# Patient Record
Sex: Female | Born: 1950 | Race: Black or African American | Hispanic: No | Smoking: Never smoker
Health system: Southern US, Community
[De-identification: ages and names within clinical notes are randomized; demographics above are authoritative.]

## PROBLEM LIST (undated history)

## (undated) DIAGNOSIS — Z9289 Personal history of other medical treatment: Secondary | ICD-10-CM

## (undated) DIAGNOSIS — Z973 Presence of spectacles and contact lenses: Secondary | ICD-10-CM

## (undated) DIAGNOSIS — I10 Essential (primary) hypertension: Secondary | ICD-10-CM

## (undated) DIAGNOSIS — Z972 Presence of dental prosthetic device (complete) (partial): Secondary | ICD-10-CM

## (undated) DIAGNOSIS — I447 Left bundle-branch block, unspecified: Secondary | ICD-10-CM

## (undated) DIAGNOSIS — D649 Anemia, unspecified: Secondary | ICD-10-CM

## (undated) DIAGNOSIS — E669 Obesity, unspecified: Secondary | ICD-10-CM

## (undated) DIAGNOSIS — R600 Localized edema: Secondary | ICD-10-CM

## (undated) DIAGNOSIS — I350 Nonrheumatic aortic (valve) stenosis: Secondary | ICD-10-CM

## (undated) DIAGNOSIS — E785 Hyperlipidemia, unspecified: Secondary | ICD-10-CM

## (undated) DIAGNOSIS — E039 Hypothyroidism, unspecified: Secondary | ICD-10-CM

## (undated) DIAGNOSIS — E010 Iodine-deficiency related diffuse (endemic) goiter: Secondary | ICD-10-CM

## (undated) HISTORY — PX: LASIK: SHX215

## (undated) HISTORY — DX: Presence of dental prosthetic device (complete) (partial): Z97.2

## (undated) HISTORY — PX: CARPAL TUNNEL RELEASE: SHX101

## (undated) HISTORY — DX: Personal history of other medical treatment: Z92.89

## (undated) HISTORY — DX: Hyperlipidemia, unspecified: E78.5

## (undated) HISTORY — DX: Localized edema: R60.0

## (undated) HISTORY — DX: Left bundle-branch block, unspecified: I44.7

## (undated) HISTORY — DX: Hypothyroidism, unspecified: E03.9

## (undated) HISTORY — DX: Nonrheumatic aortic (valve) stenosis: I35.0

## (undated) HISTORY — DX: Essential (primary) hypertension: I10

## (undated) HISTORY — DX: Obesity, unspecified: E66.9

## (undated) HISTORY — DX: Anemia, unspecified: D64.9

## (undated) HISTORY — DX: Presence of spectacles and contact lenses: Z97.3

## (undated) HISTORY — DX: Iodine-deficiency related diffuse (endemic) goiter: E01.0

## (undated) HISTORY — PX: TRANSTHORACIC ECHOCARDIOGRAM: SHX275

---

## 1999-11-12 ENCOUNTER — Ambulatory Visit (HOSPITAL_BASED_OUTPATIENT_CLINIC_OR_DEPARTMENT_OTHER): Admission: RE | Admit: 1999-11-12 | Discharge: 1999-11-12 | Payer: Self-pay | Admitting: Orthopedic Surgery

## 1999-11-26 ENCOUNTER — Encounter: Admission: RE | Admit: 1999-11-26 | Discharge: 2000-02-24 | Payer: Self-pay | Admitting: Orthopedic Surgery

## 2000-09-23 ENCOUNTER — Other Ambulatory Visit: Admission: RE | Admit: 2000-09-23 | Discharge: 2000-09-23 | Payer: Self-pay | Admitting: Family Medicine

## 2001-03-10 ENCOUNTER — Emergency Department (HOSPITAL_COMMUNITY): Admission: EM | Admit: 2001-03-10 | Discharge: 2001-03-10 | Payer: Self-pay | Admitting: Emergency Medicine

## 2002-03-23 ENCOUNTER — Emergency Department (HOSPITAL_COMMUNITY): Admission: EM | Admit: 2002-03-23 | Discharge: 2002-03-23 | Payer: Self-pay | Admitting: Emergency Medicine

## 2002-09-26 ENCOUNTER — Ambulatory Visit (HOSPITAL_COMMUNITY): Admission: RE | Admit: 2002-09-26 | Discharge: 2002-09-26 | Payer: Self-pay | Admitting: Obstetrics

## 2002-09-26 ENCOUNTER — Encounter: Payer: Self-pay | Admitting: Obstetrics

## 2003-11-07 ENCOUNTER — Ambulatory Visit (HOSPITAL_COMMUNITY): Admission: RE | Admit: 2003-11-07 | Discharge: 2003-11-07 | Payer: Self-pay | Admitting: *Deleted

## 2005-01-19 ENCOUNTER — Ambulatory Visit (HOSPITAL_COMMUNITY): Admission: RE | Admit: 2005-01-19 | Discharge: 2005-01-19 | Payer: Self-pay | Admitting: Family Medicine

## 2007-06-24 ENCOUNTER — Emergency Department (HOSPITAL_COMMUNITY): Admission: EM | Admit: 2007-06-24 | Discharge: 2007-06-24 | Payer: Self-pay | Admitting: Emergency Medicine

## 2007-06-25 ENCOUNTER — Ambulatory Visit (HOSPITAL_COMMUNITY): Admission: RE | Admit: 2007-06-25 | Discharge: 2007-06-25 | Payer: Self-pay | Admitting: Emergency Medicine

## 2007-06-25 ENCOUNTER — Ambulatory Visit: Payer: Self-pay | Admitting: Surgery

## 2007-06-25 ENCOUNTER — Encounter (INDEPENDENT_AMBULATORY_CARE_PROVIDER_SITE_OTHER): Payer: Self-pay | Admitting: Emergency Medicine

## 2007-07-13 ENCOUNTER — Encounter (INDEPENDENT_AMBULATORY_CARE_PROVIDER_SITE_OTHER): Payer: Self-pay | Admitting: Emergency Medicine

## 2007-07-13 ENCOUNTER — Ambulatory Visit: Payer: Self-pay | Admitting: Vascular Surgery

## 2007-07-13 ENCOUNTER — Emergency Department (HOSPITAL_COMMUNITY): Admission: EM | Admit: 2007-07-13 | Discharge: 2007-07-13 | Payer: Self-pay | Admitting: Emergency Medicine

## 2007-07-15 ENCOUNTER — Encounter (HOSPITAL_BASED_OUTPATIENT_CLINIC_OR_DEPARTMENT_OTHER): Admission: RE | Admit: 2007-07-15 | Discharge: 2007-09-08 | Payer: Self-pay | Admitting: Surgery

## 2007-08-09 ENCOUNTER — Ambulatory Visit: Payer: Self-pay | Admitting: Vascular Surgery

## 2007-09-26 ENCOUNTER — Ambulatory Visit: Payer: Self-pay | Admitting: Vascular Surgery

## 2007-10-04 ENCOUNTER — Ambulatory Visit: Payer: Self-pay | Admitting: Vascular Surgery

## 2008-02-24 DIAGNOSIS — I350 Nonrheumatic aortic (valve) stenosis: Secondary | ICD-10-CM

## 2008-02-24 HISTORY — PX: VEIN SURGERY: SHX48

## 2008-02-24 HISTORY — DX: Nonrheumatic aortic (valve) stenosis: I35.0

## 2008-04-20 ENCOUNTER — Ambulatory Visit: Payer: Self-pay | Admitting: Family Medicine

## 2008-04-23 ENCOUNTER — Ambulatory Visit (HOSPITAL_COMMUNITY): Admission: RE | Admit: 2008-04-23 | Discharge: 2008-04-23 | Payer: Self-pay | Admitting: Family Medicine

## 2008-04-23 DIAGNOSIS — E010 Iodine-deficiency related diffuse (endemic) goiter: Secondary | ICD-10-CM

## 2008-04-23 HISTORY — DX: Iodine-deficiency related diffuse (endemic) goiter: E01.0

## 2008-04-26 ENCOUNTER — Ambulatory Visit: Payer: Self-pay | Admitting: Family Medicine

## 2008-05-01 ENCOUNTER — Ambulatory Visit: Payer: Self-pay | Admitting: Cardiology

## 2008-05-01 ENCOUNTER — Encounter: Payer: Self-pay | Admitting: Cardiology

## 2008-05-01 DIAGNOSIS — R072 Precordial pain: Secondary | ICD-10-CM | POA: Insufficient documentation

## 2008-05-01 DIAGNOSIS — I10 Essential (primary) hypertension: Secondary | ICD-10-CM | POA: Insufficient documentation

## 2008-05-01 DIAGNOSIS — I447 Left bundle-branch block, unspecified: Secondary | ICD-10-CM | POA: Insufficient documentation

## 2008-05-01 DIAGNOSIS — E7800 Pure hypercholesterolemia, unspecified: Secondary | ICD-10-CM | POA: Insufficient documentation

## 2008-05-01 DIAGNOSIS — R011 Cardiac murmur, unspecified: Secondary | ICD-10-CM | POA: Insufficient documentation

## 2008-05-07 ENCOUNTER — Ambulatory Visit: Payer: Self-pay | Admitting: Vascular Surgery

## 2008-05-14 ENCOUNTER — Encounter: Payer: Self-pay | Admitting: Cardiology

## 2008-05-14 ENCOUNTER — Ambulatory Visit: Payer: Self-pay

## 2008-06-19 DIAGNOSIS — I83009 Varicose veins of unspecified lower extremity with ulcer of unspecified site: Secondary | ICD-10-CM | POA: Insufficient documentation

## 2008-06-19 DIAGNOSIS — L97909 Non-pressure chronic ulcer of unspecified part of unspecified lower leg with unspecified severity: Secondary | ICD-10-CM

## 2008-06-19 DIAGNOSIS — E039 Hypothyroidism, unspecified: Secondary | ICD-10-CM | POA: Insufficient documentation

## 2008-07-30 ENCOUNTER — Ambulatory Visit: Payer: Self-pay | Admitting: Vascular Surgery

## 2008-08-06 ENCOUNTER — Ambulatory Visit: Payer: Self-pay | Admitting: Vascular Surgery

## 2009-02-05 ENCOUNTER — Ambulatory Visit: Payer: Self-pay | Admitting: Vascular Surgery

## 2009-04-19 ENCOUNTER — Ambulatory Visit: Payer: Self-pay | Admitting: Family Medicine

## 2009-04-24 ENCOUNTER — Ambulatory Visit: Payer: Self-pay | Admitting: Family Medicine

## 2009-07-15 ENCOUNTER — Encounter: Admission: RE | Admit: 2009-07-15 | Discharge: 2009-07-15 | Payer: Self-pay | Admitting: Gastroenterology

## 2009-07-23 ENCOUNTER — Ambulatory Visit: Payer: Self-pay | Admitting: Family Medicine

## 2009-08-09 ENCOUNTER — Ambulatory Visit: Payer: Self-pay | Admitting: Cardiology

## 2009-08-13 ENCOUNTER — Encounter: Payer: Self-pay | Admitting: Cardiology

## 2009-08-13 ENCOUNTER — Inpatient Hospital Stay (HOSPITAL_COMMUNITY): Admission: RE | Admit: 2009-08-13 | Discharge: 2009-08-18 | Payer: Self-pay | Admitting: General Surgery

## 2009-08-13 ENCOUNTER — Encounter (INDEPENDENT_AMBULATORY_CARE_PROVIDER_SITE_OTHER): Payer: Self-pay | Admitting: General Surgery

## 2009-08-13 HISTORY — PX: COLECTOMY: SHX59

## 2009-11-25 ENCOUNTER — Ambulatory Visit: Payer: Self-pay | Admitting: Family Medicine

## 2009-12-28 IMAGING — US US SOFT TISSUE HEAD/NECK
1 series · 14 of 25 positions shown · non-contrast
Comparison: None.

CLINICAL DATA: Thyromegaly.

THYROID ULTRASOUND
TECHNIQUE: Ultrasound examination of the thyroid gland and
adjacent soft tissues was performed.

[Series 1: us soft tissue head/neck · 0.11mm/px · 14 of 32 slices shown]
[im 1/32]
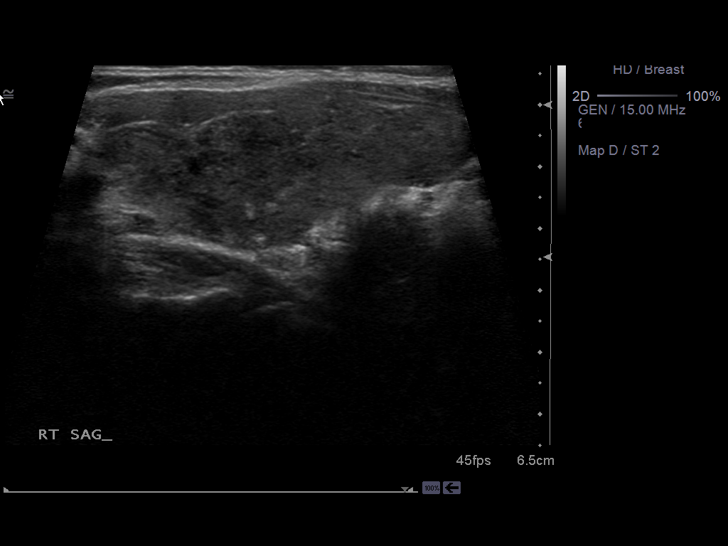
[im 3/32]
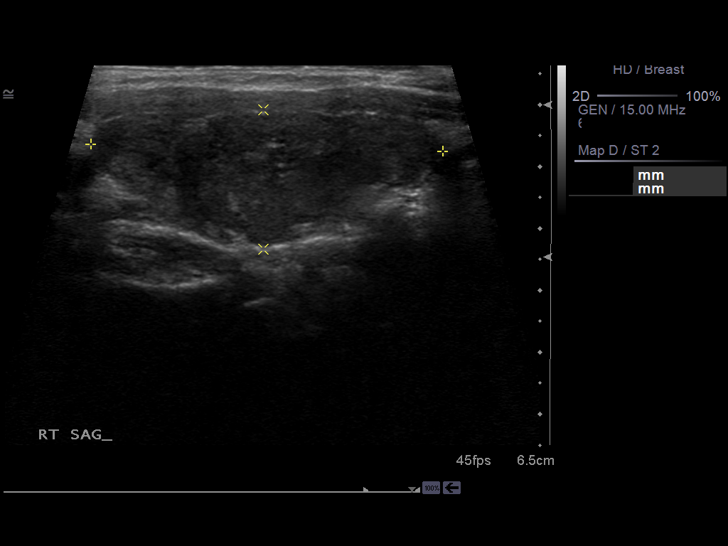
[im 6/32]
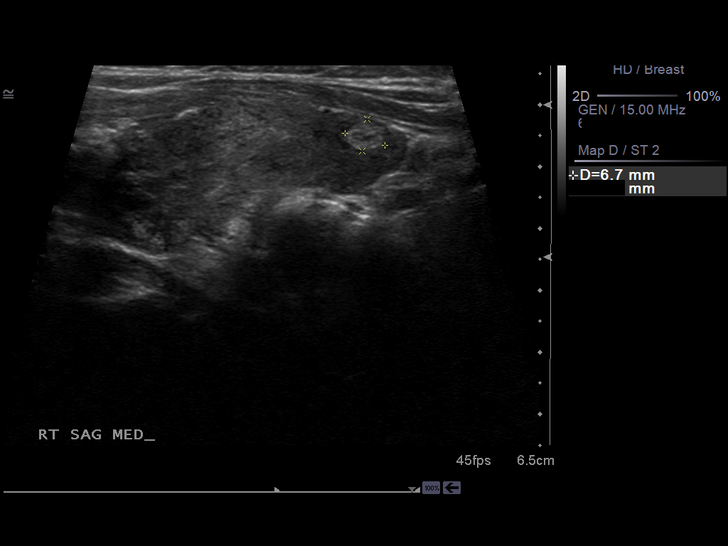
[im 8/32]
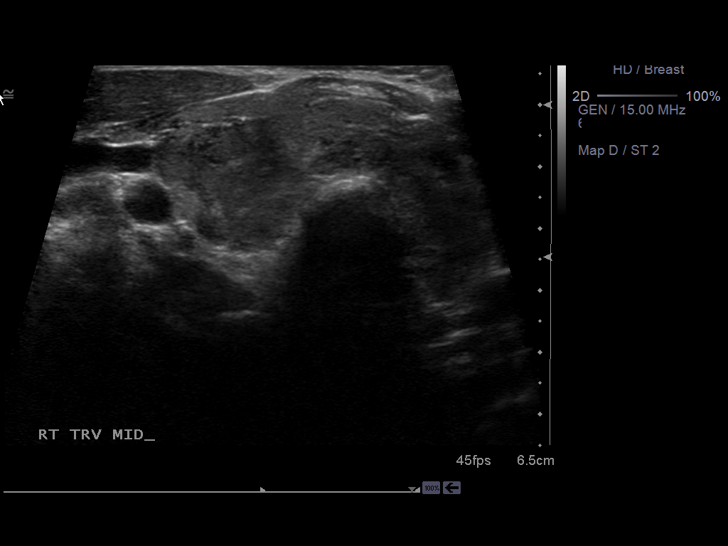
[im 11/32]
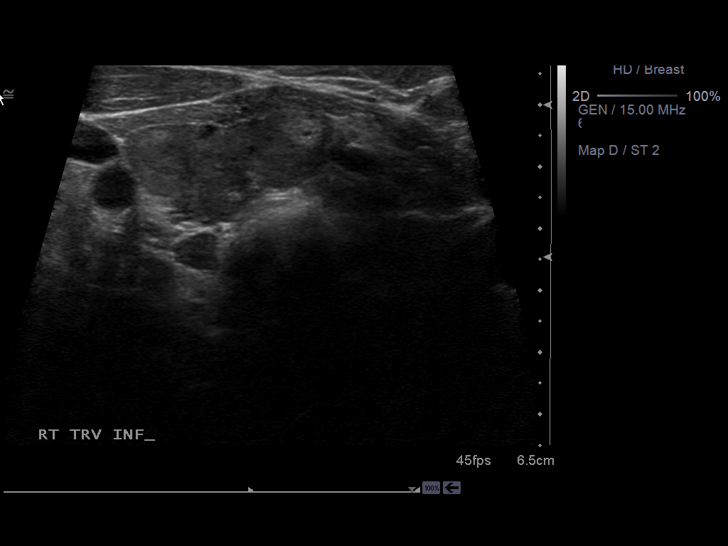
[im 12/32]
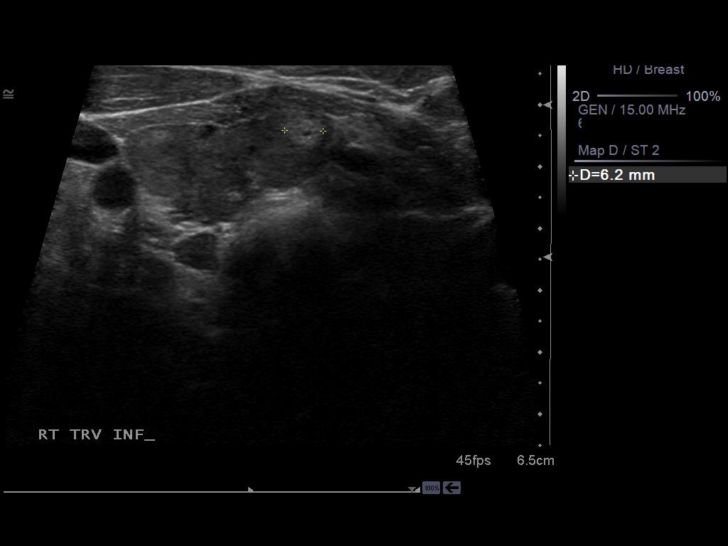
[im 15/32]
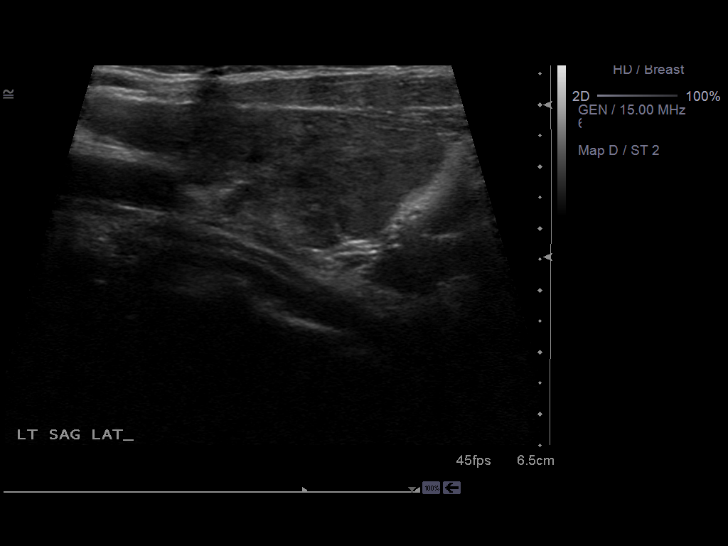
[im 17/32]
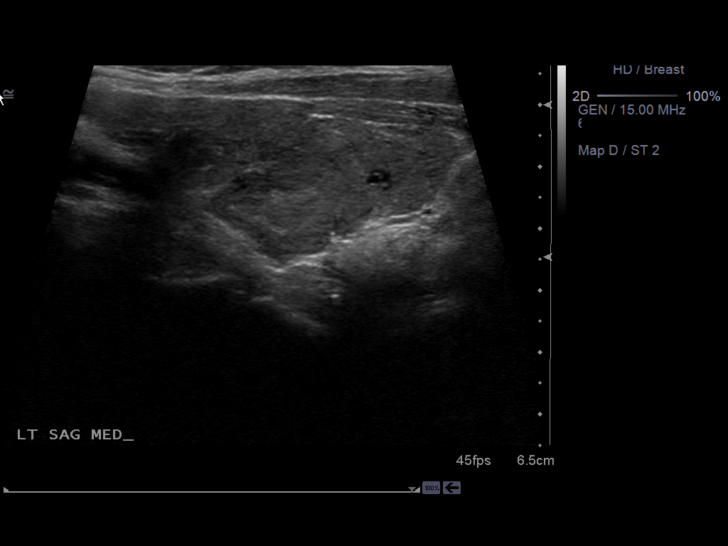
[im 20/32]
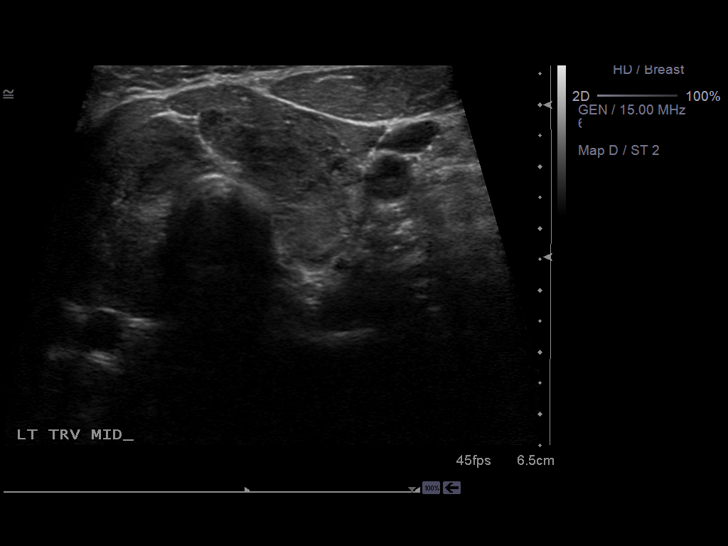
[im 21/32]
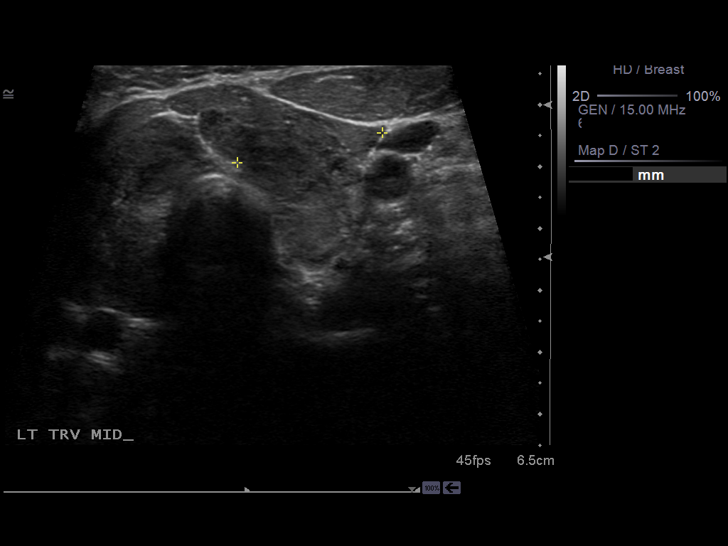
[im 24/32]
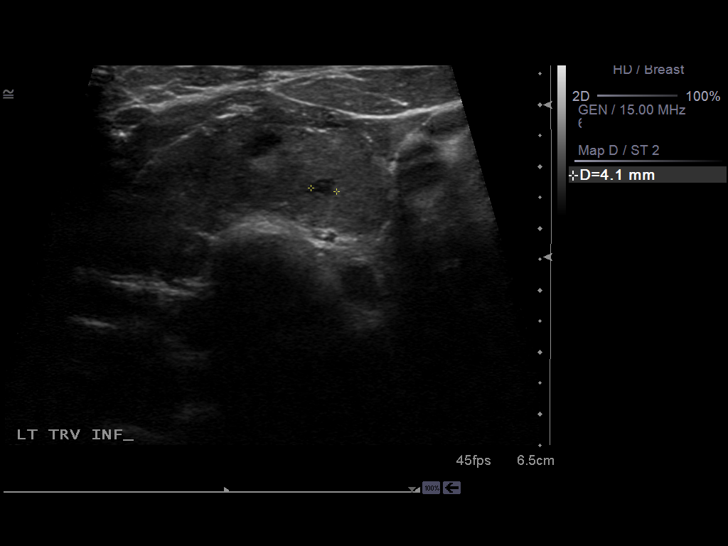
[im 26/32]
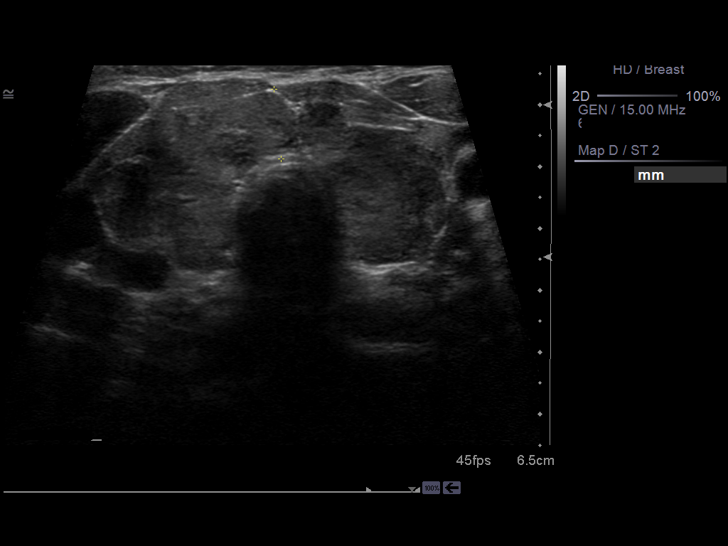
[im 29/32]
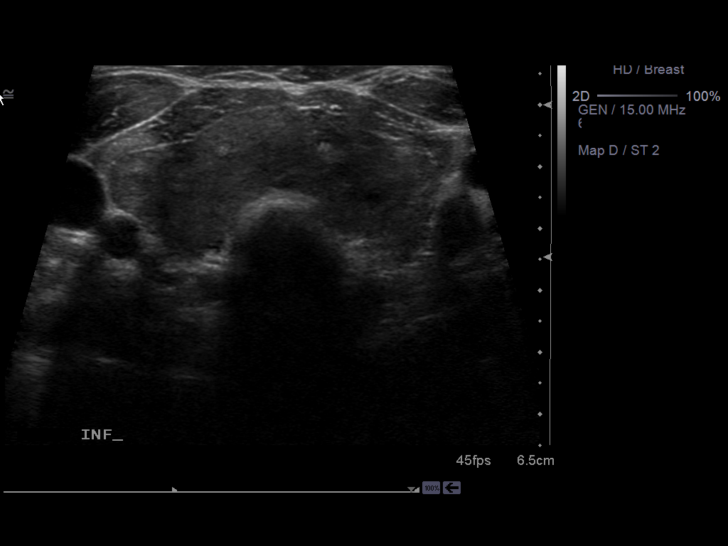
[im 32/32]
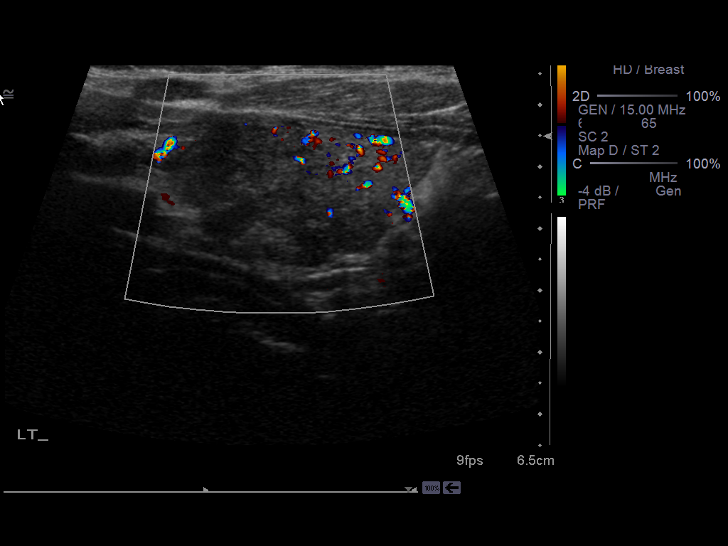

[14 of 25 positions shown; findings below may reference images not displayed]

FINDINGS: Right lobe of the thyroid gland measures 5.6 x 2.3 x 2.7 cm.

Left lobe of the thyroid gland measures 4.6 x 2.5 x 2.4 cm.

Diffusely heterogeneous echotexture of the thyroid gland.

Within the inferior medial aspect of the right lobe of the thyroid
gland is an echogenic 6.7 x 5.2 x 6.2 mm structure.

Within the inferior aspect of the left lobe of the thyroid gland is
a complex 4.6 x 3.4 x 4.1 cystic-appearing structure.

To help confirm stability of these findings, follow-up imaging in 6
months recommended.

Isthmus measures 1.5 cm.
IMPRESSION: Enlarged heterogeneous thyroid gland.  Dominant sub centimeter
lesion within the inferior aspect of each lobe of thyroid gland as
noted above.  Stability can be confirmed on follow-up in 6 months.

## 2010-03-16 ENCOUNTER — Encounter: Payer: Self-pay | Admitting: Family Medicine

## 2010-03-25 NOTE — Assessment & Plan Note (Signed)
History of Present Illness: 60 year old female who I am asked to evaluate for left bundle branch block and murmur. The patient has been told she's had a murmur in the past. She has dyspnea with more extreme activities but not with routine activities. There is no orthopnea or PND. She occasionally has mild edema in the right lower extremity from venous insufficiency but not in her left lower extremity. She also occasionally has a pain in her chest and left breast area. It is described as a "staining sensation". It is not exertional nor is it pleuritic or positional. It is not related to food. There is no associated nausea vomiting, shortness of breath or diaphoresis. It lasts for one minute and resolve spontaneously. She does not have exertional chest pain. She was recently noted to have a murmur on examination and left bundle branch block on her electrocardiogram and we were asked to further evaluate.  Current Medications (verified): 1)  Lisinopril-Hydrochlorothiazide 10-12.5 Mg Tabs (Lisinopril-Hydrochlorothiazide) .Marland Kitchen.. 1 Tab By Mouth Once Daily 2)  Levothyroxine Sodium 100 Mcg Tabs (Levothyroxine Sodium) .Marland Kitchen.. 1 Tab By Mouth Once Daily  Past Medical History:    Borderline DM    Hypertension    Hypothyroid    Murmur  Past Surgical History:    Carpel tunnel surgery bilaterally    Vein surgery RLE    Tonsillectomy  Family History:    Sisters with murmur    Mother died of MI at age 46    Father died of cancer  Social History:    Married     Tobacco Use - No.     Alcohol Use - no    5 children    employed by [employer]    Smoking Status:  never  Review of Systems       Patient denies fevers, chills, productive cough, hemoptysis, dysphasia, odynophagia, melena, hematochezia, dysuria, hematuria. She does have edema in the right lower extremity which is chronic and due to venous insufficiency. The remaining systems are negative.  Vital Signs:  Patient profile:   60 year old female  Weight:      261 pounds Pulse rate:   84 / minute Pulse rhythm:   regular BP sitting:   113 / 77  (left arm)  Vitals Entered By: Kem Parkinson (May 01, 2008 2:39 PM)  Problems:  Medical Problems Added: 1)  Dx of Pure Hypercholesterolemia  (ICD-272.0) 2)  Dx of Essential Hypertension, Benign  (ICD-401.1) 3)  Dx of Chest Pain, Precordial  (ICD-786.51) 4)  Dx of Lbbb  (ICD-426.3) 5)  Dx of Murmur  (ICD-785.2)  Physical Exam  General:  Well developed/obese in no acute distress Skin warm/dry Patient not depressed No peripheral clubbing Back-normal HEENT-normal/normal eyelids Neck supple/normal carotid upstroke bilaterally; no bruits; no JVD; no thyromegaly chest - CTA/ normal expansion CV - RRR/normal S1 and S2; 2/6 systolic murmur radiating to the carotids,no rubs or gallops; no rub; PMI nondisplaced Abdomen -NT/ND, no HSM, no mass, + bowel sounds, no bruit 2+ femoral pulses, no bruits Ext-1+ edema right lower extremity, no chords, 2+ DP Neuro-grossly nonfocal     EKG  Procedure date:  05/01/2008  Findings:      normal sinus rhythm with left bundle branch block.  Impression & Recommendations:  Problem # 1:  MURMUR (ICD-785.2) The patient appears to have an aortic stenosis murmur on examination. However it does sound mild. We will plan to proceed with an echocardiogram to more fully evaluate. Her updated medication  list for this problem includes:    Lisinopril-hydrochlorothiazide 10-12.5 Mg Tabs (Lisinopril-hydrochlorothiazide) .Marland Kitchen... 1 tab by mouth once daily  Problem # 2:  CHEST PAIN, PRECORDIAL (ICD-786.51) The patient's chest pain is atypical. We will schedule her to have a Myoview for risk stratification Her updated medication list for this problem includes:    Lisinopril-hydrochlorothiazide 10-12.5 Mg Tabs (Lisinopril-hydrochlorothiazide) .Marland Kitchen... 1 tab by mouth once daily  Problem # 3:  ESSENTIAL HYPERTENSION, BENIGN (ICD-401.1) The patient's blood pressure  is controlled on her present medications. Her updated medication list for this problem includes:    Lisinopril-hydrochlorothiazide 10-12.5 Mg Tabs (Lisinopril-hydrochlorothiazide) .Marland Kitchen... 1 tab by mouth once daily  Problem # 4:  PURE HYPERCHOLESTEROLEMIA (ICD-272.0) Management per her primary care physician.  Problem # 5:  LBBB (ICD-426.3) We are scheduling a Myoview for risk stratification. Her updated medication list for this problem includes:    Lisinopril-hydrochlorothiazide 10-12.5 Mg Tabs (Lisinopril-hydrochlorothiazide) .Marland Kitchen... 1 tab by mouth once daily  Patient Instructions: 1)  The patient will followup with me in 6 weeks.

## 2010-03-25 NOTE — Letter (Signed)
Summary: [facility] - Surgical Instructions  [facility] - Surgical Instructions   Imported By: Marylou Mccoy 09/03/2009 09:18:56  _____________________________________________________________________  External Attachment:    Type:   Image     Comment:   External Document

## 2010-03-25 NOTE — Assessment & Plan Note (Signed)
Summary: surgical clearance pt of Kathryn [name]   Visit Type:  Pre-op Evaluation Primary Provider:  dr Susann Givens   History of Present Illness: The patient is 60 years old and comes in for preoperative evaluation prior to colon surgery next week. She apparently has a polyp that is to be to be removed endoscopically. She is scheduled for laparoscopic surgery by Dr. Andrey Campanile next week.  She has been seen by Dr. Jens Som in the past. She was evaluated for a heart murmur and had what was thought to be atypical chest pain. She had an echocardiogram which showed an ejection fraction of 60% and a mean aortic valve gradient of 7 mm of mercury.  Her other medical problems include hypertension and hyperlipidemia although she's not been on any statin therapy. She has had no recent chest pain. He has had some mild palpitations.  Current Medications (verified): 1)  Lisinopril-Hydrochlorothiazide 10-12.5 Mg Tabs (Lisinopril-Hydrochlorothiazide) .Marland Kitchen.. 1 Tab By Mouth Once Daily 2)  Levothyroxine Sodium 100 Mcg Tabs (Levothyroxine Sodium) .Marland Kitchen.. 1 Tab By Mouth Once Daily 3)  Mupirocin 2 % Oint (Mupirocin) .... Both Nostrils Twice A Day  Allergies (verified): 1)  ! Codeine  Past History:  Past Medical History: Reviewed history from 06/19/2008 and no changes required. Current Problems:  MURMUR (ICD-785.2) CHEST PAIN, PRECORDIAL (ICD-786.51) ESSENTIAL HYPERTENSION, BENIGN (ICD-401.1) STASIS ULCER (ICD-454.0) HYPOTHYROIDISM (ICD-244.9) PURE HYPERCHOLESTEROLEMIA (ICD-272.0) LBBB (ICD-426.3)  Borderline DM hyperpigmentation ulceration and pain in the leg prior to her  procedure.  Cellulitis of  swelling   right lower leg   Review of Systems       ROS is negative except as outlined in HPI.   Vital Signs:  Patient profile:   60 year old female Height:      67 inches Weight:      232 pounds BMI:     36.47 Pulse rate:   71 / minute BP sitting:   122 / 76  (left arm) Cuff size:   large  Vitals Entered  By: Burnett Kanaris, CNA (August 09, 2009 4:49 PM)  Physical Exam  Additional Exam:  Gen. Well-nourished, in no distress Head: Normocephalic    Eyes: PERRLA/EOM intact; conjunctiva and lids normal Neck: No JVD, thyroid not enlarged, no carotid bruits Lungs: No tachypnea, clear w/o rales, rhonchi or wheezes CV: Rhythm regular, PMI not displaced,  sounds  nl, no murmurs or gallops, 1+ bilateral lower extremity edema with some stasis changes, pulses nl UE and LE Abd: BS nl, abd soft and non-tender w/o masses or hepatosplenomegaly MS: No deformities Neuro: no focal sns Skin: no lesions Psych: nl affect    Impression & Recommendations:  Problem # 1:  PREOPERATIVE EXAMINATION (ICD-V72.84) She is scheduled for laparoscopic partial colectomy next week. She has very mild aortic stenosis. She also has hypertension. There is no evidence of ischemic heart disease and she's not having any chest pain. I think her cardiac risk for surgery should be low and I think it is reasonable to proceed.  Her ECG shows left bundle branch block.  Problem # 2:  MURMUR (ICD-785.2) She has very mild aortic stenosis. I don't think she needs any treatment or special precautions regarding this. Her updated medication list for this problem includes:    Lisinopril-hydrochlorothiazide 10-12.5 Mg Tabs (Lisinopril-hydrochlorothiazide) .Marland Kitchen... 1 tab by mouth once daily  Her updated medication list for this problem includes:    Lisinopril-hydrochlorothiazide 10-12.5 Mg Tabs (Lisinopril-hydrochlorothiazide) .Marland Kitchen... 1 tab by mouth once daily  Problem # 3:  ESSENTIAL HYPERTENSION, BENIGN (  ICD-401.1) Her blood pressure is well controlled on current medications. Her updated medication list for this problem includes:    Lisinopril-hydrochlorothiazide 10-12.5 Mg Tabs (Lisinopril-hydrochlorothiazide) .Marland Kitchen... 1 tab by mouth once daily  Her updated medication list for this problem includes:    Lisinopril-hydrochlorothiazide 10-12.5 Mg  Tabs (Lisinopril-hydrochlorothiazide) .Marland Kitchen... 1 tab by mouth once daily  Other Orders: EKG w/ Interpretation (93000)  Patient Instructions: 1)  Your physician recommends that you schedule a follow-up appointment in: 12 months with Dr. Jens Som. The office will mail you a reminder letter 2 months prior appointment ndate.

## 2010-05-11 LAB — TYPE AND SCREEN
ABO/RH(D): B POS
Antibody Screen: NEGATIVE

## 2010-05-11 LAB — GLUCOSE, CAPILLARY
Glucose-Capillary: 100 mg/dL — ABNORMAL HIGH (ref 70–99)
Glucose-Capillary: 102 mg/dL — ABNORMAL HIGH (ref 70–99)
Glucose-Capillary: 108 mg/dL — ABNORMAL HIGH (ref 70–99)
Glucose-Capillary: 109 mg/dL — ABNORMAL HIGH (ref 70–99)
Glucose-Capillary: 111 mg/dL — ABNORMAL HIGH (ref 70–99)
Glucose-Capillary: 112 mg/dL — ABNORMAL HIGH (ref 70–99)
Glucose-Capillary: 112 mg/dL — ABNORMAL HIGH (ref 70–99)
Glucose-Capillary: 112 mg/dL — ABNORMAL HIGH (ref 70–99)
Glucose-Capillary: 113 mg/dL — ABNORMAL HIGH (ref 70–99)
Glucose-Capillary: 114 mg/dL — ABNORMAL HIGH (ref 70–99)
Glucose-Capillary: 114 mg/dL — ABNORMAL HIGH (ref 70–99)
Glucose-Capillary: 117 mg/dL — ABNORMAL HIGH (ref 70–99)
Glucose-Capillary: 118 mg/dL — ABNORMAL HIGH (ref 70–99)
Glucose-Capillary: 119 mg/dL — ABNORMAL HIGH (ref 70–99)
Glucose-Capillary: 119 mg/dL — ABNORMAL HIGH (ref 70–99)
Glucose-Capillary: 121 mg/dL — ABNORMAL HIGH (ref 70–99)
Glucose-Capillary: 121 mg/dL — ABNORMAL HIGH (ref 70–99)
Glucose-Capillary: 123 mg/dL — ABNORMAL HIGH (ref 70–99)
Glucose-Capillary: 127 mg/dL — ABNORMAL HIGH (ref 70–99)
Glucose-Capillary: 127 mg/dL — ABNORMAL HIGH (ref 70–99)
Glucose-Capillary: 130 mg/dL — ABNORMAL HIGH (ref 70–99)
Glucose-Capillary: 133 mg/dL — ABNORMAL HIGH (ref 70–99)
Glucose-Capillary: 140 mg/dL — ABNORMAL HIGH (ref 70–99)
Glucose-Capillary: 145 mg/dL — ABNORMAL HIGH (ref 70–99)
Glucose-Capillary: 147 mg/dL — ABNORMAL HIGH (ref 70–99)
Glucose-Capillary: 151 mg/dL — ABNORMAL HIGH (ref 70–99)
Glucose-Capillary: 157 mg/dL — ABNORMAL HIGH (ref 70–99)
Glucose-Capillary: 94 mg/dL (ref 70–99)
Glucose-Capillary: 94 mg/dL (ref 70–99)

## 2010-05-11 LAB — ABO/RH: ABO/RH(D): B POS

## 2010-05-11 LAB — BASIC METABOLIC PANEL
BUN: 5 mg/dL — ABNORMAL LOW (ref 6–23)
BUN: 5 mg/dL — ABNORMAL LOW (ref 6–23)
BUN: 6 mg/dL (ref 6–23)
BUN: 7 mg/dL (ref 6–23)
CO2: 25 mEq/L (ref 19–32)
CO2: 27 mEq/L (ref 19–32)
CO2: 27 mEq/L (ref 19–32)
CO2: 28 mEq/L (ref 19–32)
Calcium: 8.6 mg/dL (ref 8.4–10.5)
Calcium: 8.6 mg/dL (ref 8.4–10.5)
Calcium: 8.9 mg/dL (ref 8.4–10.5)
Calcium: 9.2 mg/dL (ref 8.4–10.5)
Chloride: 105 mEq/L (ref 96–112)
Chloride: 108 mEq/L (ref 96–112)
Chloride: 108 mEq/L (ref 96–112)
Chloride: 108 mEq/L (ref 96–112)
Creatinine, Ser: 0.72 mg/dL (ref 0.4–1.2)
Creatinine, Ser: 0.73 mg/dL (ref 0.4–1.2)
Creatinine, Ser: 0.76 mg/dL (ref 0.4–1.2)
Creatinine, Ser: 0.83 mg/dL (ref 0.4–1.2)
GFR calc Af Amer: >60 mL/min (ref >60)
GFR calc Af Amer: >60 mL/min (ref >60)
GFR calc Af Amer: >60 mL/min (ref >60)
GFR calc Af Amer: >60 mL/min (ref >60)
GFR calc non Af Amer: >60 mL/min (ref >60)
GFR calc non Af Amer: >60 mL/min (ref >60)
GFR calc non Af Amer: >60 mL/min (ref >60)
GFR calc non Af Amer: >60 mL/min (ref >60)
Glucose, Bld: 112 mg/dL — ABNORMAL HIGH (ref 70–99)
Glucose, Bld: 113 mg/dL — ABNORMAL HIGH (ref 70–99)
Glucose, Bld: 129 mg/dL — ABNORMAL HIGH (ref 70–99)
Glucose, Bld: 138 mg/dL — ABNORMAL HIGH (ref 70–99)
Potassium: 3.5 mEq/L (ref 3.5–5.1)
Potassium: 3.5 mEq/L (ref 3.5–5.1)
Potassium: 3.7 mEq/L (ref 3.5–5.1)
Potassium: 3.9 mEq/L (ref 3.5–5.1)
Sodium: 138 mEq/L (ref 135–145)
Sodium: 139 mEq/L (ref 135–145)
Sodium: 141 mEq/L (ref 135–145)
Sodium: 141 mEq/L (ref 135–145)

## 2010-05-11 LAB — CBC
HCT: 28.4 % — ABNORMAL LOW (ref 36.0–46.0)
HCT: 28.6 % — ABNORMAL LOW (ref 36.0–46.0)
HCT: 35.7 % — ABNORMAL LOW (ref 36.0–46.0)
Hemoglobin: 11.2 g/dL — ABNORMAL LOW (ref 12.0–15.0)
Hemoglobin: 9 g/dL — ABNORMAL LOW (ref 12.0–15.0)
Hemoglobin: 9.1 g/dL — ABNORMAL LOW (ref 12.0–15.0)
MCH: 26.2 pg (ref 26.0–34.0)
MCHC: 31.4 g/dL (ref 30.0–36.0)
MCHC: 31.6 g/dL (ref 30.0–36.0)
MCHC: 31.7 g/dL (ref 30.0–36.0)
MCV: 82.4 fL (ref 78.0–100.0)
MCV: 83.1 fL (ref 78.0–100.0)
MCV: 83.3 fL (ref 78.0–100.0)
Platelets: 173 10*3/uL (ref 150–400)
Platelets: 193 10*3/uL (ref 150–400)
Platelets: 196 10*3/uL (ref 150–400)
RBC: 3.41 MIL/uL — ABNORMAL LOW (ref 3.87–5.11)
RBC: 3.47 MIL/uL — ABNORMAL LOW (ref 3.87–5.11)
RBC: 4.29 MIL/uL (ref 3.87–5.11)
RDW: 14.3 % (ref 11.5–15.5)
RDW: 14.3 % (ref 11.5–15.5)
RDW: 14.6 % (ref 11.5–15.5)
WBC: 6.6 10*3/uL (ref 4.0–10.5)
WBC: 6.6 10*3/uL (ref 4.0–10.5)
WBC: 9.2 10*3/uL (ref 4.0–10.5)

## 2010-05-11 LAB — MAGNESIUM
Magnesium: 1.9 mg/dL (ref 1.5–2.5)
Magnesium: 2 mg/dL (ref 1.5–2.5)

## 2010-05-11 LAB — HEMOGLOBIN A1C
Hgb A1c MFr Bld: 6.3 % — ABNORMAL HIGH (ref <5.7)
Mean Plasma Glucose: 134 mg/dL — ABNORMAL HIGH (ref <117)

## 2010-05-12 LAB — COMPREHENSIVE METABOLIC PANEL
ALT: 25 U/L (ref 0–35)
AST: 24 U/L (ref 0–37)
Albumin: 4.2 g/dL (ref 3.5–5.2)
Alkaline Phosphatase: 79 U/L (ref 39–117)
BUN: 12 mg/dL (ref 6–23)
CO2: 30 mEq/L (ref 19–32)
Calcium: 9.9 mg/dL (ref 8.4–10.5)
Chloride: 105 mEq/L (ref 96–112)
Creatinine, Ser: 0.78 mg/dL (ref 0.4–1.2)
GFR calc Af Amer: >60 mL/min (ref >60)
GFR calc non Af Amer: >60 mL/min (ref >60)
Glucose, Bld: 86 mg/dL (ref 70–99)
Potassium: 3.9 mEq/L (ref 3.5–5.1)
Sodium: 141 mEq/L (ref 135–145)
Total Bilirubin: 0.6 mg/dL (ref 0.3–1.2)
Total Protein: 7.8 g/dL (ref 6.0–8.3)

## 2010-05-12 LAB — DIFFERENTIAL
Basophils Absolute: 0 10*3/uL (ref 0.0–0.1)
Basophils Relative: 1 % (ref 0–1)
Eosinophils Absolute: 0.1 10*3/uL (ref 0.0–0.7)
Eosinophils Relative: 2 % (ref 0–5)
Lymphocytes Relative: 47 % — ABNORMAL HIGH (ref 12–46)
Lymphs Abs: 2.3 10*3/uL (ref 0.7–4.0)
Monocytes Absolute: 0.4 10*3/uL (ref 0.1–1.0)
Monocytes Relative: 8 % (ref 3–12)
Neutro Abs: 2.1 10*3/uL (ref 1.7–7.7)
Neutrophils Relative %: 43 % (ref 43–77)

## 2010-05-12 LAB — CBC
HCT: 37.8 % (ref 36.0–46.0)
Hemoglobin: 12.2 g/dL (ref 12.0–15.0)
MCHC: 32.3 g/dL (ref 30.0–36.0)
MCV: 82.3 fL (ref 78.0–100.0)
Platelets: 232 10*3/uL (ref 150–400)
RBC: 4.59 MIL/uL (ref 3.87–5.11)
RDW: 14.2 % (ref 11.5–15.5)
WBC: 4.9 10*3/uL (ref 4.0–10.5)

## 2010-05-12 LAB — SURGICAL PCR SCREEN
MRSA, PCR: POSITIVE — AB
Staphylococcus aureus: POSITIVE — AB

## 2010-07-08 NOTE — Procedures (Signed)
LOWER EXTREMITY VENOUS REFLUX EXAM   INDICATION:  Followup right lower extremity greater saphenous vein  ablation x2.   EXAM:  Using color-flow imaging and pulse Doppler spectral analysis, the  right common femoral, superficial femoral, popliteal, posterior tibial,  greater and lesser saphenous veins were evaluated.  There is no evidence  suggesting deep venous insufficiency in the right lower extremity.   The right saphenofemoral junction is not competent with Reflux of >500  milliseconds. The right GSV is not competent with Reflux of >500  milliseconds in a short segment proximally as well as in calf.  Most of  the thigh area GSV is not visualized status post ablation.   The right proximal short saphenous vein demonstrates competency.   GSV Diameter (used if found to be incompetent only)                                            Right    Left  Proximal Greater Saphenous Vein           0.56 cm  cm  Proximal-to-mid-thigh                     cm       cm  Mid thigh                                 cm       cm  Mid-distal thigh                          cm       cm  Distal thigh                              cm       cm  Knee                                      cm       cm   IMPRESSION:  1. Right greater saphenous vein Reflux with >500 milliseconds is      identified in the visualized segments with the majority of the      thigh segment not visualized due to ablation.  2. The deep venous system is competent.  3. The right lesser saphenous vein is competent.   ___________________________________________  [name]. Hart Rochester, M.D.   AS/MEDQ  D:  02/05/2009  T:  02/06/2009  Job:  [id number]

## 2010-07-08 NOTE — Assessment & Plan Note (Signed)
OFFICE VISIT   Kathryn [name], Kathryn [name]  DOB:  13-Jun-1950                                       10/04/2007  [id number]#:[id number]   The patient had laser ablation of her right great saphenous vein 1 week  ago for severe venous insufficiency of the right leg with history of  stasis ulcers.  She had chronic edema in the right leg and had gross  reflux throughout the right great saphenous system.   She has done well since her procedure.  She had some moderate discomfort  for 24 hours at the ablation site of the thigh in the right leg but  after that the discomfort abated fairly rapidly.  She has had no change  in her distal edema but the aching and throbbing discomfort in the calf  and thigh seems to be improving already.   On examination today there is no edema except for 1+ distally.  She  continues to have the hyperpigmentation in the lower third of the leg  with a heel ulcer.  She has palpable dorsalis pedis pulse 2-3+, no  tenderness over the great saphenous vein.  Venous duplex exam today  reveals total occlusion of the right great saphenous vein from the  saphenofemoral junction to the distal thigh.  I reassured her regarding  these findings and we will see her back on a p.r.n. basis.   [name] [place], M.D.  Electronically Signed   JDL/MEDQ  D:  10/04/2007  T:  10/05/2007  Job:  [id number]

## 2010-07-08 NOTE — Assessment & Plan Note (Signed)
[facility]   NAME:  Kathryn, [name]            ACCOUNT NO.:  [id number]   MEDICAL RECORD NO.:  [id number]      DATE OF BIRTH:  [birth year]   PHYSICIAN:  Jake Shark A. Tanda Rockers, M.D. VISIT DATE:  07/26/2007                                   OFFICE VISIT   SUBJECTIVE:  Kathryn [name] is a 60 year old female who we are treating  for stasis ulcer involving her right medial lower extremity.  In the  interim, she has continued to work.  She is exercising with periodic  elevations of the leg and continues to wear the compression wrap.  She  is complaining of pain in the posterior popliteal area.   OBJECTIVE:  Blood pressure is 140/87, respirations 18, pulse rate 70,  temperature is 98.5.  Inspection of the right lower extremity shows that  there is a healthy eschar covering the entire wound.  There is no  evidence of drainage or malodor.  The chronic changes of stasis are  persistent.  The dorsalis pedis pulses are readily palpable and judged  at 3+.  There is no evidence of ascending infection.   ASSESSMENT:  Near resolved stasis ulcer.   PLAN:  We are referring the patient for vascular consultation for  consideration of vein ablation or stripping of symptomatic varicose  veins.  In addition, we will return her to a Profore wrap with  triamcinolone ointment.  We will reevaluate her in 1 week.  We have  given her a prescription for bilateral 30-40 mm below-the-knee external  compression hose.  We have given the patient opportunity to ask  questions.  She seems to understand these instructions and indicates  that she will be compliant.      [name], M.D.  Electronically Signed     HAN/MEDQ  D:  07/26/2007  T:  07/26/2007  Job:  [id number]

## 2010-07-08 NOTE — Procedures (Signed)
DUPLEX DEEP VENOUS EXAM - LOWER EXTREMITY   INDICATION:  Postop lower extremity venous ablation.   HISTORY:  Edema:  Diabetes:  No.  Trauma/Surgery:  Right greater saphenous vein ablation on 07/30/2008 by  Dr. Hart Rochester.  Pain:  No.  PE:  No.  Previous DVT:  No.  Anticoagulants:  No.  Other:   DUPLEX EXAM:                CFV   SFV   PopV  PTV    GSV                R  L  R  L  R  L  R   L  R  L  Thrombosis    o  o  o     o     o      +  Spontaneous   +  +  +     +     +      0  Phasic        +  +  +     +     +      0  Augmentation  +  +  +     +     +      0  Compressible  +  +  +     +     +      0  Competent     +  +  +     +     +   Legend:  + - yes  o - no  p - partial  D - decreased   IMPRESSION:  1. No evidence of deep vein thrombosis in right lower extremity.  2. Right greater saphenous vein appears ablated from groin to knee      level.    _____________________________  [name]. Hart Rochester, M.D.   AC/MEDQ  D:  08/06/2008  T:  08/06/2008  Job:  [id number]

## 2010-07-08 NOTE — Assessment & Plan Note (Signed)
OFFICE VISIT   Kathryn, MONTE [name]  DOB:  20-Jun-1950                                       02/05/2009  [id number]#:[id number]   [name] is a 60 year old female seen in followup for severe venous  insufficiency of the right leg.  She had initially a laser ablation of  the right great saphenous vein August 2009.  She returned with more  symptoms in June 2010 and had a repeat laser ablation because she has  recanalized her right great saphenous vein and also had stab  phlebectomies performed.  She states that she has been having some  increasing edema in the right ankle and calf as well as darkening of the  skin and was re-evaluated today.  She is wearing elastic compression  stockings to some degree, but not always putting them on early in the  morning apparently.  Left leg has no severe symptoms.   On physical exam, blood pressure 160/80, heart rate 72, respirations 20.  Her lower extremity exam reveals right leg to have very thick  hyperpigmented skin in the lower third of the right leg with no  ulcerations or prominent varicosities.  The right leg has 1+ edema  compared to the left.  Venous duplex exam reveals the deep system to be  normal with no evidence of valvular incompetence or deep venous  obstruction.  She does have total occlusion of the right great saphenous  vein just distal to the junction down to the knee level with some  patency of the great saphenous vein from the knee to the ankle which has  reflux at that point.   I explained to her that closing off the distal great saphenous vein from  the ankle to the knee would not affect her edema and that the  hyperpigmentation is something that has developed over many years and is  not reversible.  I have strongly encouraged her to elevate her legs at  night and wear short leg elastic compression stockings on a daily basis  to hopefully improve her symptoms.  She will return to see [place] on a  p.r.n.  basis.   [name] Rochester, M.D.  Electronically Signed   JDL/MEDQ  D:  02/05/2009  [name]:  02/06/2009  Job:  [id number]

## 2010-07-08 NOTE — Assessment & Plan Note (Signed)
OFFICE VISIT   Kathryn [name], Kathryn [name]  DOB:  Jul 28, 1950                                       05/07/2008  [id number]#:[id number]   [name] underwent laser ablation of the right great saphenous vein  September 26, 1998.  She has a history of stasis ulcers with severe  hyperpigmentation, ulceration and pain in the leg prior to her  procedure.  She has no stab phlebectomies performed.  She returned the  following week and a duplex scan at that time revealed total closure of  her right great saphenous vein to the distal thigh, except for some  trickle flow in one area.  She had improvement in her symptoms for a few  months and then began having increasing discomfort in the right thigh  and calf with increasing prominent varicosities in the distal thigh  posteriorly and the proximal calf posteriorly.  She also developed more  swelling in the right ankle and foot, but no stasis ulcer recurred.  She  had no improvement with her  graduated compression stockings which she  had been wearing before and she continued to try this as a relief of her  symptoms as well as ibuprofen and elevation of the legs.  She returns  today with increasing discomfort in the legs despite these conservative  measures.   On exam she does have some significant hyperpigmentation and thickening  of the skin in the mid to lower calf where the stasis ulcers been  present previously on the right side.  She has 1+ edema distally.  She  has bulging varicosities in the distal thigh and proximal calf  posteriorly.  Venous duplex exam today reveals total recanalization of  her right great saphenous system from the knee to the saphenofemoral  junction with gross reflux throughout feeding these bulging  varicosities.  Deep system is normal.   Her right great saphenous system has recanalized since her procedure was  performed in August 9.  She has severe symptomatology and increasing  bulging varicosities  which are painful and I feel we should proceed with  laser ablation once again to the right great saphenous vein with  slightly higher energy level on this procedure.  Will also perform stab  phlebectomies for the painful varicosities.  Will proceed with pre-  certification for this and perform in the near future.   [name] Rochester, M.D.  Electronically Signed   JDL/MEDQ  D:  05/07/2008  [name]:  05/08/2008  Job:  [id number]

## 2010-07-08 NOTE — Procedures (Signed)
DUPLEX DEEP VENOUS EXAM - LOWER EXTREMITY   INDICATION:  Follow-up evaluation, status post EVLT.   HISTORY:  Edema:  Right leg edema, improved since ablation.  Trauma/Surgery:  Patient had laser ablation of the right greater  saphenous vein on 09/26/07.  Pain:  Mild tingling in the right leg.  PE:  No.  Previous DVT:  No.  Anticoagulants:  No.  Other:  Patient had a right ankle venous ulcer that healed prior to  ablation.   DUPLEX EXAM:                CFV   SFV   PopV   PTV   GSV                R  L  R  L  R  L   R  L  R  L  Thrombosis    O  o  o     o      o     +  Spontaneous   +  +  +     +      +     P  +  Phasic        +  +  +     +      +     O  +  Augmentation  +  +  +     +      +     O  +  Compressible  +  +  +     +      +     O  +  Competent     +  O  +     +      +     O  O   Legend:  + - yes  o - no  p - partial  D - decreased   IMPRESSION:  1. Right greater saphenous vein is thrombosed from the saphenofemoral      junction to the calf.  There is a short, patent, incompetent flow      channel in the proximal thigh portion of the right greater      saphenous vein.  The right circumflex is patent.  2. No evidence of right leg deep venous thrombosis.  3. Left common femoral vein and saphenofemoral junction are      incompetent.    _____________________________  [name]. Hart Rochester, M.D.   MC/MEDQ  D:  10/04/2007  T:  10/04/2007  Job:  [id number]

## 2010-07-08 NOTE — Procedures (Signed)
LOWER EXTREMITY VENOUS REFLUX EXAM   INDICATION:  Right lower extremity pain and swelling and history of  right greater saphenous vein ablation.   EXAM:  Using color-flow imaging and pulse Doppler spectral analysis, the  right common femoral, superficial femoral, popliteal, posterior tibial,  greater and lesser saphenous veins are evaluated.  There is no evidence  suggesting deep venous insufficiency in the right lower extremity.   The right saphenofemoral junction is not competent.  The right GSV is  not competent with the caliber as described below.   The right proximal short saphenous vein demonstrates competency with the  saphenous vein diameter.   GSV Diameter (used if found to be incompetent only)                                            Right    Left  Proximal Greater Saphenous Vein           0.83 cm  cm  Proximal-to-mid-thigh                     0.83 cm  cm  Mid thigh                                 0.66 cm  cm  Mid-distal thigh                          0.66 cm  cm  Distal thigh                              0.68 cm  cm  Knee                                      0.56 cm  cm   IMPRESSION:  1. Right greater saphenous vein reflux is identified with the caliber      ranging from 0.56 cm to 1.26 cm knee to groin.  2. The right greater saphenous vein is not aneurysmal.  3. The right greater saphenous vein is not tortuous.  4. The deep venous system is competent.  5. The right lesser saphenous vein is competent.  6. No evidence of deep venous thrombosis noted in the right leg.   ___________________________________________  [name]. Hart Rochester, M.D.   MG/MEDQ  D:  05/07/2008  T:  05/07/2008  Job:  [id number]

## 2010-07-08 NOTE — Assessment & Plan Note (Signed)
[facility]   NAME:  Kathryn [name], Kathryn [name]            ACCOUNT NO.:  [id number]   MEDICAL RECORD NO.:  [id number]      DATE OF BIRTH:  1950/08/03   PHYSICIAN:  Jake Shark A. Tanda Rockers, M.D. VISIT DATE:  08/16/2007                                   OFFICE VISIT   SUBJECTIVE:  Kathryn [name] is a 60 year old lady who we follow for stasis  ulcer involving her right lower extremity.  We have treated her with an  Unna boot in the past.  She returns for followup.  She continues to be  ambulatory.  Her pain is markedly decreased.  There has been no fever.  There has been no shortness of breath or palpitations.   OBJECTIVE:  Blood pressure is 165/81, respirations 18, pulse rate 70,  temperature is 98.  Inspection of the lower extremity shows that the  chronic changes of stasis are persistent along with the ropy  varicosities in the medial aspect of the right lower extremity.  There  are no open ulcers.  The pedal pulses are readily palpable.  There is no  evidence of acute infection.   ASSESSMENT:  Resolved stasis.   PLAN:  We are discharging the patient from the active followup in the  wound center.  We have given her a prescription to procure and wear a 30-  40 mm compression hose.  In addition, she has been seen by the vein and  vascular surgeons of [facility] and she is being considered for vein  ablation.  She will keep those appointments with vein and vascular  surgeon.  We have encouraged her to keep her routine appointment with  her primary care physician.      [name], M.D.  Electronically Signed     HAN/MEDQ  D:  08/16/2007  T:  08/17/2007  Job:  [id number]   cc:   Lorelle Formosa, M.D.  Doug Sou, M.D.  [facility]

## 2010-07-08 NOTE — Consult Note (Signed)
VASCULAR SURGERY CONSULTATION   Kathryn [name], Kathryn [name]  DOB:  [birth year]                                       08/09/2007  NUUVO#:[id number]   The patient is a 60 year old female referred by Dr. Tanda Rockers for surgery  consultation regarding severe venous insufficiency of her right leg.  She has had a 10+ year history of severe symptoms in the right leg  including aching, burning, throbbing and cramping in the thigh and calf  which are particularly aggravated any time she stands.  Her work  requires standing and this is quite difficult for her.  She has not been  wearing elastic compression stockings because recently she developed a  stasis ulcer in her right ankle, which has been treated for the past 6  weeks at the [facility] Center without complete healing.  She has been  wearing Unna boots to try to heal this.  She has no history of  thrombophlebitis or deep venous thrombosis but has had bleeding on one  occasion from her ulceration and the varicosity in this area.  She has  distal edema in the right leg.  She has tried elevating the legs with no  improvement and also has tried pain medication with no improvement.   PAST MEDICAL HISTORY:  Negative for diabetes, hypertension, coronary  artery disease, COPD, stroke.   PREVIOUS SURGERY:  1. Includes laser treatment of her eyes.  2. Right upper extremity surgery for fracture.  3. Surgery for hammertoes right foot.   She does have a history of a heart murmur.   FAMILY HISTORY:  Positive diabetes in her mother.  Coronary artery  disease in a sister.  Negative for stroke.   SOCIAL HISTORY:  She does work, which requires standing.  She does not  use tobacco or alcohol.  She is married.   REVIEW OF SYSTEMS:  Unremarkable.   ALLERGIES:  None known.   PHYSICAL EXAM:  Blood pressure is 175/89, heart rate 62, respirations  14.  General:  She is an obese female in no apparent distress, alert and  oriented x3.  Neck is supple  3+ carotid pulses palpable.  No bruits are  audible.  Neurologic:  Normal.  No palpable adenopathy in the neck.  Chest:  Clear to auscultation.  Cardiovascular:  Regular rhythm with no  murmurs.  Abdomen is soft, nontender with no masses.  3+ femoral,  popliteal, and posterior tibial pulses palpable bilaterally.  Right leg  has severe venous insufficiency with 1 to 2+ distal edema,  hyperpigmentation lower third of the leg with an active ulceration  proximal to the medial malleolus measuring about 1 cm in diameter.  There is no active infection.  She has varicosities of the greater  saphenous system extending back into the distal thigh just proximal to  the popliteal space.  The left leg is free of varicosities or distal  edema.   Venous duplex exam in our office today revealed gross reflux at the  right saphenofemoral junction and throughout the right greater saphenous  vein to the knee level.  Deep venous system is normal with no evidence  of reflux or obstruction.   This patient has gross reflux throughout the right greater saphenous  system, which has resulted in a venous stasis ulcer, hyperpigmentation,  and distal edema, as well as some severe symptoms which affect her  daily  living.  She is a SEAP-6 and would benefit from laser ablation of the  right greater saphenous vein.  We will proceed with precertification.  She is unable to wear elastic compression stockings because of active  treatment with Unna boot for her stasis ulcer.   [name] Rochester, M.D.  Electronically Signed  JDL/MEDQ  D:  08/09/2007  [name]:  08/10/2007  Job:  [id number]

## 2010-07-08 NOTE — Consult Note (Signed)
[name], [name]            ACCOUNT NO.:  [id number]   MEDICAL RECORD NO.:  [id number]          PATIENT TYPE:  REC   LOCATION:  FOOT                         FACILITY:  [facility]   PHYSICIAN:  Theresia Majors. Tanda Rockers, M.D.DATE OF BIRTH:  Feb 13, 1951   DATE OF CONSULTATION:  07/19/2007  DATE OF DISCHARGE:                                 CONSULTATION   SUBJECTIVE:  [name] is a 60 year old female referred by Dr. Doug Sou for evaluation of an ulceration of the right lower extremity.   IMPRESSION:  Stasis ulcer.   RECOMMENDATIONS:  Proceed with Unna boot protocol.   SUBJECTIVE:  [name] is a 60 year old lady who 3 weeks ago had an  episode of swelling and pain in her right lower extremity.  She  subsequently was told that she did not have deep venous thrombosis and  was discharged and instructed to keep her leg elevated and return to her  primary care physician.  In the interim, she developed a blister and an  ulceration.  She was subsequently seen by Dr. Ethelda Chick in the  emergency room and referred to the Wound Center.  There has been no  interim fever.  Her leg has swollen on several occasions it has been  associated with the itching and bruising, but there is no remembers of  direct trauma.  There has been no shortness of breath.  There has been  no palpitation or syncope.   PAST MEDICAL HISTORY:  Remarkable for allergies to CODEINE which causes  nausea.   CURRENT MEDICATIONS:  1. Cipro 500 mg p.o. b.i.d. a 7-day course for suspected infection in      the right lower extremity.  2. She is also on hydrocodone APC 5/500.   PREVIOUS SURGERIES:  Laser surgery to the eye and carpal tunnel on the  right wrist and a hammertoe correction of the right foot.  She has also  had vein injections of the left lower extremity.   FAMILY HISTORY:  Positive for hypertension, coronary disease, stroke,  diabetes, and cancer.   SOCIAL HISTORY:  She is married.  She has several  children who live in  the local area.  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  She spends most of  her time standing.   REVIEW OF SYSTEMS:  She has a normal exercise tolerance, being able to  walk 4 blocks without difficulty prior to this current episode, and  easily able to negotiate a flight of stairs.  She has never smoked.  She  denies hemoptysis.  She denies previous syncope, concussion, seizure  activity, or transient paralysis.  She specifically denies hemoptysis or  chest pain.  Her bowel and bladder function are described as normal.  Her weight has been stable.  There are no polyarthralgias or myalgias.  There is no polydipsia, polyuria, or polyphagia.  The remainder of the  review of systems is negative.   PHYSICAL EXAM:  GENERAL:  She is an alert, oriented female in no acute  distress.  She is accompanied by her husband.  VITAL SIGNS:  The blood pressure is 160/100, respirations 16, pulse  rate  62, and temperature is 98.5.  She is moderately anxious but well  oriented to time, place, and person and appears to be in good contact  with reality.  HEENT:  Clear.  NECK:  Supple.  TRACHEA:  Midline.  THYROID:  Nonpalpable.  LUNGS:  Clear.  HEART:  Herat sounds were normal.  ABDOMEN:  Soft.  EXTREMITY:  Abnormal with advanced changes of stasis in the right lower  extremity including an ulceration on the medial aspect of the right  lower extremity with a soft eschar,  this wound is moderately tender.  The dorsalis pedis pulses are +3 bilaterally.  There is no evidence of  ascending infection or abscess.  NEUROLOGICAL:  The patient remains protective sensation is judged by the  Semmes-Weinstein monofilaments.  There is 2+ edema on the right  associated with these advanced changes of stasis.  There is trace edema  on the left.   The patient was examined in the standing position and there are  prominent varicosities in the lesser saphenous system extending into the  greater  saphenous system on the right with obvious incompetence noted in  the veins.  One particular venule is dilated centimeter and half and  transverse as the posterior medial popliteal area.   DISCUSSION:  [name] is a 60 year old lady with venous hypertension  and secondary stasis ulcers.  There is no evidence of active infection  or arterial insufficiency.  We will proceed with an Unna boot protocol,  and we anticipate this patient will need referral to the vein and  vascular specialist for consideration of ablation therapy for the  culprit veins.  We have explained this approach to the patient in terms  that she seems to understand.  We have given both she and her husband an  opportunity to ask questions,  they seem to understand.  We have  specifically gone over the use of compression and the management of  venous hypertension with stasis ulcerations.  We have emphasized that  none of our treatment should be painful.  We anticipate that she will  have swelling during prolonged excursions in standing when this occurs  she is to elevate her legs for 15 minutes and if she does not get  relief, she is to completely remove the wrap.  She is not modify the  wrap in any way and once she removes she is not put it on and rather  call the clinic and be seen priority for reapplication.  We have also  given her a statement written on a prescription pad indicating that she  should elevate her legs 15 minutes out of every 2 hours.  She will  present that to her employer.  We will reevaluate her in 1 week to  assess her response to therapy.      [name], M.D.  Electronically Signed     HAN/MEDQ  D:  07/19/2007  T:  07/20/2007  Job:  [id number]   cc:   Doug Sou, M.D.  Lorelle Formosa, M.D.

## 2010-07-08 NOTE — Assessment & Plan Note (Signed)
[facility]   NAME:  Kathryn [name], Kathryn [name]            ACCOUNT NO.:  [id number]   MEDICAL RECORD NO.:  [id number]      DATE OF BIRTH:  1950-10-06   PHYSICIAN:  Jake Shark A. Tanda Rockers, M.D. VISIT DATE:  08/02/2007                                   OFFICE VISIT   SUBJECTIVE:  Kathryn [name] is a 60 year old who we have followed for  stasis ulcer.  She has an outstanding consultation with vein and  vascular surgeons [facility] for consideration of ablated therapy.  In  the interim, we treated her with a Profore compression wrap.  She is  complaining of pain in the area of the wrap.  There has been no fever  and there has been no excessive drainage.   OBJECTIVE:  Blood pressure is 137/64, respirations 16, pulse rate 80,  and temperature 98.3.  Inspection of the right lower extremity shows  that the chronic changes of stasis are persistent including 2+ edema.  The ulcer itself is completely resolved.  There is marked tenderness  over area of the previous ulcer.  There is no local warmth or drainage  however.  The pedal pulse remains readily palpable.  There is no  evidence of ascending infection or ischemia.The prominent  veins/varicosities on the medial aspect of the right leg are not  thrombosed, but are prominent.   IMPRESSION:  Residual edema, inadequate edema control.   PLAN:  We are placing the patient in an Radio broadcast assistant.  We have instructed  her on the use of the compression wrap as well as the necessity for  elevating the leg.  We provided her hydrocodone 5/500, a total of 20  tablets to take 1-2 every 6-8 hours p.r.n. for pain.  We will reevaluate  her in 1 week.  In the interim, she will continue to pursue the  consultation with the vein and vascular surgeons.      [name], M.D.  Electronically Signed     HAN/MEDQ  D:  08/02/2007  T:  08/03/2007  Job:  [id number]

## 2010-07-08 NOTE — Procedures (Signed)
LOWER EXTREMITY VENOUS REFLUX EXAM   INDICATION:  Right leg ulcer with varicose vein, pain and swelling.   EXAM:  Using color-flow imaging and pulse Doppler spectral analysis, the  right common femoral, superficial femoral, popliteal, posterior tibial,  greater and lesser saphenous veins are evaluated.  There is no evidence  suggesting deep venous insufficiency in the right lower extremity.   The right saphenofemoral junction is not competent.  The right GSV is  not competent with the caliber as described below.   The right proximal short saphenous vein demonstrates competency.   GSV Diameter (used if found to be incompetent only)                                            Right    Left  Proximal Greater Saphenous Vein           0.78 cm  cm  Proximal-to-mid-thigh                     0.78 cm  cm  Mid thigh                                 0.84 cm  cm  Mid-distal thigh                          0.84 cm  cm  Distal thigh                              0.73 cm  cm  Knee                                      0.56 cm  cm    IMPRESSION:  1. Right greater saphenous vein reflux is identified with the caliber      ranging from 0.56 cm to 1.18 cm knee to groin.  2. The right greater saphenous vein is not aneurysmal.  3. The right greater saphenous vein is not tortuous.  4. The deep venous system is competent.  5. The right lesser saphenous vein is competent.  6. No evidence of deep venous thrombosis noted in the right leg.       ___________________________________________  [name]. Hart [place], M.D.   MG/MEDQ  D:  08/09/2007  T:  08/09/2007  Job:  [id number]

## 2010-07-08 NOTE — Assessment & Plan Note (Signed)
OFFICE VISIT   Kathryn, [name]  DOB:  1950-08-28                                       08/06/2008  UXLKG#:[id number]   The patient is 1 week status post repeat laser ablation of the right  great saphenous vein with multiple stab phlebectomies for painful  varicosities.  She has a remote history of a stasis ulcer in the right  ankle and her greater saphenous vein had recanalized following  successful closure in August 2009.  She has not developed a recurrent  stasis ulcer.  Today, she returns with complete closure of the great  saphenous vein on venous duplex exam, from the saphenofemoral junction  to the knee, with no evidence of DVT.  She has very minimal tenderness  along the course of the great saphenous vein in the right thigh and her  stab phlebectomy wounds are healing appropriately.  There is no change  in her distal edema which is 1+ to 2+ on a chronic basis with thickening  and hyperpigmentation of the skin.  We will fit her for some short-leg  elastic compression stockings to wear on a chronic basis and she will  return in 6 months for venous duplex reflux study of the right leg since  she did experience recanalization on this occasion.   [name] [place], M.D.  Electronically Signed   JDL/MEDQ  D:  08/06/2008  T:  08/07/2008  Job:  [id number]

## 2010-09-01 HISTORY — PX: COLONOSCOPY: SHX174

## 2010-09-01 LAB — HM COLONOSCOPY

## 2010-09-04 LAB — HM COLONOSCOPY

## 2010-11-19 LAB — D-DIMER, QUANTITATIVE

## 2010-11-19 LAB — CBC
HCT: 36.3
Hemoglobin: 11.8 — ABNORMAL LOW
MCHC: 32.7
MCV: 79.2
Platelets: 261
RBC: 4.58
RDW: 14.9
WBC: 8

## 2010-11-19 LAB — DIFFERENTIAL
Basophils Absolute: 0
Basophils Relative: 0
Eosinophils Absolute: 0
Eosinophils Relative: 1
Lymphocytes Relative: 35
Lymphs Abs: 2.8
Monocytes Absolute: 0.7
Monocytes Relative: 9
Neutro Abs: 4.4
Neutrophils Relative %: 55

## 2010-11-27 ENCOUNTER — Encounter: Payer: Self-pay | Admitting: Medical

## 2010-11-27 ENCOUNTER — Ambulatory Visit (INDEPENDENT_AMBULATORY_CARE_PROVIDER_SITE_OTHER): Payer: Managed Care, Other (non HMO) | Admitting: Medical

## 2010-11-27 DIAGNOSIS — Z2911 Encounter for prophylactic immunotherapy for respiratory syncytial virus (RSV): Secondary | ICD-10-CM

## 2010-11-27 DIAGNOSIS — D649 Anemia, unspecified: Secondary | ICD-10-CM

## 2010-11-27 DIAGNOSIS — R300 Dysuria: Secondary | ICD-10-CM

## 2010-11-27 DIAGNOSIS — E119 Type 2 diabetes mellitus without complications: Secondary | ICD-10-CM

## 2010-11-27 DIAGNOSIS — I10 Essential (primary) hypertension: Secondary | ICD-10-CM

## 2010-11-27 DIAGNOSIS — E039 Hypothyroidism, unspecified: Secondary | ICD-10-CM

## 2010-11-27 DIAGNOSIS — Z0000 Encounter for general adult medical examination without abnormal findings: Secondary | ICD-10-CM

## 2010-11-27 DIAGNOSIS — E669 Obesity, unspecified: Secondary | ICD-10-CM

## 2010-11-27 DIAGNOSIS — Z23 Encounter for immunization: Secondary | ICD-10-CM

## 2010-11-27 DIAGNOSIS — E785 Hyperlipidemia, unspecified: Secondary | ICD-10-CM

## 2010-11-27 DIAGNOSIS — R011 Cardiac murmur, unspecified: Secondary | ICD-10-CM

## 2010-11-27 LAB — POCT URINALYSIS DIPSTICK
Bilirubin, UA: NEGATIVE
Blood, UA: NEGATIVE
Glucose, UA: NEGATIVE
Ketones, UA: NEGATIVE
Leukocytes, UA: NEGATIVE
Nitrite, UA: NEGATIVE
Protein, UA: NEGATIVE
Spec Grav, UA: 1.025
Urobilinogen, UA: NEGATIVE
pH, UA: 5

## 2010-11-27 LAB — COMPREHENSIVE METABOLIC PANEL
ALT: 32 U/L (ref 0–35)
AST: 30 U/L (ref 0–37)
Albumin: 4.3 g/dL (ref 3.5–5.2)
Alkaline Phosphatase: 82 U/L (ref 39–117)
BUN: 16 mg/dL (ref 6–23)
CO2: 25 mEq/L (ref 19–32)
Calcium: 9.6 mg/dL (ref 8.4–10.5)
Chloride: 109 mEq/L (ref 96–112)
Creat: 0.81 mg/dL (ref 0.50–1.10)
Glucose, Bld: 94 mg/dL (ref 70–99)
Potassium: 4 mEq/L (ref 3.5–5.3)
Sodium: 142 mEq/L (ref 135–145)
Total Bilirubin: 0.5 mg/dL (ref 0.3–1.2)
Total Protein: 7.3 g/dL (ref 6.0–8.3)

## 2010-11-27 LAB — HEMOGLOBIN A1C
Hgb A1c MFr Bld: 6.3 % — ABNORMAL HIGH (ref <5.7)
Mean Plasma Glucose: 134 mg/dL — ABNORMAL HIGH (ref <117)

## 2010-11-27 LAB — LIPID PANEL
Cholesterol: 153 mg/dL (ref 0–200)
HDL: 40 mg/dL (ref >39)
LDL Cholesterol: 86 mg/dL (ref 0–99)
Total CHOL/HDL Ratio: 3.8 Ratio
Triglycerides: 133 mg/dL (ref <150)
VLDL: 27 mg/dL (ref 0–40)

## 2010-11-27 LAB — T4, FREE: Free T4: 0.93 ng/dL (ref 0.80–1.80)

## 2010-11-27 LAB — TSH: TSH: 1.775 u[IU]/mL (ref 0.350–4.500)

## 2010-11-27 MED ORDER — LEVOTHYROXINE SODIUM 100 MCG PO TABS: 100.0000 ug | ORAL_TABLET | Freq: Every day | ORAL | Status: DC

## 2010-11-27 MED ORDER — LISINOPRIL-HYDROCHLOROTHIAZIDE 10-12.5 MG PO TABS: 1.0000 | ORAL_TABLET | Freq: Every day | ORAL | Status: DC

## 2010-11-27 MED ORDER — ATORVASTATIN CALCIUM 10 MG PO TABS: 10.0000 mg | ORAL_TABLET | Freq: Every day | ORAL | Status: DC

## 2010-11-27 NOTE — Progress Notes (Signed)
Subjective:   HPI  Kathryn [name] is a 60 y.o. female who presents for a complete physical.  She is here alone today.   She provides the history, but seems to have difficulty with medical terms, what diagnosis she really has.  Her last visit here was 11/2009.  This is my first visit with Kathryn [name].  She notes that she has been doing well without c/o.  She is married, has 5 children, and works at a [employer].  She notes last mammogram 2011, last pap smear unknown.   She requests shingles and pneumococcal vaccines today.  She can't recall her last tetanus booster.  She is here for f/u on chronic issues and needs medication refills.  She is not aware of her diagnosis of diabetes, but she is on medication for cholesterol and blood pressure.  She does not have her mediations with her.  We called her pharmacy to verify medications and doses.  She has not been taking her cholesterol medication.    She has a hx/o hypothyroidism, here labs and med refill today.  She has a heart murmur, saw cardiology last year, but has not been back for f/u.  Reviewed their medical, surgical, family, social, medication, and allergy history and updated chart as appropriate.  Past Medical History  Diagnosis Date  . Hyperlipidemia   . Hypothyroidism   . Diabetes mellitus   . Obesity   . Tubulovillous adenoma   . Wears glasses   . Hypertension   . Anemia   . Diverticula of colon     per colonoscopy6/20/11  . 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 Aortic stenosis     heart murmur; Dr. Charlies Constable, [facility]    Past Surgical History  Procedure Date  . Colectomy 08/13/09    laparoscopic converted to open transverse colectomy w/ side to side anastomosis  . Colonoscopy 09/01/10    Dr. Charna Elizabeth; polyp, tubular adenoma; repeat 3 years  . Vein surgery 2010    right leg  . Lasik   . Carpal tunnel release     bilat    Family History  Problem Relation  Age of Onset  . Cancer Father     stomach cancer  . Diabetes Sister     History   Social History  . Marital Status: Divorced    Spouse Name: N/A    Number of Children: N/A  . Years of Education: N/A   Occupational History  . Not on file.   Social History Main Topics  . Smoking status: Never Smoker   . Smokeless tobacco: Never Used  . Alcohol Use: No  . Drug Use: No  . Sexually Active: Not on file     married, 5 children, works at [employer], exrecise with walking   Other Topics Concern  . Not on file   Social History Narrative  . No narrative on file    No current outpatient prescriptions on file prior to visit.    Allergies  Allergen Reactions  . Codeine      Review of Systems Constitutional: denies fever, chills, sweats, unexpected weight change, anorexia, fatigue Allergy: negative; denies recent sneezing, itching, congestion Dermatology: denies changing moles, rash, lumps, new worrisome lesions ENT: no runny nose, ear pain, sore throat, hoarseness, sinus pain, teeth pain, tinnitus, hearing loss, epistaxis Cardiology: denies chest pain, palpitations, edema, orthopnea, paroxysmal nocturnal dyspnea Respiratory: denies cough, shortness of breath, dyspnea on exertion, wheezing, hemoptysis  Gastroenterology: denies abdominal pain, nausea, vomiting, diarrhea, constipation, blood in stool, changes in bowel movement, dysphagia Hematology: denies bleeding or bruising problems Musculoskeletal: denies arthralgias, myalgias, joint swelling, back pain, neck pain, cramping, gait changes Ophthalmology: denies vision changes, eye redness, itching, discharge Urology: denies dysuria, difficulty urinating, hematuria, urinary frequency, urgency, incontinence Neurology: no headache, weakness, tingling, numbness, speech abnormality, memory loss, falls, dizziness Psychology: denies depressed mood, agitation, sleep problems     Objective:   Physical Exam  Filed Vitals:    11/27/10 1518  BP: 130/80  Pulse: 60  Temp: 98.2 F (36.8 C)  Resp: 16    General appearance: alert, no distress, WD/WN, overweight black female Skin: right face with grouping of 5 raised flat topped papules, all brown and uniform, unchanged since childhood per pt, otherwise skin unremarkable, no worrisome lesions HEENT: normocephalic, conjunctiva/corneas normal, sclerae anicteric, PERRLA, EOMi, nares patent, no discharge or erythema, pharynx normal Oral cavity: MMM, tongue normal, upper dentures, lower teeth in good repair Neck: supple, no lymphadenopathy, no thyromegaly, no masses, normal ROM, no bruit Chest: non tender, normal shape and expansion Heart: RRR, III/VI holosystolic murmur heard upper left and right sternal border the best, otherwise normal S2 Lungs: CTA bilaterally, no wheezes, rhonchi, or rales Abdomen: +bs, soft, vertical surgical scar, non tender, non distended, no masses, no hepatomegaly, no splenomegaly, no bruits Back: non tender, normal ROM, no scoliosis Musculoskeletal: upper extremities non tender, no obvious deformity, normal ROM throughout, lower extremities non tender, no obvious deformity, normal ROM throughout Extremities: mild 1+ nonpitting LE edema, no cyanosis, no clubbing Pulses: 2+ symmetric, upper and lower extremities, normal cap refill Neurological: alert, oriented x 3, CN2-12 intact, strength normal upper extremities and lower extremities, sensation normal throughout, DTRs 2+ throughout, no cerebellar signs, gait normal Psychiatric: normal affect, behavior normal, pleasant  Breast/pelvic/rectal deferred to gynecology   Assessment :    Encounter Diagnoses  Name Primary?  . General medical examination Yes  . Type II or unspecified type diabetes mellitus without mention of complication, not stated as uncontrolled   . Hyperlipidemia   . Anemia   . Obesity   . Hypothyroidism   . Essential hypertension, benign   . Dysuria   . Murmur   . Need for  tetanus booster   . Need for shingles vaccine       Plan:    Physical exam -  She seems to have a basic understanding of her health problems, but her comprehension and cognition of topics seems somewhat limited.   We discussed healthy lifestyle, diet, exercise, preventative care, vaccinations, and addressed her concerns.    Diabetes - she is under the impression that she does not have diabetes.  Prior HgbA1C was over 6.5%.  She is not on any diabetic medication.  We will check labs today.  Hyperlipidemia - she has not been taking her statin.  She ran out for unknown reason.  Labs today, and will send refill to the pharmacy.  I reiterated that this is to help lower her cholesterol and lower risk of heart disease.  Anemia - recheck labs today.  She had been taking iron at some point.   Obesity - advised the need to lose weight.   Hypothyroidism - compliant.  Labs today. Refills today.  HTN - compliant with medication.  refills today.   Dysuria - will send urine for culture.  Murmur - she as due back for cardiology recheck 7/12.  We called and set her up yearly f/u appointment  on aortic stenosis.    Tdap and shingles vaccines given today along with VIS.  She apparently had pneumococcal vaccine here in 2011 although there is limited documentation.  She reports having her flu shot last week.  We will refer to gynecology for routine gynecological exam and screening.  We will refer to ophthalmology once she calls [place] back with schedule availability.

## 2010-11-28 LAB — CBC WITH DIFFERENTIAL/PLATELET
Basophils Absolute: 0 10*3/uL (ref 0.0–0.1)
Basophils Relative: 0 % (ref 0–1)
Eosinophils Absolute: 0.1 10*3/uL (ref 0.0–0.7)
Eosinophils Relative: 2 % (ref 0–5)
HCT: 37.6 % (ref 36.0–46.0)
Hemoglobin: 11.9 g/dL — ABNORMAL LOW (ref 12.0–15.0)
Lymphocytes Relative: 46 % (ref 12–46)
Lymphs Abs: 2.1 10*3/uL (ref 0.7–4.0)
MCH: 26.1 pg (ref 26.0–34.0)
MCHC: 31.6 g/dL (ref 30.0–36.0)
MCV: 82.5 fL (ref 78.0–100.0)
Monocytes Absolute: 0.3 10*3/uL (ref 0.1–1.0)
Monocytes Relative: 7 % (ref 3–12)
Neutro Abs: 2 10*3/uL (ref 1.7–7.7)
Neutrophils Relative %: 45 % (ref 43–77)
Platelets: 208 10*3/uL (ref 150–400)
RBC: 4.56 MIL/uL (ref 3.87–5.11)
RDW: 15.8 % — ABNORMAL HIGH (ref 11.5–15.5)
WBC: 4.5 10*3/uL (ref 4.0–10.5)

## 2010-12-24 ENCOUNTER — Encounter: Payer: Self-pay | Admitting: *Deleted

## 2010-12-25 ENCOUNTER — Ambulatory Visit: Payer: Managed Care, Other (non HMO) | Admitting: Physician Assistant

## 2012-02-24 DIAGNOSIS — Z9289 Personal history of other medical treatment: Secondary | ICD-10-CM

## 2012-02-24 HISTORY — DX: Personal history of other medical treatment: Z92.89

## 2012-10-17 ENCOUNTER — Ambulatory Visit (INDEPENDENT_AMBULATORY_CARE_PROVIDER_SITE_OTHER): Payer: Managed Care, Other (non HMO) | Admitting: Medical

## 2012-10-17 ENCOUNTER — Encounter: Payer: Self-pay | Admitting: Medical

## 2012-10-17 VITALS — BP 138/80 | HR 68 | Temp 97.9°F | Resp 16 | Wt 251.0 lb

## 2012-10-17 DIAGNOSIS — R011 Cardiac murmur, unspecified: Secondary | ICD-10-CM

## 2012-10-17 DIAGNOSIS — E039 Hypothyroidism, unspecified: Secondary | ICD-10-CM

## 2012-10-17 DIAGNOSIS — I10 Essential (primary) hypertension: Secondary | ICD-10-CM

## 2012-10-17 DIAGNOSIS — E785 Hyperlipidemia, unspecified: Secondary | ICD-10-CM

## 2012-10-17 DIAGNOSIS — E669 Obesity, unspecified: Secondary | ICD-10-CM

## 2012-10-17 DIAGNOSIS — L219 Seborrheic dermatitis, unspecified: Secondary | ICD-10-CM

## 2012-10-17 DIAGNOSIS — D649 Anemia, unspecified: Secondary | ICD-10-CM

## 2012-10-17 DIAGNOSIS — R7301 Impaired fasting glucose: Secondary | ICD-10-CM

## 2012-10-17 LAB — CBC WITH DIFFERENTIAL/PLATELET
Basophils Absolute: 0 10*3/uL (ref 0.0–0.1)
Basophils Relative: 0 % (ref 0–1)
Eosinophils Absolute: 0.1 10*3/uL (ref 0.0–0.7)
Eosinophils Relative: 1 % (ref 0–5)
HCT: 36 % (ref 36.0–46.0)
Hemoglobin: 11.6 g/dL — ABNORMAL LOW (ref 12.0–15.0)
Lymphocytes Relative: 45 % (ref 12–46)
Lymphs Abs: 2.3 10*3/uL (ref 0.7–4.0)
MCH: 24.9 pg — ABNORMAL LOW (ref 26.0–34.0)
MCHC: 32.2 g/dL (ref 30.0–36.0)
MCV: 77.4 fL — ABNORMAL LOW (ref 78.0–100.0)
Monocytes Absolute: 0.4 10*3/uL (ref 0.1–1.0)
Monocytes Relative: 8 % (ref 3–12)
Neutro Abs: 2.3 10*3/uL (ref 1.7–7.7)
Neutrophils Relative %: 46 % (ref 43–77)
Platelets: 255 10*3/uL (ref 150–400)
RBC: 4.65 MIL/uL (ref 3.87–5.11)
RDW: 16.5 % — ABNORMAL HIGH (ref 11.5–15.5)
WBC: 5 10*3/uL (ref 4.0–10.5)

## 2012-10-17 LAB — COMPREHENSIVE METABOLIC PANEL
ALT: 24 U/L (ref 0–35)
AST: 22 U/L (ref 0–37)
Albumin: 4.3 g/dL (ref 3.5–5.2)
Alkaline Phosphatase: 99 U/L (ref 39–117)
BUN: 10 mg/dL (ref 6–23)
CO2: 27 mEq/L (ref 19–32)
Calcium: 9.9 mg/dL (ref 8.4–10.5)
Chloride: 108 mEq/L (ref 96–112)
Creat: 0.84 mg/dL (ref 0.50–1.10)
Glucose, Bld: 88 mg/dL (ref 70–99)
Potassium: 4.6 mEq/L (ref 3.5–5.3)
Sodium: 142 mEq/L (ref 135–145)
Total Bilirubin: 0.5 mg/dL (ref 0.3–1.2)
Total Protein: 7.4 g/dL (ref 6.0–8.3)

## 2012-10-17 LAB — HEMOGLOBIN A1C
Hgb A1c MFr Bld: 6.7 % — ABNORMAL HIGH (ref <5.7)
Mean Plasma Glucose: 146 mg/dL — ABNORMAL HIGH (ref <117)

## 2012-10-17 LAB — TSH: TSH: 5.244 u[IU]/mL — ABNORMAL HIGH (ref 0.350–4.500)

## 2012-10-17 LAB — T4, FREE: Free T4: 0.94 ng/dL (ref 0.80–1.80)

## 2012-10-17 MED ORDER — LEVOTHYROXINE SODIUM 100 MCG PO TABS: 100.0000 ug | ORAL_TABLET | Freq: Every day | ORAL | Status: DC

## 2012-10-17 MED ORDER — HYDROCORTISONE 2.5 % EX CREA: TOPICAL_CREAM | Freq: Two times a day (BID) | CUTANEOUS | Status: DC

## 2012-10-17 MED ORDER — LISINOPRIL-HYDROCHLOROTHIAZIDE 10-12.5 MG PO TABS: 1.0000 | ORAL_TABLET | Freq: Every day | ORAL | Status: DC

## 2012-10-17 NOTE — Progress Notes (Signed)
  Subjective:   HPI  Kathryn [name] is a 62 y.o. female who presents for chronic disease f/u.  Her last visit here was 11/2010.   She can't give me a good reason why it took her so long to follow up here.  She has been out of her medications for thyroid and BP and cholesterol.   Although chart records show diabetes diagnosis, she claims borderline diabetes.  She has a heart murmur, saw cardiology 2012, but has not been back for f/u.  She is not checking glucose, is not using any particular diet discretion.   She is exercising some with walking.  Her only new c/o today is intermittent rash of right scalp border and left behind ear that comes and goes, is itchy.   Uses lotion, but no other creams.   Reviewed their medical, surgical, family, social, medication, and allergy history and updated chart as appropriate.  Past Medical History  Diagnosis Date  . Hyperlipidemia   . Hypothyroidism   . Diabetes mellitus   . Obesity   . Tubulovillous adenoma   . Wears glasses   . Hypertension   . Anemia   . Diverticula of colon     per colonoscopy6/20/11  . 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 Aortic stenosis     heart murmur; Dr. Charlies Constable, [facility]  . Edema of lower extremity     wears ted hose  . Wears dentures     upper    ROS as in subjective     Objective:   Physical Exam  Filed Vitals:   10/17/12 1549  BP: 138/80  Pulse: 68  Temp: 97.9 F (36.6 C)  Resp: 16    General appearance: alert, no distress, WD/WN, overweight black female Skin: right face with grouping of 5 raised flat topped papules, all brown and uniform, unchanged since childhood per pt, right parietal scalp at hair line border as well as left post auricular region with slightly raised brown, rough patches of skin, otherwise skin unremarkable, no worrisome lesions Neck: supple, no lymphadenopathy, no thyromegaly, no masses, normal ROM, no bruit Heart: RRR,  III/VI holosystolic murmur heard upper left and right sternal border the best, otherwise normal S2 Lungs: CTA bilaterally, no wheezes, rhonchi, or rales Abdomen: +bs, soft, vertical surgical scar, non tender, non distended, no masses, no hepatomegaly, no splenomegaly, no bruits Extremities: no edema, no cyanosis, no clubbing Pulses: 2+ symmetric, upper and lower extremities, normal cap refill   Assessment :    Encounter Diagnoses  Name Primary?  . Essential hypertension, benign Yes  . Unspecified hypothyroidism   . Obesity, unspecified   . Seborrheic dermatitis   . Impaired fasting blood sugar   . Anemia   . Hyperlipidemia   . Heart murmur       Plan:    Labs today, non fasting.   Will not do lipid panel since she is nonfasting.   Discussed need for more frequent follow up given her chronic issues and medications.  Discussed medication compliance, getting routine exercise, eating healthy.   Advised she schedule f/u with cardiology on murmur, chart hx/o showing aortic stenosis although I don't have her cardiology records.  Script for hydrocortisone cream for the rash, short term use.  Advised daily moisturizing lotion.

## 2012-10-18 ENCOUNTER — Other Ambulatory Visit: Payer: Self-pay | Admitting: Medical

## 2012-10-18 MED ORDER — METFORMIN HCL 500 MG PO TABS: 500.0000 mg | ORAL_TABLET | Freq: Two times a day (BID) | ORAL | Status: DC

## 2012-10-18 MED ORDER — METFORMIN HCL 500 MG PO TABS: 500.0000 mg | ORAL_TABLET | Freq: Every day | ORAL | Status: DC

## 2012-11-15 ENCOUNTER — Encounter: Payer: Self-pay | Admitting: Physician Assistant

## 2012-11-15 ENCOUNTER — Ambulatory Visit (INDEPENDENT_AMBULATORY_CARE_PROVIDER_SITE_OTHER): Payer: Managed Care, Other (non HMO) | Admitting: Physician Assistant

## 2012-11-15 VITALS — BP 140/90 | HR 70 | Ht 67.0 in | Wt 246.0 lb

## 2012-11-15 DIAGNOSIS — I359 Nonrheumatic aortic valve disorder, unspecified: Secondary | ICD-10-CM

## 2012-11-15 DIAGNOSIS — I447 Left bundle-branch block, unspecified: Secondary | ICD-10-CM

## 2012-11-15 DIAGNOSIS — I350 Nonrheumatic aortic (valve) stenosis: Secondary | ICD-10-CM

## 2012-11-15 DIAGNOSIS — E785 Hyperlipidemia, unspecified: Secondary | ICD-10-CM

## 2012-11-15 DIAGNOSIS — I10 Essential (primary) hypertension: Secondary | ICD-10-CM

## 2012-11-15 NOTE — Patient Instructions (Addendum)
Your physician has requested that you have an echocardiogram. Echocardiography is a painless test that uses sound waves to create images of your heart. It provides your doctor with information about the size and shape of your heart and how well your heart's chambers and valves are working. This procedure takes approximately one hour. There are no restrictions for this procedure.  Your physician recommends that you schedule a follow-up appointment in: 1 year with Dr.Crenshaw

## 2012-11-15 NOTE — Progress Notes (Signed)
[address] [place], [place]  [address] Phone: [phone] Fax:  [phone]  Date:  11/15/2012   ID:  Kathryn [name], Kathryn [name], MRN [id number]  PCP:  Ernst Breach, PA-C  Cardiologist:  Dr. Olga Millers     History of Present Illness: Kathryn [name] is a 62 y.o. female who returns for followup.  She has a hx of HTN, HL, hypothyroidism, LBBB, borderline DM2.  She was evaluated by Dr. Olga Millers in 04/2008 for LBBB, murmur and CP.  Adenosine Myoview 04/2008:  Normal.  Echo 04/2008:  EF 60%, mean AV gradient 7 mmHg.  Last seen in our office in 07/2009 for surgical clearance.    She denies chest discomfort or significant dyspnea. She is probably NYHA class II. She denies orthopnea, PND. She does note LE edema without significant change. She denies syncope.  Labs (8/14):  K 4.6, Cr 0.84, ALT 24, Hgb 11.6, TSH 5.244, FT4 0.94  Wt Readings from Last 3 Encounters:  11/15/12 246 lb (111.585 kg)  10/17/12 251 lb (113.853 kg)  11/27/10 250 lb (113.399 kg)     Past Medical History  Diagnosis Date  . Hyperlipidemia   . Hypothyroidism   . Diabetes mellitus   . Obesity   . Tubulovillous adenoma   . Wears glasses   . Hypertension   . Anemia   . Diverticula of colon     per colonoscopy6/20/11  . 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 Aortic stenosis     Echo 04/2008:  EF 60%, mean AV gradient 7 mmHg.   . Edema of lower extremity     wears ted hose  . Wears dentures     upper  . Hx of cardiovascular stress test     Adenosine Myoview 04/2008:  Normal.  . LBBB (left bundle branch block)     Current Outpatient Prescriptions  Medication Sig Dispense Refill  . hydrocortisone 2.5 % cream Apply topically 2 (two) times daily.  30 g  0  . levothyroxine (SYNTHROID, LEVOTHROID) 100 MCG tablet Take 1 tablet (100 mcg total) by mouth daily.  30 tablet  3  . lisinopril-hydrochlorothiazide (PRINZIDE,ZESTORETIC) 10-12.5  MG per tablet Take 1 tablet by mouth daily.  30 tablet  3  . metFORMIN (GLUCOPHAGE) 500 MG tablet Take 1 tablet (500 mg total) by mouth daily with breakfast. 1 tablet po daily for diabetes  30 tablet  3   No current facility-administered medications for this visit.    Allergies:    Allergies  Allergen Reactions  . Codeine     Social History:  The patient  reports that she has never smoked. She has never used smokeless tobacco. She reports that she does not drink alcohol or use illicit drugs.   ROS:  Please see the history of present illness.      All other systems reviewed and negative.   PHYSICAL EXAM: VS:  BP 140/90  Pulse 70  Ht 5\' 7"  (1.702 m)  Wt 246 lb (111.585 kg)  BMI 38.52 kg/m2 Well nourished, well developed, in no acute distress HEENT: normal Neck: no JVD Cardiac:  normal S1, S2; RRR; 2/6 systolic murmur heard best at the RUSB Lungs:  clear to auscultation bilaterally, no wheezing, rhonchi or rales Abd: soft, nontender, no hepatomegaly Ext: no edema Skin: warm and dry Neuro:  CNs 2-12 intact, no focal abnormalities noted  EKG:  NSR, HR 70, LBBB  ASSESSMENT AND PLAN:  1. Mild Aortic Stenosis:  She does not have any symptoms that would suggest worsening aortic stenosis. It has been 4 years since her last echocardiogram. I will arrange a followup 2-D echocardiogram. 2. Hypertension:  Borderline control. Continue follow up with PCP. 3. Hyperlipidemia:  Given risk equivalent of diabetes, she would benefit from the addition of a moderate to high intensity statin for cardiovascular benefit based upon current guidelines.  I reviewed this with the patient today and recommended she discuss this further with her PCP. 4. Disposition:  Follow up with Dr. Jens Som in 1 year.  Signed, Tereso Newcomer, PA-C  11/15/2012 3:43 PM

## 2012-11-18 ENCOUNTER — Telehealth: Payer: Self-pay | Admitting: Medical

## 2012-11-18 MED ORDER — HYDROCORTISONE 2.5 % EX CREA: TOPICAL_CREAM | Freq: Two times a day (BID) | CUTANEOUS | Status: DC

## 2012-11-18 NOTE — Telephone Encounter (Signed)
Rx refill was sent to the pharmacy. CLS 

## 2012-11-18 NOTE — Telephone Encounter (Signed)
Pt is requesting refill for Hydrocortisone 2.5% Cream to [facility] [address], [place]

## 2012-11-25 ENCOUNTER — Ambulatory Visit: Payer: Managed Care, Other (non HMO) | Admitting: Medical

## 2012-11-28 ENCOUNTER — Encounter: Payer: Self-pay | Admitting: Medical

## 2012-11-28 ENCOUNTER — Ambulatory Visit (INDEPENDENT_AMBULATORY_CARE_PROVIDER_SITE_OTHER): Payer: Managed Care, Other (non HMO) | Admitting: Medical

## 2012-11-28 VITALS — BP 118/80 | HR 80 | Temp 98.2°F | Resp 16 | Wt 238.0 lb

## 2012-11-28 DIAGNOSIS — E785 Hyperlipidemia, unspecified: Secondary | ICD-10-CM

## 2012-11-28 DIAGNOSIS — Z23 Encounter for immunization: Secondary | ICD-10-CM

## 2012-11-28 DIAGNOSIS — E119 Type 2 diabetes mellitus without complications: Secondary | ICD-10-CM

## 2012-11-28 DIAGNOSIS — E669 Obesity, unspecified: Secondary | ICD-10-CM

## 2012-11-28 MED ORDER — ATORVASTATIN CALCIUM 20 MG PO TABS: 20.0000 mg | ORAL_TABLET | Freq: Every day | ORAL | Status: DC

## 2012-11-28 NOTE — Patient Instructions (Signed)
Diabetes Meal Planning Guide The diabetes meal planning guide is a tool to help you plan your meals and snacks. It is important for people with diabetes to manage their blood glucose (sugar) levels. Choosing the right foods and the right amounts throughout your day will help control your blood glucose. Eating right can even help you improve your blood pressure and reach or maintain a healthy weight.  CARBOHYDRATE COUNTING MADE EASY When you eat carbohydrates, they turn to sugar. This raises your blood glucose level. Counting carbohydrates can help you control this level so you feel better. When you plan your meals by counting carbohydrates, you can have more flexibility in what you eat and balance your medicine with your food intake.  Carbohydrate counting simply means adding up the total amount of carbohydrate grams in your meals and snacks. Try to eat about the same amount at each meal. Foods with carbohydrates are listed below.   Each portion below is 1 carbohydrate serving or 15 grams of carbohydrates.    1800 Calorie Diet for Diabetes Meal Planning The 1800 calorie diet is designed for eating up to 1800 calories each day. Following this diet and making healthy meal choices can help improve overall health. This diet controls blood sugar (glucose) levels and can also help lower blood pressure and cholesterol.  SERVING SIZES Measuring foods and serving sizes helps to make sure you are getting the right amount of food. The list below tells how big or small some common serving sizes are:  1 oz.........4 stacked dice.  3 oz.........Deck of cards.  1 tsp........Tip of little finger.  1 tbs........Thumb.  2 tbs........Golf ball.   cup.......Half of a fist.  1 cup........A fist.   GUIDELINES FOR CHOOSING FOODS The goal of this diet is to eat a variety of foods and limit calories to 1800 each day. This can be done by choosing foods that are low in calories and fat. The diet also suggests  eating small amounts of food frequently. Doing this helps control your blood glucose levels so they do not get too high or too low. Each meal or snack may include a protein food source to help you feel more satisfied and to stabilize your blood glucose. Try to eat about the same amount of food around the same time each day. This includes weekend days, travel days, and days off work. Space your meals about 4 to 5 hours apart and add a snack between them if you wish.  For example, a daily food plan could include breakfast, a morning snack, lunch, dinner, and an evening snack. Healthy meals and snacks include whole grains, vegetables, fruits, lean meats, poultry, fish, and dairy products. As you plan your meals, select a variety of foods. Choose from the bread and starch, vegetable, fruit, dairy, and meat/protein groups. Examples of foods from each group and their suggested serving sizes are listed below. Use measuring cups and spoons to become familiar with what a healthy portion looks like. Bread and Starch Each serving equals 15 grams of carbohydrates.  1 slice bread.   bagel.   cup cold cereal (unsweetened).   cup hot cereal or mashed potatoes.  1 small potato (size of a computer mouse).   cup cooked pasta or rice.   English muffin.  1 cup broth-based soup.  3 cups of popcorn.  4 to 6 whole-wheat crackers.   cup cooked beans, peas, or corn. Vegetable Each serving equals 5 grams of carbohydrates.   cup cooked vegetables.  1   cup raw vegetables.   cup tomato or vegetable juice. Fruit Each serving equals 15 grams of carbohydrates.  1 small apple or orange.  1 cup watermelon or strawberries.   cup applesauce (no sugar added).  2 tbs raisins.   banana.   cup canned fruit, packed in water, its own juice, or sweetened with a sugar substitute.   cup unsweetened fruit juice. Dairy Each serving equals 12 to 15 grams of carbohydrates.  1 cup fat-free milk.  6  oz artificially sweetened yogurt or plain yogurt.  1 cup low-fat buttermilk.  1 cup soy milk.  1 cup almond milk. Meat/Protein  1 large egg.  2 to 3 oz meat, poultry, or fish.   cup low-fat cottage cheese.  1 tbs peanut butter.  1 oz low-fat cheese.   cup tuna in water.   cup tofu. Fat  1 tsp oil.  1 tsp trans-fat-free margarine.  1 tsp butter.  1 tsp mayonnaise.  2 tbs avocado.  1 tbs salad dressing.  1 tbs cream cheese.  2 tbs sour cream.  SAMPLE 1800 CALORIE DIET PLAN Breakfast   cup unsweetened cereal (1 carb serving).  1 cup fat-free milk (1 carb serving).  1 slice whole-wheat toast (1 carb serving).   small banana (1 carb serving).  1 scrambled egg.  1 tsp trans-fat-free margarine. Lunch  Tuna sandwich.  2 slices whole-wheat bread (2 carb servings).   cup canned tuna in water, drained.  1 tbs reduced fat mayonnaise.  1 stalk celery, chopped.  2 slices tomato.  1 lettuce leaf.  1 cup carrot sticks.  24 to 30 seedless grapes (2 carb servings).  6 oz light yogurt (1 carb serving). Afternoon Snack  3 graham cracker squares (1 carb serving).  Fat-free milk, 1 cup (1 carb serving).  1 tbs peanut butter. Dinner  3 oz salmon, broiled with 1 tsp oil.  1 cup mashed potatoes (2 carb servings) with 1 tsp trans-fat-free margarine.  1 cup fresh or frozen green beans.  1 cup steamed asparagus.  1 cup fat-free milk (1 carb serving). Evening Snack  3 cups air-popped popcorn (1 carb serving).  2 tbs parmesan cheese sprinkled on top.  MEAL PLAN Use this worksheet to help you make a daily meal plan based on the 1800 calorie diet suggestions. If you are using this plan to help you control your blood glucose, you may interchange carbohydrate-containing foods (dairy, starches, and fruits). Select a variety of fresh foods of varying colors and flavors. The total amount of carbohydrate in your meals or snacks is more important  than making sure you include all of the food groups every time you eat. Choose from the following foods to build your day's meals:  8 Starches.  4 Vegetables.  3 Fruits.  2 Dairy.  6 to 7 oz Meat/Protein.  Up to 4 Fats.  Your dietician can use this worksheet to help you decide how many servings and which types of foods are right for you. BREAKFAST Food Group and Servings / Food Choice Starch ________________________________________________________ Dairy _________________________________________________________ Fruit _________________________________________________________ Meat/Protein __________________________________________________ Fat ___________________________________________________________ LUNCH Food Group and Servings / Food Choice Starch ________________________________________________________ Meat/Protein __________________________________________________ Vegetable _____________________________________________________ Fruit _________________________________________________________ Dairy _________________________________________________________ Fat ___________________________________________________________ AFTERNOON SNACK Food Group and Servings / Food Choice Starch ________________________________________________________ Meat/Protein __________________________________________________ Fruit __________________________________________________________ Dairy _________________________________________________________ DINNER Food Group and Servings / Food Choice Starch _________________________________________________________ Meat/Protein ___________________________________________________ Dairy __________________________________________________________ Vegetable ______________________________________________________ Fruit ___________________________________________________________ Fat ____________________________________________________________ EVENING SNACK Food  Group and Servings / Food Choice   Fruit __________________________________________________________ Meat/Protein ___________________________________________________ Dairy __________________________________________________________ Starch _________________________________________________________ DAILY TOTALS Starch ____________________________ Vegetable _________________________ Fruit _____________________________ Dairy _____________________________ Meat/Protein______________________ Fat _______________________________ Document Released: 09/01/2004 Document Revised: 05/04/2011 Document Reviewed: 12/26/2010 [employer] Patient Information 2013 [employer].  

## 2012-11-28 NOTE — Progress Notes (Signed)
Subjective:  Subjective: Here for several concerns.   She wants to know what vaccines she needs.  She heard that she needed Hep B series.    She wants help losing weight.   Exercising daily with walking for an hour at [facility] or [employer].  Eats 3 times daily.  Eats a lot of vegetables.  No prior medication for weight loss.   Has tried OTC things like slim fast without success.   Her sister recently started using phentermine and has lost weight.  She wants to try this medication.   She does eat some fast food every other week, but mostly eats vegetables ,lean meats, fruits.  Denies sweets or junk food regularly.  She denies any chest pain or SOB with exercise.  No edema.  She did see cardiology recently, has echo this week.    She wants to go back on Lipitor.  Cardiology recommended she do this.  She was on this prior.   The following portions of the patient's history were reviewed and updated as appropriate: allergies, current medications, past family history, past medical history, past social history, past surgical history and problem list.  Review of Systems As in subjective  Past Medical History  Diagnosis Date  . Hyperlipidemia   . Hypothyroidism   . Diabetes mellitus   . Obesity   . Tubulovillous adenoma   . Wears glasses   . Hypertension   . Anemia   . Diverticula of colon     per colonoscopy6/20/11  . 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 Aortic stenosis     Echo 04/2008:  EF 60%, mean AV gradient 7 mmHg.   . Edema of lower extremity     wears ted hose  . Wears dentures     upper  . Hx of cardiovascular stress test     Adenosine Myoview 04/2008:  Normal.  . LBBB (left bundle branch block)       Objective:   Filed Vitals:   11/28/12 1607  BP: 118/80  Pulse: 80  Temp: 98.2 F (36.8 C)  Resp: 16    General appearance: alert, no distress, WD/WN, obese AA female Neck: supple, no lymphadenopathy, no thyromegaly, no  masses Heart: 2/6 systolic murmur heard best at the RUSB, otherwise normal S1, S2 Lungs: CTA bilaterally, no wheezes, rhonchi, or rales Abdomen: +bs, soft, non tender, non distended, no masses, no hepatomegaly, no splenomegaly Pulses: 2+ symmetric, upper and lower extremities, normal cap refill Ext: no edema   Assessment:   Encounter Diagnoses  Name Primary?  . Obesity, unspecified Yes  . Type II or unspecified type diabetes mellitus without mention of complication, not stated as uncontrolled   . Hyperlipidemia   . Need for prophylactic vaccination and inoculation against influenza   . Need for prophylactic vaccination against Streptococcus pneumoniae (pneumococcus)   . Need for hepatitis B vaccination      Plan:   Obesity - General weight loss/lifestyle modification strategies discussed including diet changes, identify weaknesses, use non-food rewards, set short term goals.  C/t walking regulalry for exercise.  She declines nutritionist referral.  Discussed medication options.  Discussed risks/benefits of medications.   I will discuss with cardiology before we pursue medication.    Diabetes - c/t same medication  Hyperlipidemia - reviewed recent cardiology notes;  Begin back on Lipitor QHS, use low cholesterol diet.  Recheck 12mo.  Counseled on the influenza virus vaccine.  Vaccine information sheet  given.  Influenza vaccine given after consent obtained.  Counseled on the Hepatitis B virus vaccine.  Vaccine information sheet given.  Hepatitis B vaccine given after consent obtained.  Patient was advised to return in 2 months for Hep B #2, and in 6 months for Hep B #3.    Counseled on the pneumococcal vaccine.  Vaccine information sheet given.  Pneumococcal vaccine given after consent obtained.  Follow up pending call back.

## 2012-11-30 ENCOUNTER — Other Ambulatory Visit (HOSPITAL_COMMUNITY): Payer: Self-pay | Admitting: Cardiology

## 2012-11-30 ENCOUNTER — Ambulatory Visit (HOSPITAL_COMMUNITY): Payer: Managed Care, Other (non HMO) | Attending: Physician Assistant | Admitting: Radiology

## 2012-11-30 DIAGNOSIS — I509 Heart failure, unspecified: Secondary | ICD-10-CM | POA: Insufficient documentation

## 2012-11-30 DIAGNOSIS — I350 Nonrheumatic aortic (valve) stenosis: Secondary | ICD-10-CM

## 2012-11-30 DIAGNOSIS — I359 Nonrheumatic aortic valve disorder, unspecified: Secondary | ICD-10-CM

## 2012-11-30 DIAGNOSIS — E669 Obesity, unspecified: Secondary | ICD-10-CM | POA: Insufficient documentation

## 2012-11-30 DIAGNOSIS — E119 Type 2 diabetes mellitus without complications: Secondary | ICD-10-CM | POA: Insufficient documentation

## 2012-11-30 DIAGNOSIS — I079 Rheumatic tricuspid valve disease, unspecified: Secondary | ICD-10-CM | POA: Insufficient documentation

## 2012-11-30 DIAGNOSIS — I447 Left bundle-branch block, unspecified: Secondary | ICD-10-CM | POA: Insufficient documentation

## 2012-11-30 DIAGNOSIS — E785 Hyperlipidemia, unspecified: Secondary | ICD-10-CM | POA: Insufficient documentation

## 2012-11-30 DIAGNOSIS — I10 Essential (primary) hypertension: Secondary | ICD-10-CM | POA: Insufficient documentation

## 2012-11-30 NOTE — Progress Notes (Signed)
Echocardiogram performed.  

## 2012-12-06 ENCOUNTER — Other Ambulatory Visit: Payer: Self-pay | Admitting: Medical

## 2012-12-06 MED ORDER — TOPIRAMATE 50 MG PO TABS: ORAL_TABLET | ORAL | Status: DC

## 2012-12-07 ENCOUNTER — Telehealth: Payer: Self-pay | Admitting: Family Medicine

## 2012-12-07 NOTE — Telephone Encounter (Signed)
I spoke with the patient about the Cardiologists recommendations. CLS

## 2012-12-07 NOTE — Telephone Encounter (Signed)
Message copied by Janeice Robinson on Wed Dec 07, 2012  3:26 PM ------      Message from: Jac Canavan      Created: Tue Dec 06, 2012  7:58 AM       I spoke with cardiology and reviewed her echocardiogram.  Cardiology didn't feel that we should use Belviq or Phentermine.  However, I did send Topamax.  I want her to start Topamax once daily at bedtime.  Work hard to get daily exericse, strict adherence to low calorie diet such as 1500-1800 calories daily.  Recheck in 46mo. ------

## 2012-12-29 ENCOUNTER — Other Ambulatory Visit (INDEPENDENT_AMBULATORY_CARE_PROVIDER_SITE_OTHER): Payer: Managed Care, Other (non HMO)

## 2012-12-29 DIAGNOSIS — Z23 Encounter for immunization: Secondary | ICD-10-CM

## 2013-02-17 ENCOUNTER — Other Ambulatory Visit: Payer: Self-pay | Admitting: Medical

## 2013-02-27 ENCOUNTER — Ambulatory Visit (INDEPENDENT_AMBULATORY_CARE_PROVIDER_SITE_OTHER): Payer: Managed Care, Other (non HMO) | Admitting: Medical

## 2013-02-27 ENCOUNTER — Encounter: Payer: Self-pay | Admitting: Medical

## 2013-02-27 VITALS — BP 130/80 | HR 60 | Temp 98.0°F | Resp 16 | Wt 237.0 lb

## 2013-02-27 DIAGNOSIS — E039 Hypothyroidism, unspecified: Secondary | ICD-10-CM

## 2013-02-27 DIAGNOSIS — E669 Obesity, unspecified: Secondary | ICD-10-CM

## 2013-02-27 DIAGNOSIS — E785 Hyperlipidemia, unspecified: Secondary | ICD-10-CM

## 2013-02-27 DIAGNOSIS — E119 Type 2 diabetes mellitus without complications: Secondary | ICD-10-CM

## 2013-02-27 NOTE — Progress Notes (Signed)
  Subjective:    Kathryn [name] is a 63 y.o. female who presents for follow-up of Type 2 diabetes mellitus, hyperlipidemia, hypertension, obesity, hypothyroidism.  I last saw Kathryn [name] in October.  She notes that she has run out of all Kathryn [name] medications, says the pharmacy called Kathryn [name] and told Kathryn [name] she had no more refills left and the an appointment.  She says she never really got an answer on the weight loss medication we discussed last visit. She is exercising 3 days a week with walking. Was doing more until it got colder.  Trying to eat healthy.    Home blood sugar records: she does not check Kathryn [name] sugars  Current symptoms/problems include none  Daily foot checks: yes   Any foot concerns: none Last eye exam:  More than a year ago   Medication compliance: Current diet: in general, a "healthy" diet   Current exercise: walking Known diabetic complications: none Cardiovascular risk factors: advanced age (older than 6555 for men, 4765 for women), diabetes mellitus, dyslipidemia and obesity (BMI >= 30 kg/m2)  The following portions of the patient's history were reviewed and updated as appropriate: allergies, current medications, past family history, past medical history, past social history, past surgical history and problem list.  ROS as in subjective above    Objective:    BP 130/80  Pulse 60  Temp(Src) 98 F (36.7 C) (Oral)  Resp 16  Wt 237 lb (107.502 kg)  General appearance: alert, no distress, WD/WN, obese AA female  Neck: supple, no lymphadenopathy, no thyromegaly, no masses  Heart: 2/6 systolic murmur heard best at the RUSB, otherwise normal S1, S2  Lungs: CTA bilaterally, no wheezes, rhonchi, or rales  Abdomen: +bs, soft, non tender, non distended, no masses, no hepatomegaly, no splenomegaly  Pulses: 2+ symmetric, upper and lower extremities, normal cap refill  Ext: no edema    Assessment:    Encounter Diagnoses  Name Primary?  . Type II or unspecified type diabetes mellitus  without mention of complication, not stated as uncontrolled Yes  . Hyperlipidemia   . Unspecified hypothyroidism   . Obesity      Plan:   She has started back on the Lipitor and metformin, hemoglobin A1c improved to 6.1% today  There apparently was some type of disconnect after Kathryn [name] last visit. She should not be out of the medication, as I had refill of all Kathryn [name] medicines last visit. We also called Kathryn [name] pharmacy today to confirm that she does in fact have up-to-date medicines on file with refills.  The Topamax was sent out after Kathryn [name] last visit after I discussed with Kathryn [name] cardiologist, however, she never started the medicine, says she didn't realize she had a prescription there at the pharmacy.  Patient Instructions  Obesity-  Begin Topamax 50 mg, one half tablet at bedtime daily for one week, then go up to 1 tablet daily at bedtime.  After 2 weeks she can increase this to 2 tablets at bedtime.  Eat small portions throughout the day, eat. a healthy low fat low carbohydrate diet  Try and get exercise daily  High blood pressure- continue taking lisinopril hydrochlorothiazide 10/12.5 mg daily in the morning  Cholesterol-continue Lipitor 20 mg at bedtime every day  Diabetes-continue metformin 500 mg daily in the morning  Hypothyroidism-continue Synthroid 100 mcg daily in the morning and hour before breakfast on an empty stomach  Let us see you back in 6 weeks regarding weight

## 2013-02-27 NOTE — Patient Instructions (Signed)
Obesity-  Begin Topamax 50 mg, one half tablet at bedtime daily for one week, then go up to 1 tablet daily at bedtime.  After 2 weeks she can increase this to 2 tablets at bedtime.  Eat small portions throughout the day, eat. a healthy low fat low carbohydrate diet  Try and get exercise daily  High blood pressure- continue taking lisinopril hydrochlorothiazide 10/12.5 mg daily in the morning  Cholesterol-continue Lipitor 20 mg at bedtime every day  Diabetes-continue metformin 500 mg daily in the morning  Hypothyroidism-continue Synthroid 100 mcg daily in the morning and hour before breakfast on an empty stomach  Let us see you back in 6 weeks regarding weight

## 2013-02-28 LAB — POCT GLYCOSYLATED HEMOGLOBIN (HGB A1C): Hemoglobin A1C: 6.1

## 2013-04-10 ENCOUNTER — Ambulatory Visit (INDEPENDENT_AMBULATORY_CARE_PROVIDER_SITE_OTHER): Payer: Managed Care, Other (non HMO) | Admitting: Medical

## 2013-04-10 ENCOUNTER — Encounter: Payer: Self-pay | Admitting: Medical

## 2013-04-10 ENCOUNTER — Telehealth: Payer: Self-pay | Admitting: Medical

## 2013-04-10 VITALS — BP 150/90 | HR 62 | Temp 98.2°F | Resp 14 | Wt 229.0 lb

## 2013-04-10 DIAGNOSIS — I10 Essential (primary) hypertension: Secondary | ICD-10-CM

## 2013-04-10 DIAGNOSIS — E669 Obesity, unspecified: Secondary | ICD-10-CM

## 2013-04-10 DIAGNOSIS — E039 Hypothyroidism, unspecified: Secondary | ICD-10-CM

## 2013-04-10 DIAGNOSIS — E7800 Pure hypercholesterolemia, unspecified: Secondary | ICD-10-CM

## 2013-04-10 DIAGNOSIS — E119 Type 2 diabetes mellitus without complications: Secondary | ICD-10-CM

## 2013-04-10 MED ORDER — CETIRIZINE HCL 10 MG PO TABS: 10.0000 mg | ORAL_TABLET | Freq: Every day | ORAL | Status: DC

## 2013-04-10 NOTE — Progress Notes (Signed)
  Subjective:    Kathryn [name] is a 63 y.o. female who presents for follow-up.  I saw her a little over 1 month ago.   Since last visit she has resumed her medications after having not been here in a while for routine f/u.   She has no medication side effects or problems at this time.   She requested medication for obesity/weight loss last time and has lost almost 10 lb.  Eating healthy, still trying to get some exercise with walking.     ESSENTIAL HYPERTENSION, BENIGN Taking medication daily without c/o  HYPOTHYROIDISM Taking her medication without c/o  PURE HYPERCHOLESTEROLEMIA Compliant with medication without c/o  ROS as in subjective  Objective:    BP 150/90  Pulse 62  Temp(Src) 98.2 F (36.8 C) (Oral)  Resp 14  Wt 229 lb (103.874 kg)  General appearance: alert, no distress, WD/WN, obese AA female  Neck: supple, no lymphadenopathy, no thyromegaly, no masses  Heart: 2/6 systolic murmur heard best at the RUSB, otherwise normal S1, S2  Lungs: CTA bilaterally, no wheezes, rhonchi, or rales  Pulses: 2+ symmetric, upper and lower extremities, normal cap refill  Ext: no edema Neuro: nonfocal exam    Assessment:    Encounter Diagnoses  Name Primary?  . Type II or unspecified type diabetes mellitus without mention of complication, not stated as uncontrolled Yes  . Essential hypertension, benign   . HYPOTHYROIDISM   . Pure hypercholesterolemia   . Obesity, unspecified      Plan:   She has restarted medication.  She is non fasting today.  She will come in this week for fasting labs.  She has lost almost 10lb on Topamax so far.   C/t healthy diet, exercise, same medication, but keep Topamax at 50mg  QHS.  Addendum: After looking over her chart, speaking with my nurse and front office, we have concerns about her memory.  Not sure if anything to be concerned about, but when she returns for labs this week, we will check MMSE.  Of note, when she returned in August 2014,  she hadn't been here since 2012 with no good explanation.  At her last visit there was a miscommunication.  Today she didn't seem odd or altered mental status, but some of her replies to the front office made then wonder about her memory.    We will keep a close eye on things.

## 2013-04-10 NOTE — Assessment & Plan Note (Signed)
Taking medication daily without c/o

## 2013-04-10 NOTE — Patient Instructions (Signed)
Begin Zyrtec allergy medication 1 tablet at bedtime nightly  continue Topamax at 50mg /1 tablet at bedtime.  Don't increase to 2 tablets for right now.  continue all the other medications as usual.   Return this week fasting (nothing to eat after 12 midnight), then come in at 8:30 for fasting lab draw.

## 2013-04-10 NOTE — Assessment & Plan Note (Signed)
Compliant with medication without c/o. 

## 2013-04-10 NOTE — Assessment & Plan Note (Signed)
Taking her medication without c/o
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
## 2013-04-13 ENCOUNTER — Telehealth: Payer: Self-pay | Admitting: *Deleted

## 2013-04-13 ENCOUNTER — Other Ambulatory Visit: Payer: Managed Care, Other (non HMO)

## 2013-04-13 DIAGNOSIS — E119 Type 2 diabetes mellitus without complications: Secondary | ICD-10-CM

## 2013-04-13 DIAGNOSIS — E039 Hypothyroidism, unspecified: Secondary | ICD-10-CM

## 2013-04-13 DIAGNOSIS — E7800 Pure hypercholesterolemia, unspecified: Secondary | ICD-10-CM

## 2013-04-13 LAB — COMPREHENSIVE METABOLIC PANEL
ALT: 18 U/L (ref 0–35)
AST: 17 U/L (ref 0–37)
Albumin: 4.1 g/dL (ref 3.5–5.2)
Alkaline Phosphatase: 86 U/L (ref 39–117)
BUN: 12 mg/dL (ref 6–23)
CO2: 24 mEq/L (ref 19–32)
Calcium: 9.9 mg/dL (ref 8.4–10.5)
Chloride: 107 mEq/L (ref 96–112)
Creat: 0.78 mg/dL (ref 0.50–1.10)
Glucose, Bld: 106 mg/dL — ABNORMAL HIGH (ref 70–99)
Potassium: 3.9 mEq/L (ref 3.5–5.3)
Sodium: 140 mEq/L (ref 135–145)
Total Bilirubin: 0.6 mg/dL (ref 0.2–1.2)
Total Protein: 7.4 g/dL (ref 6.0–8.3)

## 2013-04-13 LAB — LIPID PANEL
Cholesterol: 97 mg/dL (ref 0–200)
HDL: 33 mg/dL — ABNORMAL LOW (ref >39)
LDL Cholesterol: 47 mg/dL (ref 0–99)
Total CHOL/HDL Ratio: 2.9 Ratio
Triglycerides: 83 mg/dL (ref <150)
VLDL: 17 mg/dL (ref 0–40)

## 2013-04-13 LAB — HEMOGLOBIN A1C
Hgb A1c MFr Bld: 6.2 % — ABNORMAL HIGH (ref <5.7)
Mean Plasma Glucose: 131 mg/dL — ABNORMAL HIGH (ref <117)

## 2013-04-13 LAB — TSH: TSH: 0.147 u[IU]/mL — ABNORMAL LOW (ref 0.350–4.500)

## 2013-04-13 LAB — T4, FREE: Free T4: 1.31 ng/dL (ref 0.80–1.80)

## 2013-04-13 NOTE — Telephone Encounter (Signed)
Patient was in for labs this am. I did the mini-mental status exam as you requested. Patient scored 30 out of 32. The test is on your test if you would like to see it, thanks.

## 2013-04-14 LAB — MICROALBUMIN / CREATININE URINE RATIO
Creatinine, Urine: 121 mg/dL
Microalb Creat Ratio: 4.6 mg/g (ref 0.0–30.0)
Microalb, Ur: 0.56 mg/dL (ref 0.00–1.89)

## 2013-04-14 NOTE — Telephone Encounter (Signed)
Kathryn [name] - thanks for doing the MMSE.  Of note, the score is based on 30 points.   I reviewed the answers and she actually scored 29/30.  You can't score higher than 30.   We can go over this when you get a moment.    Kathryn [name] - we should be able to bill for MMSE, she came in for nurse visit, so please help me bill on the nurse visit for MMSE administration.
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
## 2013-04-26 ENCOUNTER — Other Ambulatory Visit: Payer: Self-pay | Admitting: Medical

## 2013-07-06 ENCOUNTER — Other Ambulatory Visit: Payer: Self-pay | Admitting: Medical

## 2013-07-06 NOTE — Telephone Encounter (Signed)
Kathryn HewsShane ok to RF x how many months?

## 2013-08-14 ENCOUNTER — Other Ambulatory Visit: Payer: Self-pay | Admitting: Medical

## 2013-08-15 NOTE — Telephone Encounter (Signed)
Is this okay to refill? 

## 2013-08-21 ENCOUNTER — Other Ambulatory Visit: Payer: Self-pay | Admitting: Medical

## 2013-11-03 ENCOUNTER — Encounter: Payer: Managed Care, Other (non HMO) | Admitting: Medical

## 2013-11-27 ENCOUNTER — Encounter: Payer: Self-pay | Admitting: Medical

## 2014-01-17 ENCOUNTER — Other Ambulatory Visit: Payer: Self-pay | Admitting: Medical

## 2014-03-03 ENCOUNTER — Other Ambulatory Visit: Payer: Self-pay | Admitting: Medical

## 2014-03-15 ENCOUNTER — Encounter: Payer: Self-pay | Admitting: Medical

## 2014-03-15 ENCOUNTER — Ambulatory Visit (INDEPENDENT_AMBULATORY_CARE_PROVIDER_SITE_OTHER): Payer: Managed Care, Other (non HMO) | Admitting: Medical

## 2014-03-15 VITALS — BP 128/80 | HR 72 | Temp 98.2°F | Resp 15 | Wt 236.0 lb

## 2014-03-15 DIAGNOSIS — E119 Type 2 diabetes mellitus without complications: Secondary | ICD-10-CM

## 2014-03-15 DIAGNOSIS — E669 Obesity, unspecified: Secondary | ICD-10-CM

## 2014-03-15 DIAGNOSIS — I10 Essential (primary) hypertension: Secondary | ICD-10-CM

## 2014-03-15 DIAGNOSIS — Z23 Encounter for immunization: Secondary | ICD-10-CM

## 2014-03-15 DIAGNOSIS — E785 Hyperlipidemia, unspecified: Secondary | ICD-10-CM

## 2014-03-15 DIAGNOSIS — E038 Other specified hypothyroidism: Secondary | ICD-10-CM

## 2014-03-15 LAB — COMPREHENSIVE METABOLIC PANEL WITH GFR
ALT: 46 U/L — ABNORMAL HIGH (ref 0–35)
AST: 38 U/L — ABNORMAL HIGH (ref 0–37)
Albumin: 3.9 g/dL (ref 3.5–5.2)
Alkaline Phosphatase: 93 U/L (ref 39–117)
BUN: 12 mg/dL (ref 6–23)
CO2: 27 meq/L (ref 19–32)
Calcium: 9.9 mg/dL (ref 8.4–10.5)
Chloride: 107 meq/L (ref 96–112)
Creat: 0.78 mg/dL (ref 0.50–1.10)
Glucose, Bld: 78 mg/dL (ref 70–99)
Potassium: 4.1 meq/L (ref 3.5–5.3)
Sodium: 144 meq/L (ref 135–145)
Total Bilirubin: 0.6 mg/dL (ref 0.2–1.2)
Total Protein: 7.2 g/dL (ref 6.0–8.3)

## 2014-03-15 LAB — LIPID PANEL
Cholesterol: 117 mg/dL (ref 0–200)
HDL: 42 mg/dL (ref >39)
LDL Cholesterol: 54 mg/dL (ref 0–99)
Total CHOL/HDL Ratio: 2.8 Ratio
Triglycerides: 107 mg/dL (ref <150)
VLDL: 21 mg/dL (ref 0–40)

## 2014-03-15 LAB — CBC
HCT: 37 % (ref 36.0–46.0)
Hemoglobin: 11.8 g/dL — ABNORMAL LOW (ref 12.0–15.0)
MCH: 24.8 pg — ABNORMAL LOW (ref 26.0–34.0)
MCHC: 31.9 g/dL (ref 30.0–36.0)
MCV: 77.9 fL — ABNORMAL LOW (ref 78.0–100.0)
MPV: 9.6 fL (ref 8.6–12.4)
Platelets: 287 10*3/uL (ref 150–400)
RBC: 4.75 MIL/uL (ref 3.87–5.11)
RDW: 16.8 % — ABNORMAL HIGH (ref 11.5–15.5)
WBC: 5.3 10*3/uL (ref 4.0–10.5)

## 2014-03-15 LAB — HEMOGLOBIN A1C
Hgb A1c MFr Bld: 6 % — ABNORMAL HIGH (ref <5.7)
Mean Plasma Glucose: 126 mg/dL — ABNORMAL HIGH (ref <117)

## 2014-03-15 LAB — TSH: TSH: 2.694 u[IU]/mL (ref 0.350–4.500)

## 2014-03-15 LAB — T4, FREE: Free T4: 1.04 ng/dL (ref 0.80–1.80)

## 2014-03-15 NOTE — Progress Notes (Signed)
  Subjective:    Kathryn [name] is a 64 y.o. female who presents for med check on numerous medications, chronic issues. Her last visit was about a year ago although she was due back a month after last visit for weight loss efforts and medication.  diabetes type 2 - she is taking metformin 500 mg once daily, last eye Dr. visit over a year ago, she does check her feet daily. She reports eating a healthy diet, exercising 2-3 days per week though. Drinks a good amount of water she reports.  Checks glucose once weekly but couldn't give me a specific number that she sees.  No foot concerns. No hypoglycemic episodes.  Diet recall-Breakfast eggs, toast or grits.  Sometimes toast and boiled.  Lunch typically fruit.  Only meats she eats is fish or chicken.  No red meat.   Dinner - eats greens, potatoes sometimes, cornbread sometimes.  vegetables most days.  Drinks mostly tea unsweetened.  Drinks occasional juice.  Hypertension-Checks BP, always good.  Compliant with medications without complaint  Hyperlipidemia-compliant with medications without complaint  Hypothyroidism-compliant with medications, no recent changes in weight no reported symptoms  She requests a refill medication help lose weight   She has questions about vaccines  ROS as in subjective  Objective:    BP 128/80 mmHg  Pulse 72  Temp(Src) 98.2 F (36.8 C) (Oral)  Resp 15  Wt 236 lb (107.049 kg)  General appearance: alert, no distress, WD/WN, obese AA female  Neck: supple, no lymphadenopathy, no thyromegaly, no masses  Heart: 2/6 systolic murmur heard best at the RUSB, otherwise normal S1, S2  Lungs: CTA bilaterally, no wheezes, rhonchi, or rales  Pulses: 2+ symmetric, upper and lower extremities, normal cap refill  Ext: no edema Neuro: nonfocal exam    Assessment:   Encounter Diagnoses  Name Primary?  . Diabetes type 2, controlled Yes  . Essential hypertension   . Hyperlipidemia   . Other specified hypothyroidism    . Need for prophylactic vaccination and inoculation against influenza   . Need for hepatitis B vaccination   . Obesity      Plan:   Diabetes type 2-continue current medication, labs today, see eye Dr. yearly, check feet daily, discussed the need for more frequent follow-up and then yearly Hypertension-continue current medication, labs today Hyperlipidemia-continue current medication, labs today Hypothyroidism-continue current medication, labs today Counseled on the influenza virus vaccine.  Vaccine information sheet given.  Influenza vaccine given after consent obtained. Counseled on the Hepatitis B virus vaccine.  Vaccine information sheet given.  Hepatitis B vaccine given after consent obtained.  Patient was advised to return in 2 months for Hep B #2, and in 6 months for Hep B #3.   Obesity-discussed the need for more frequent exercise, improved diet, and will need to return to discuss weight loss efforts or medications pending labs Medications will need to be refilled pending labs

## 2014-03-16 ENCOUNTER — Other Ambulatory Visit: Payer: Self-pay | Admitting: Medical

## 2014-03-16 MED ORDER — LISINOPRIL-HYDROCHLOROTHIAZIDE 10-12.5 MG PO TABS: 1.0000 | ORAL_TABLET | Freq: Every day | ORAL | Status: DC

## 2014-03-16 MED ORDER — LEVOTHYROXINE SODIUM 100 MCG PO TABS: 100.0000 ug | ORAL_TABLET | Freq: Every day | ORAL | Status: DC

## 2014-03-16 MED ORDER — ATORVASTATIN CALCIUM 20 MG PO TABS: 20.0000 mg | ORAL_TABLET | Freq: Every day | ORAL | Status: DC

## 2014-03-16 MED ORDER — DAPAGLIFLOZIN PROPANEDIOL 5 MG PO TABS: 5.0000 mg | ORAL_TABLET | Freq: Every day | ORAL | Status: DC

## 2014-03-16 MED ORDER — METFORMIN HCL 500 MG PO TABS: 500.0000 mg | ORAL_TABLET | Freq: Every day | ORAL | Status: DC

## 2014-03-29 ENCOUNTER — Encounter: Payer: Self-pay | Admitting: Medical

## 2014-03-29 ENCOUNTER — Ambulatory Visit (INDEPENDENT_AMBULATORY_CARE_PROVIDER_SITE_OTHER): Payer: Managed Care, Other (non HMO) | Admitting: Medical

## 2014-03-29 ENCOUNTER — Other Ambulatory Visit (HOSPITAL_COMMUNITY)
Admission: RE | Admit: 2014-03-29 | Discharge: 2014-03-29 | Disposition: A | Discharge status: Home / Self Care | Payer: Managed Care, Other (non HMO) | Source: Ambulatory Visit | Attending: Medical | Admitting: Medical

## 2014-03-29 ENCOUNTER — Telehealth: Payer: Self-pay | Admitting: Medical

## 2014-03-29 VITALS — BP 120/78 | HR 76 | Temp 98.3°F | Resp 15 | Ht 67.0 in | Wt 230.0 lb

## 2014-03-29 DIAGNOSIS — J309 Allergic rhinitis, unspecified: Secondary | ICD-10-CM | POA: Insufficient documentation

## 2014-03-29 DIAGNOSIS — R21 Rash and other nonspecific skin eruption: Secondary | ICD-10-CM

## 2014-03-29 DIAGNOSIS — Z0000 Encounter for general adult medical examination without abnormal findings: Secondary | ICD-10-CM

## 2014-03-29 DIAGNOSIS — I350 Nonrheumatic aortic (valve) stenosis: Secondary | ICD-10-CM | POA: Insufficient documentation

## 2014-03-29 DIAGNOSIS — Z01419 Encounter for gynecological examination (general) (routine) without abnormal findings: Secondary | ICD-10-CM | POA: Insufficient documentation

## 2014-03-29 DIAGNOSIS — I10 Essential (primary) hypertension: Secondary | ICD-10-CM

## 2014-03-29 DIAGNOSIS — E038 Other specified hypothyroidism: Secondary | ICD-10-CM

## 2014-03-29 DIAGNOSIS — E785 Hyperlipidemia, unspecified: Secondary | ICD-10-CM | POA: Insufficient documentation

## 2014-03-29 DIAGNOSIS — E039 Hypothyroidism, unspecified: Secondary | ICD-10-CM | POA: Insufficient documentation

## 2014-03-29 DIAGNOSIS — R945 Abnormal results of liver function studies: Secondary | ICD-10-CM

## 2014-03-29 DIAGNOSIS — I447 Left bundle-branch block, unspecified: Secondary | ICD-10-CM | POA: Insufficient documentation

## 2014-03-29 DIAGNOSIS — Z1239 Encounter for other screening for malignant neoplasm of breast: Secondary | ICD-10-CM

## 2014-03-29 DIAGNOSIS — R7989 Other specified abnormal findings of blood chemistry: Secondary | ICD-10-CM | POA: Insufficient documentation

## 2014-03-29 DIAGNOSIS — D539 Nutritional anemia, unspecified: Secondary | ICD-10-CM

## 2014-03-29 DIAGNOSIS — Z1211 Encounter for screening for malignant neoplasm of colon: Secondary | ICD-10-CM

## 2014-03-29 DIAGNOSIS — E669 Obesity, unspecified: Secondary | ICD-10-CM

## 2014-03-29 DIAGNOSIS — K635 Polyp of colon: Secondary | ICD-10-CM | POA: Insufficient documentation

## 2014-03-29 DIAGNOSIS — L989 Disorder of the skin and subcutaneous tissue, unspecified: Secondary | ICD-10-CM | POA: Insufficient documentation

## 2014-03-29 DIAGNOSIS — E119 Type 2 diabetes mellitus without complications: Secondary | ICD-10-CM

## 2014-03-29 DIAGNOSIS — E118 Type 2 diabetes mellitus with unspecified complications: Secondary | ICD-10-CM | POA: Insufficient documentation

## 2014-03-29 DIAGNOSIS — Z124 Encounter for screening for malignant neoplasm of cervix: Secondary | ICD-10-CM

## 2014-03-29 LAB — POCT URINALYSIS DIPSTICK
Bilirubin, UA: NEGATIVE
Blood, UA: NEGATIVE
Ketones, UA: NEGATIVE
Leukocytes, UA: NEGATIVE
Nitrite, UA: NEGATIVE
Protein, UA: NEGATIVE
Spec Grav, UA: >=1.03
Urobilinogen, UA: NEGATIVE
pH, UA: 5.5

## 2014-03-29 LAB — IRON AND TIBC
%SAT: 20 % (ref 20–55)
Iron: 85 ug/dL (ref 42–145)
TIBC: 425 ug/dL (ref 250–470)
UIBC: 340 ug/dL (ref 125–400)

## 2014-03-29 MED ORDER — CETIRIZINE HCL 10 MG PO TABS: 10.0000 mg | ORAL_TABLET | Freq: Every day | ORAL | Status: DC

## 2014-03-29 MED ORDER — DAPAGLIFLOZIN PROPANEDIOL 5 MG PO TABS
5.0000 mg | ORAL_TABLET | Freq: Every day | ORAL | Status: DC
Start: 2014-03-29 — End: 2014-04-19

## 2014-03-29 NOTE — Telephone Encounter (Signed)
From her physical yesterday:  Referral for updated mammogram  Referral back to Dr. Loreta AveMann for updated colonoscopy as she is due  Recommendations:   her liver tests were elevated recently likely due to fatty liver disease, recommend she continue efforts at healthy diet exercise and weight loss   We will plan to recheck liver test in 3 months   regarding her skin concerns if she would like to be referred to dermatology   remind her to see her eye doctor yearly for diabetic screening and routine care   she last saw cardiology in 2014 , may be a good idea to see them every few years but no major concern at this time

## 2014-03-29 NOTE — Progress Notes (Signed)
Subjective:   HPI  Kathryn [name] is a 64 y.o. female who presents for a complete physical.  Preventative care:here last Last ophthalmology visit: 2014 Last dental visit:mostly all dentures Last colonoscopy:Dr. Loreta Ave 2012 Last mammogram:2006 Last gynecological exam:years ago Last EKG:2014 Last labs:here  Prior vaccinations: TD or Tdap:current Influenza:2015 Pneumococcal:unsure Shingles/Zostavax:unsure  Advanced directive:no Health care power of attorney:no Living will:no  Concerns: none  Reviewed their medical, surgical, family, social, medication, and allergy history and updated chart as appropriate.  Past Medical History  Diagnosis Date  . Hyperlipidemia   . Hypothyroidism   . Diabetes mellitus   . Obesity   . Tubulovillous adenoma   . Wears glasses   . Hypertension   . Anemia   . Diverticula of colon     per colonoscopy6/20/11  . 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 Aortic stenosis     Echo 04/2008:  EF 60%, mean AV gradient 7 mmHg.   . Edema of lower extremity     wears ted hose  . Wears dentures     upper  . Hx of cardiovascular stress test     Adenosine Myoview 04/2008:  Normal.  . LBBB (left bundle branch block)   . H/O echocardiogram 2014    EF 65%, slight gradient of aortic valve, no major findings otherwise    Past Surgical History  Procedure Laterality Date  . Colectomy  08/13/09    laparoscopic converted to open transverse colectomy w/ side to side anastomosis  . Colonoscopy  09/01/10    Dr. Charna Elizabeth; polyp, tubular adenoma; repeat 3 years  . Vein surgery  2010    right leg  . Lasik    . Carpal tunnel release      bilat    History   Social History  . Marital Status: Divorced    Spouse Name: N/A    Number of Children: N/A  . Years of Education: N/A   Occupational History  . Not on file.   Social History Main Topics  . Smoking status: Never Smoker   . Smokeless tobacco: Never Used  .  Alcohol Use: No  . Drug Use: No  . Sexual Activity: Not on file     Comment: married, 5 children, works at [employer], exrecise with walking   Other Topics Concern  . Not on file   Social History Narrative   Single, works part time at [employer], exercises with walking, lives alon    Family History  Problem Relation Age of Onset  . Cancer Father     stomach cancer  . Diabetes Sister      Current outpatient prescriptions:  .  atorvastatin (LIPITOR) 20 MG tablet, Take 1 tablet (20 mg total) by mouth daily., Disp: 90 tablet, Rfl: 0 .  dapagliflozin propanediol (FARXIGA) 5 MG TABS tablet, Take 5 mg by mouth daily., Disp: 30 tablet, Rfl: 1 .  levothyroxine (SYNTHROID, LEVOTHROID) 100 MCG tablet, Take 1 tablet (100 mcg total) by mouth daily., Disp: 90 tablet, Rfl: 0 .  lisinopril-hydrochlorothiazide (PRINZIDE,ZESTORETIC) 10-12.5 MG per tablet, Take 1 tablet by mouth daily., Disp: 90 tablet, Rfl: 0 .  metFORMIN (GLUCOPHAGE) 500 MG tablet, Take 1 tablet (500 mg total) by mouth daily with breakfast., Disp: 90 tablet, Rfl: 0 .  cetirizine (ZYRTEC) 10 MG tablet, Take 1 tablet (10 mg total) by mouth at bedtime. (Patient not taking: Reported on 03/29/2014), Disp: 30 tablet, Rfl: 11 .  hydrocortisone 2.5 %  cream, Apply topically 2 (two) times daily. (Patient not taking: Reported on 03/15/2014), Disp: 30 g, Rfl: 0  Allergies  Allergen Reactions  . Codeine       Review of Systems Constitutional: -fever, -chills, -sweats, -unexpected weight change, -decreased appetite, -fatigue Allergy: -sneezing, -itching, -congestion Dermatology: -changing moles, --rash, -lumps ENT: -runny nose, -ear pain, -sore throat, -hoarseness, -sinus pain, -teeth pain, - ringing in ears, -hearing loss, -nosebleeds Cardiology: -chest pain, -palpitations, -swelling, -difficulty breathing when lying flat, -waking up short of breath Respiratory: -cough, -shortness of breath, -difficulty breathing with exercise or  exertion, -wheezing, -coughing up blood Gastroenterology: -abdominal pain, -nausea, -vomiting, -diarrhea, -constipation, -blood in stool, -changes in bowel movement, -difficulty swallowing or eating Hematology: -bleeding, -bruising  Musculoskeletal: -joint aches, -muscle aches, -joint swelling, -back pain, -neck pain, -cramping, -changes in gait Ophthalmology: denies vision changes, eye redness, +itching, discharge Urology: -burning with urination, -difficulty urinating, -blood in urine, -urinary frequency, -urgency, -incontinence Neurology: -headache, -weakness, -tingling, -numbness, -memory loss, -falls, -dizziness Psychology: -depressed mood, -agitation, -sleep problems     Objective:   Physical Exam  BP 120/78 mmHg  Pulse 76  Temp(Src) 98.3 F (36.8 C) (Oral)  Resp 15  Ht  (1.702 m)  Wt 230 lb (104.327 kg)  BMI 36.01 kg/m2  Wt Readings from Last 3 Encounters:  03/29/14 230 lb (104.327 kg)  03/15/14 236 lb (107.049 kg)  04/10/13 229 lb (103.874 kg)   General appearance: alert, no distress, WD/WN, overweight black female Skin: right face with grouping of 5 raised flat topped papules, all brown and uniform, unchanged since childhood per pt, left temple with 1.3 cm x 4mm oval raised brown lesion, new per patient, scattered macules throughout, otherwise skin unremarkable, no worrisome lesions HEENT: normocephalic, conjunctiva/corneas normal, sclerae anicteric, PERRLA, EOMi, nares patent, no discharge or erythema, pharynx normal Oral cavity: MMM, tongue normal, upper dentures, lower teeth in good repair Neck: supple, no lymphadenopathy, no thyromegaly, no masses, normal ROM, no bruit Chest: non tender, normal shape and expansion Heart: RRR, III/VI holosystolic murmur heard upper left and right sternal border the best, otherwise normal S2 Lungs: CTA bilaterally, no wheezes, rhonchi, or rales Abdomen: +bs, soft, vertical surgical scar, non tender, non distended, no masses, no  hepatomegaly, no splenomegaly, no bruits Back: non tender, normal ROM, no scoliosis Musculoskeletal: upper extremities non tender, no obvious deformity, normal ROM throughout, lower extremities non tender, no obvious deformity, normal ROM throughout Extremities: right medial lower leg with small surgical scar from vein stripping surgery, mild varicosites of bilat lower legs anterior and posterior, no cyanosis, no clubbing Pulses: 2+ symmetric, upper and lower extremities, normal cap refill Neurological: alert, oriented x 3, CN2-12 intact, strength normal upper extremities and lower extremities, sensation normal throughout, DTRs 2+ throughout, no cerebellar signs, gait normal Psychiatric: normal affect, behavior normal, pleasant, seems to be of below average intelligence, but answers questions appropriately, A&Ox3, cognition seems ok Breast: nontender, no masses or lumps, no skin changes, no nipple discharge or inversion, no axillary lymphadenopathy Gyn: Normal external genitalia without lesions, vagina with normal mucosa, cervix without lesions, no cervical motion tenderness, no abnormal vaginal discharge.  Uterus and adnexa not enlarged, nontender, no masses.  Pap performed.  Exam chaperoned by nurse. Rectal: deferred     Assessment and Plan :    Encounter Diagnoses  Name Primary?  . Encounter for health maintenance examination in adult Yes  . Screening for cervical cancer   . Screening for breast cancer   . Screening for  colon cancer   . Colon polyp   . Deficiency anemia   . Elevated LFTs   . Essential hypertension   . Hyperlipidemia   . Diabetes type 2, controlled   . Obesity   . Allergic rhinitis, unspecified allergic rhinitis type   . Rash and nonspecific skin eruption   . Other specified hypothyroidism   . LBBB (left bundle branch block)   . Aortic stenosis    Physical exam - discussed healthy lifestyle, diet, exercise, preventative care, vaccinations, and addressed their  concerns.  Handout given. See your eye doctor yearly for routine vision care. See your dentist yearly for routine dental care including hygiene visits twice yearly. Referral for updated mammogram Referral back to Dr. Loreta AveMann for updated colonoscopy as she is due  Pap smear done today  Routine labs today  mild anemia on recent labs, additional labs today for B 12 folate and iron  Elevated LFTs likely due to fatty liver disease , plan to recheck liver test in 3 months, continue efforts to lose weight  Hypertension , hyperlipidemia, diabetes, all currently controlled on current medication, reviewed my recent visit with her for the same  Obesity - she has actually lost 6 pounds since starting Farxiga 5 mg daily. Continue efforts with exercise and healthy diet  Allergic rhinitis- refilled medications today  Rash-advised dermatology referral, she will let me know about this  Hypothyroidism controlled on current medication  Left bundle branch block -  Reviewed her last cardiology notes from 2014 an echocardiogram also from that time.   Her blood pressures controlled, she has no new symptoms Follow-up pending labs

## 2014-03-29 NOTE — Patient Instructions (Signed)
Recommendations:    You are due for repeat colonoscopy.  We will refer you back to Dr. Loreta AveMann   We will call with the additional lab results   We will set you up for mammogram for bresat cancer screening   We did a Pap smear today for cervical cancer screening   Eat a healthy low-fat diet   Exercise regular such as walking   See the eye doctor yearly for routine diabetic eye exam and routine exam in general   See your dentist yearly   continue all of your medications as usual

## 2014-03-30 ENCOUNTER — Other Ambulatory Visit: Payer: Self-pay | Admitting: Medical

## 2014-03-30 LAB — FOLATE: Folate: >20 ng/mL

## 2014-03-30 LAB — VITAMIN D 25 HYDROXY (VIT D DEFICIENCY, FRACTURES): Vit D, 25-Hydroxy: 8 ng/mL — ABNORMAL LOW (ref 30–100)

## 2014-03-30 LAB — VITAMIN B12: Vitamin B-12: 1887 pg/mL — ABNORMAL HIGH (ref 211–911)

## 2014-03-30 MED ORDER — VITAMIN D (ERGOCALCIFEROL) 1.25 MG (50000 UNIT) PO CAPS: 50000.0000 [IU] | ORAL_CAPSULE | ORAL | Status: DC

## 2014-04-03 ENCOUNTER — Encounter: Payer: Self-pay | Admitting: Family Medicine

## 2014-04-03 ENCOUNTER — Other Ambulatory Visit: Payer: Self-pay | Admitting: Medical

## 2014-04-03 DIAGNOSIS — Z1231 Encounter for screening mammogram for malignant neoplasm of breast: Secondary | ICD-10-CM

## 2014-04-03 LAB — CYTOLOGY - PAP

## 2014-04-03 NOTE — Telephone Encounter (Signed)
I called and I spoke with the patient regarding Kristian CoveyShane Tysinger PA message but I also mailed her a copy of his recommendation.

## 2014-04-10 ENCOUNTER — Ambulatory Visit
Admission: RE | Admit: 2014-04-10 | Discharge: 2014-04-10 | Disposition: A | Discharge status: Home / Self Care | Payer: Managed Care, Other (non HMO) | Source: Ambulatory Visit | Attending: Medical | Admitting: Medical

## 2014-04-10 DIAGNOSIS — Z1231 Encounter for screening mammogram for malignant neoplasm of breast: Secondary | ICD-10-CM

## 2014-04-15 ENCOUNTER — Telehealth: Payer: Self-pay | Admitting: Medical

## 2014-04-19 ENCOUNTER — Other Ambulatory Visit: Payer: Self-pay | Admitting: Medical

## 2014-04-19 MED ORDER — CANAGLIFLOZIN 100 MG PO TABS: 100.0000 mg | ORAL_TABLET | Freq: Every day | ORAL | Status: DC

## 2014-04-19 NOTE — Telephone Encounter (Signed)
Pt informed of medication change, she will call & let us know if there is any problems and let us know how she is doing.

## 2014-04-19 NOTE — Telephone Encounter (Signed)
P.A. Marcelline DeistFarxiga denied, pt must try covered alternative Invokana or Invokamet.  Do you want to switch?

## 2014-04-19 NOTE — Telephone Encounter (Signed)
Lets try invokana instead.  Med sent.

## 2014-04-27 ENCOUNTER — Telehealth: Payer: Self-pay | Admitting: Family Medicine

## 2014-04-27 NOTE — Telephone Encounter (Signed)
[facility] and the Rep. Said that she has a balance and will need to call there office first and then they will schedule her appointment.  Patient is aware of the message.

## 2014-06-16 ENCOUNTER — Other Ambulatory Visit: Payer: Self-pay | Admitting: Medical

## 2014-07-16 DIAGNOSIS — R52 Pain, unspecified: Secondary | ICD-10-CM

## 2014-07-19 ENCOUNTER — Ambulatory Visit (INDEPENDENT_AMBULATORY_CARE_PROVIDER_SITE_OTHER): Payer: Managed Care, Other (non HMO) | Admitting: Medical

## 2014-07-19 ENCOUNTER — Other Ambulatory Visit: Payer: Self-pay | Admitting: Medical

## 2014-07-19 ENCOUNTER — Encounter: Payer: Self-pay | Admitting: Medical

## 2014-07-19 VITALS — BP 116/70 | HR 68 | Temp 97.7°F | Resp 15 | Wt 242.0 lb

## 2014-07-19 DIAGNOSIS — T148 Other injury of unspecified body region: Secondary | ICD-10-CM

## 2014-07-19 DIAGNOSIS — Z202 Contact with and (suspected) exposure to infections with a predominantly sexual mode of transmission: Secondary | ICD-10-CM | POA: Diagnosis not present

## 2014-07-19 DIAGNOSIS — R300 Dysuria: Secondary | ICD-10-CM

## 2014-07-19 DIAGNOSIS — W57XXXA Bitten or stung by nonvenomous insect and other nonvenomous arthropods, initial encounter: Secondary | ICD-10-CM

## 2014-07-19 DIAGNOSIS — N898 Other specified noninflammatory disorders of vagina: Secondary | ICD-10-CM

## 2014-07-19 DIAGNOSIS — J069 Acute upper respiratory infection, unspecified: Secondary | ICD-10-CM | POA: Diagnosis not present

## 2014-07-19 LAB — POCT URINALYSIS DIPSTICK
Bilirubin, UA: NEGATIVE
Blood, UA: NEGATIVE
Glucose, UA: NEGATIVE
Ketones, UA: NEGATIVE
Leukocytes, UA: NEGATIVE
Nitrite, UA: NEGATIVE
Protein, UA: NEGATIVE
Spec Grav, UA: >=1.03
Urobilinogen, UA: NEGATIVE
pH, UA: 6

## 2014-07-19 MED ORDER — AZITHROMYCIN 500 MG PO TABS: ORAL_TABLET | ORAL | Status: DC

## 2014-07-19 MED ORDER — FLUCONAZOLE 100 MG PO TABS: ORAL_TABLET | ORAL | Status: DC

## 2014-07-19 NOTE — Progress Notes (Signed)
Subjective: Here for vaginal itching, has had some burning with urination.   Been having this 3 weeks.  Has had some vaginal discharge.  Did douche once.   Has some odor in urine.  having some urinary frequency, some urinary urgency.  No recent antibiotics.  She does note new sexual partner recently with no condoms.  No hx/o STD.  Is menopausal.    She reports 3 wk hx/o sinus pressure, head congestion, cough, scratchy throat, nasal discharge, not improving.  Using allergy pill OTC for symptoms.  Denies sick contacts.  No other aggravating or relieving factors.    Has spot on upper left back where bug bit her recently.  No other c/o.  ROS as in subjective   Objective: Filed Vitals:   07/19/14 1011  BP: 116/70  Pulse: 68  Temp: 97.7 F (36.5 C)  Resp: 15    General appearance: Alert, WD/WN, no distress                             Skin: warm, no rash                           Head: +mild maxillary sinus tenderness                            Eyes: conjunctiva normal, corneas clear, PERRLA                            Ears: pearly TMs, external ear canals normal                          Nose: septum midline, turbinates swollen, with erythema and clear discharge             Mouth/throat: MMM, tongue normal, mild pharyngeal erythema                           Neck: supple, no adenopathy, no thyromegaly, nontender                         Lungs: CTA bilaterally, no wheezes, rales, or rhonchi   Gyn: slight white discharge externally, mild erythema of vulva, otherwise cervix normal, nontender, no mass, exam chaperoned by nurse     Assessment and Plan:  Encounter Diagnoses  Name Primary?  . Dysuria Yes  . Vaginal discharge   . Venereal disease contact   . Acute upper respiratory infection   . Insect bite     Dysuria, vaginal discharge, STD contact - UA unremarkable.  Wet prep with yeasts+ otherwise normal.  Labs sent for STD screen.  Advised condoms.   Begin Diflucan.  F/u pending  labs  URI/sinuitis - zpak, rest, mucinex DM, hydrate well  Insect bite - advised good hygiene, zpak can cover for potential infection  Patient Instructions  Recommendations:  Begin Diflucan 1 tablet ever other day until done for yeast infection  Begin 3 day azithromycin for respiratory infection  Drink plenty of water daily  Hold off/stop the cholesterol medication Lipitor until done with diflucan  You can use Mucinex DM OTC for cough/congestion  We will call with the other lab results.

## 2014-07-19 NOTE — Patient Instructions (Signed)
Recommendations:  Begin Diflucan 1 tablet ever other day until done for yeast infection  Begin 3 day azithromycin for respiratory infection  Drink plenty of water daily  Hold off/stop the cholesterol medication Lipitor until done with diflucan  You can use Mucinex DM OTC for cough/congestion  We will call with the other lab results.

## 2014-07-20 LAB — GC/CHLAMYDIA PROBE AMP
CT Probe RNA: NEGATIVE
GC Probe RNA: NEGATIVE

## 2014-07-20 LAB — RPR

## 2014-07-20 LAB — HIV ANTIBODY (ROUTINE TESTING W REFLEX): HIV 1&2 Ab, 4th Generation: NONREACTIVE

## 2014-07-21 LAB — URINE CULTURE: Colony Count: 50000

## 2014-08-15 ENCOUNTER — Encounter: Payer: Self-pay | Admitting: Medical

## 2014-08-15 ENCOUNTER — Ambulatory Visit (INDEPENDENT_AMBULATORY_CARE_PROVIDER_SITE_OTHER): Payer: Managed Care, Other (non HMO) | Admitting: Medical

## 2014-08-15 ENCOUNTER — Telehealth: Payer: Self-pay | Admitting: Medical

## 2014-08-15 VITALS — BP 128/78 | HR 64 | Temp 98.0°F | Resp 14 | Wt 250.0 lb

## 2014-08-15 DIAGNOSIS — E669 Obesity, unspecified: Secondary | ICD-10-CM

## 2014-08-15 DIAGNOSIS — M791 Myalgia, unspecified site: Secondary | ICD-10-CM

## 2014-08-15 DIAGNOSIS — R21 Rash and other nonspecific skin eruption: Secondary | ICD-10-CM

## 2014-08-15 DIAGNOSIS — J329 Chronic sinusitis, unspecified: Secondary | ICD-10-CM | POA: Diagnosis not present

## 2014-08-15 DIAGNOSIS — R7989 Other specified abnormal findings of blood chemistry: Secondary | ICD-10-CM

## 2014-08-15 DIAGNOSIS — I10 Essential (primary) hypertension: Secondary | ICD-10-CM | POA: Diagnosis not present

## 2014-08-15 DIAGNOSIS — E559 Vitamin D deficiency, unspecified: Secondary | ICD-10-CM

## 2014-08-15 DIAGNOSIS — E119 Type 2 diabetes mellitus without complications: Secondary | ICD-10-CM | POA: Diagnosis not present

## 2014-08-15 DIAGNOSIS — R945 Abnormal results of liver function studies: Secondary | ICD-10-CM

## 2014-08-15 DIAGNOSIS — J029 Acute pharyngitis, unspecified: Secondary | ICD-10-CM

## 2014-08-15 DIAGNOSIS — R600 Localized edema: Secondary | ICD-10-CM | POA: Insufficient documentation

## 2014-08-15 DIAGNOSIS — R252 Cramp and spasm: Secondary | ICD-10-CM | POA: Diagnosis not present

## 2014-08-15 DIAGNOSIS — R609 Edema, unspecified: Secondary | ICD-10-CM

## 2014-08-15 DIAGNOSIS — R0982 Postnasal drip: Secondary | ICD-10-CM

## 2014-08-15 LAB — COMPREHENSIVE METABOLIC PANEL WITH GFR
ALT: 25 U/L (ref 0–35)
AST: 23 U/L (ref 0–37)
Albumin: 3.7 g/dL (ref 3.5–5.2)
Alkaline Phosphatase: 71 U/L (ref 39–117)
BUN: 12 mg/dL (ref 6–23)
CO2: 25 meq/L (ref 19–32)
Calcium: 9.3 mg/dL (ref 8.4–10.5)
Chloride: 110 meq/L (ref 96–112)
Creat: 0.73 mg/dL (ref 0.50–1.10)
Glucose, Bld: 103 mg/dL — ABNORMAL HIGH (ref 70–99)
Potassium: 4.3 meq/L (ref 3.5–5.3)
Sodium: 143 meq/L (ref 135–145)
Total Bilirubin: 0.8 mg/dL (ref 0.2–1.2)
Total Protein: 6.7 g/dL (ref 6.0–8.3)

## 2014-08-15 LAB — D-DIMER, QUANTITATIVE: D-Dimer, Quant: 0.35 ug{FEU}/mL (ref 0.00–0.48)

## 2014-08-15 LAB — CK: Total CK: 288 U/L — ABNORMAL HIGH (ref 7–177)

## 2014-08-15 LAB — POCT GLYCOSYLATED HEMOGLOBIN (HGB A1C): Hemoglobin A1C: 6

## 2014-08-15 MED ORDER — LISINOPRIL 20 MG PO TABS: 20.0000 mg | ORAL_TABLET | Freq: Every day | ORAL | Status: DC

## 2014-08-15 MED ORDER — CANAGLIFLOZIN-METFORMIN HCL 50-500 MG PO TABS: 1.0000 | ORAL_TABLET | Freq: Two times a day (BID) | ORAL | Status: DC

## 2014-08-15 MED ORDER — AZELASTINE-FLUTICASONE 137-50 MCG/ACT NA SUSP: 1.0000 | Freq: Two times a day (BID) | NASAL | Status: DC

## 2014-08-15 MED ORDER — ATORVASTATIN CALCIUM 20 MG PO TABS: 20.0000 mg | ORAL_TABLET | Freq: Every day | ORAL | Status: DC

## 2014-08-15 MED ORDER — FUROSEMIDE 40 MG PO TABS: 40.0000 mg | ORAL_TABLET | Freq: Every day | ORAL | Status: DC

## 2014-08-15 MED ORDER — HYDROCORTISONE 2.5 % EX CREA: TOPICAL_CREAM | Freq: Two times a day (BID) | CUTANEOUS | Status: DC

## 2014-08-15 MED ORDER — LEVOTHYROXINE SODIUM 100 MCG PO TABS: 100.0000 ug | ORAL_TABLET | Freq: Every day | ORAL | Status: DC

## 2014-08-15 NOTE — Progress Notes (Signed)
Subjective:   Kathryn [name] is an 64 y.o. female who presents for follow up of Type 2 diabetes mellitus and other issues.    Diabetes: Patient is not checking home blood sugars.   Home blood sugar records: patient does not check sugars Current symptoms include: legs are swelling. Patient denies legs are swelling.  Patient is checking their feet daily. Foot concerns (callous, ulcer, wound, thickened nails, toenail fungus, skin fungus, hammer toe):  No concerns Last dilated eye exam - over a year  Current treatments:  Taking metformin, but can't get clear answer from her whether she is taking invokana started last visit Medication compliance: good  Current diet: well balanced Current exercise: walking Known diabetic complications: none  Since last visit 2/ 2016, had mammogram which was normal  She complains of muscle cramps, aches intermittent. Says she drinks a lot of water.  She is taking Vit D, denies any OTC meds, no B12 or other vitamins  She notes ongoing allergy problems, runny nose, post nasal drainage, sneezing, itchy throat, itchy eyes.  Taking zyrtec daily  She notes swelling in both legs for a week or more, gets this from time to time, but worse currently.   Not wearing compression hose.   Does eat salt in diet.  Denies CP, SOB, no leg pain other than muscle cramps.    Still wants weight loss pill. Says the invokana didn't work, is exercising some.  No prior visit with dietician.   The following portions of the patient's history were reviewed and updated as appropriate: allergies, current medications, past family history, past medical history, past social history, past surgical history and problem list.  ROS as in subjective above    Objective:   BP 128/78 mmHg  Pulse 64  Temp(Src) 98 F (36.7 C) (Oral)  Resp 14  Wt 250 lb (113.399 kg)  Wt Readings from Last 3 Encounters:  08/15/14 250 lb (113.399 kg)  07/19/14 242 lb (109.77 kg)  03/29/14 230 lb  (104.327 kg)   General appearance: alert, no distress, WD/WN, overweight black female Skin: right face with grouping of 5 raised flat topped papules, all brown and uniform, unchanged since childhood per pt, left temple with 1.3 cm x 4mm oval raised brown lesion, new per patient, scattered macules throughout, otherwise skin unremarkable, no worrisome lesions HEENT: normocephalic, conjunctiva watery but no erythema, sclerae anicteric, PERRLA, EOMi, nares patent, no discharge or erythema, pharynx normal Oral cavity: MMM, tongue normal, upper dentures, lower teeth in good repair Neck: supple, no lymphadenopathy, no thyromegaly, no masses, normal ROM, no bruit Heart: RRR, III/VI holosystolic murmur heard upper left and right sternal border the best, otherwise normal S2 Lungs: CTA bilaterally, no wheezes, rhonchi, or rales Abdomen: +bs, soft, vertical surgical scar, non tender, non distended, no masses, no hepatomegaly, no splenomegaly, no bruits Back: non tender, normal ROM, no scoliosis Musculoskeletal: upper extremities non tender, no obvious deformity, normal ROM throughout, lower extremities non tender, no obvious deformity, normal ROM throughout Extremities: 1+ pitting edema bilat lower legs, R>L somewhat, mild varicosities of bilat lower legs anterior and posterior, no cyanosis, no clubbing Pulses: 2+ symmetric, upper and lower extremities, normal cap refill   Assessment:   Encounter Diagnoses  Name Primary?  . Diabetes type 2, controlled Yes  . Essential hypertension   . Edema   . Elevated LFTs   . Rash and nonspecific skin eruption   . Vitamin D deficiency   . Obesity   . Post-nasal drainage   .  Sore throat   . Myalgia   . Cramps of lower extremity, unspecified laterality     Plan:   Diabetes type 2 - change to Invokamet, need to work on lifestyle changes with diet and exercise, daily foot checks, see eye doctor soon. HTN - change to Lisinopril 20mg  daily, add Lasix 40mg  daily,  labs today Edema - labs today, add Lasix 40mg  daily, script for compression hose Elevated LFTs - recheck labs today Rash - refilled hydrocortisone for prn use Vit D deficiency - labs today Obesity - referral to nutritionist Post nasal drainage, sore throat - c/t zyrtec, add sample of dymista nasal Myalgia, cramps - labs today

## 2014-08-15 NOTE — Patient Instructions (Addendum)
Recommendations:  Diabetes type 2 - change to Invokamet twice daily.   STOP Metformin, STOP Invokana  See an eye doctor soon for diabetic eye care and routine screening.  STOP Lisinopril HCT.   BEGIN Lisinopril 20mg  daily  For swelling BEGIN Lasix 40mg  daily  Begin using compression hose.  I wrote a prescription for this today  Rash - I sent hydrocortisone cream to pharmacy to use as needed  Obesity - we are referring you to a nutritionist  Post nasal drainage, sore throat - continue zyrtec.  Begin Dymista nasal spray 1 spray twice daily  Continue all other medications as usual

## 2014-08-15 NOTE — Telephone Encounter (Signed)
Refer to nutritionist  Check on status of referral back to GI

## 2014-08-16 LAB — BRAIN NATRIURETIC PEPTIDE: Brain Natriuretic Peptide: 43.8 pg/mL (ref 0.0–100.0)

## 2014-08-16 LAB — MICROALBUMIN / CREATININE URINE RATIO
Creatinine, Urine: 117.6 mg/dL
Microalb Creat Ratio: 1.7 mg/g (ref 0.0–30.0)
Microalb, Ur: 0.2 mg/dL (ref <2.0)

## 2014-09-18 ENCOUNTER — Telehealth: Payer: Self-pay | Admitting: Medical

## 2014-09-18 NOTE — Telephone Encounter (Signed)
Received letter from [facility] that Dymista not covered need trial of generic Step 1 meds, pt states she hasn't tried any other nasal sprays and she is ok switching to whatever would be cheaper. Choices are budesonide, flunisolide, fluticasone, mometasone, triamcinolone.  Will you switch?  Also pt states the hydrocortisone cream isn't helping and wants something stronger or different sent in for her.

## 2014-09-19 ENCOUNTER — Other Ambulatory Visit: Payer: Self-pay | Admitting: Medical

## 2014-09-19 MED ORDER — TRIAMCINOLONE ACETONIDE 0.1 % EX CREA: 1.0000 "application " | TOPICAL_CREAM | Freq: Two times a day (BID) | CUTANEOUS | Status: DC

## 2014-09-19 MED ORDER — FLUTICASONE PROPIONATE 50 MCG/ACT NA SUSP: 2.0000 | Freq: Every day | NASAL | Status: DC

## 2014-09-19 NOTE — Telephone Encounter (Signed)
Please see note about Hydrocortisone cream not helping and wants something stronger

## 2014-09-19 NOTE — Telephone Encounter (Signed)
done

## 2014-09-19 NOTE — Telephone Encounter (Signed)
I changed to flonase.

## 2014-11-28 ENCOUNTER — Encounter: Payer: Self-pay | Admitting: Medical

## 2015-02-13 ENCOUNTER — Ambulatory Visit: Payer: Managed Care, Other (non HMO) | Admitting: Medical

## 2015-02-14 ENCOUNTER — Ambulatory Visit: Payer: Managed Care, Other (non HMO) | Admitting: Medical

## 2015-03-14 ENCOUNTER — Ambulatory Visit (INDEPENDENT_AMBULATORY_CARE_PROVIDER_SITE_OTHER): Payer: Managed Care, Other (non HMO) | Admitting: Medical

## 2015-03-14 ENCOUNTER — Encounter: Payer: Self-pay | Admitting: Medical

## 2015-03-14 VITALS — BP 148/92 | HR 69 | Wt 259.0 lb

## 2015-03-14 DIAGNOSIS — R609 Edema, unspecified: Secondary | ICD-10-CM | POA: Diagnosis not present

## 2015-03-14 DIAGNOSIS — E118 Type 2 diabetes mellitus with unspecified complications: Secondary | ICD-10-CM | POA: Diagnosis not present

## 2015-03-14 DIAGNOSIS — R945 Abnormal results of liver function studies: Secondary | ICD-10-CM

## 2015-03-14 DIAGNOSIS — E669 Obesity, unspecified: Secondary | ICD-10-CM

## 2015-03-14 DIAGNOSIS — I10 Essential (primary) hypertension: Secondary | ICD-10-CM | POA: Diagnosis not present

## 2015-03-14 DIAGNOSIS — J309 Allergic rhinitis, unspecified: Secondary | ICD-10-CM | POA: Diagnosis not present

## 2015-03-14 DIAGNOSIS — E038 Other specified hypothyroidism: Secondary | ICD-10-CM

## 2015-03-14 DIAGNOSIS — R21 Rash and other nonspecific skin eruption: Secondary | ICD-10-CM | POA: Diagnosis not present

## 2015-03-14 DIAGNOSIS — Z9119 Patient's noncompliance with other medical treatment and regimen: Secondary | ICD-10-CM | POA: Insufficient documentation

## 2015-03-14 DIAGNOSIS — E7800 Pure hypercholesterolemia, unspecified: Secondary | ICD-10-CM | POA: Diagnosis not present

## 2015-03-14 DIAGNOSIS — R4189 Other symptoms and signs involving cognitive functions and awareness: Secondary | ICD-10-CM

## 2015-03-14 DIAGNOSIS — Z91199 Patient's noncompliance with other medical treatment and regimen due to unspecified reason: Secondary | ICD-10-CM | POA: Insufficient documentation

## 2015-03-14 DIAGNOSIS — F09 Unspecified mental disorder due to known physiological condition: Secondary | ICD-10-CM

## 2015-03-14 DIAGNOSIS — R7989 Other specified abnormal findings of blood chemistry: Secondary | ICD-10-CM

## 2015-03-14 MED ORDER — LEVOTHYROXINE SODIUM 100 MCG PO TABS: 100.0000 ug | ORAL_TABLET | Freq: Every day | ORAL | Status: DC

## 2015-03-14 MED ORDER — FUROSEMIDE 40 MG PO TABS: 40.0000 mg | ORAL_TABLET | Freq: Every day | ORAL | Status: DC

## 2015-03-14 MED ORDER — LISINOPRIL 20 MG PO TABS: 20.0000 mg | ORAL_TABLET | Freq: Every day | ORAL | Status: DC

## 2015-03-14 MED ORDER — ATORVASTATIN CALCIUM 20 MG PO TABS: 20.0000 mg | ORAL_TABLET | Freq: Every day | ORAL | Status: DC

## 2015-03-14 MED ORDER — TRIAMCINOLONE ACETONIDE 0.1 % EX CREA: 1.0000 "application " | TOPICAL_CREAM | Freq: Two times a day (BID) | CUTANEOUS | Status: DC

## 2015-03-14 MED ORDER — TRIAMCINOLONE ACETONIDE 0.5 % EX OINT: 1.0000 "application " | TOPICAL_OINTMENT | Freq: Two times a day (BID) | CUTANEOUS | Status: DC

## 2015-03-14 MED ORDER — FLUTICASONE PROPIONATE 50 MCG/ACT NA SUSP: 2.0000 | Freq: Every day | NASAL | Status: DC

## 2015-03-14 MED ORDER — CETIRIZINE HCL 10 MG PO TABS: 10.0000 mg | ORAL_TABLET | Freq: Every day | ORAL | Status: DC

## 2015-03-14 MED ORDER — CANAGLIFLOZIN-METFORMIN HCL 50-500 MG PO TABS: 1.0000 | ORAL_TABLET | Freq: Two times a day (BID) | ORAL | Status: DC

## 2015-03-14 NOTE — Progress Notes (Signed)
Subjective:   Kathryn [name] is an 65 y.o. female who presents for follow up of Type 2 diabetes mellitus and other issues.    She is not checking glucose or BP.  She notes running out of all her medications 3 months ago.   She didn't call us to advise.   She is feeling off since being off her thyroid medication.    She was taking all of her medications prior to 3 mo ago.  having nasal congestion, sneezing, needs her nasal spray.   Legs are swelling since being out of Lasix.   No other new issues, but needs refills.  She still c/o rash on arms, and prior creams we prescribed didn't help.  Uses lotion occasionally.  Past Medical History  Diagnosis Date  . Hyperlipidemia   . Hypothyroidism   . Diabetes mellitus   . Obesity   . Tubulovillous adenoma   . Wears glasses   . Hypertension   . Anemia   . Diverticula of colon     per colonoscopy6/20/11  . 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 Aortic stenosis     Echo 04/2008:  EF 60%, mean AV gradient 7 mmHg.   . Edema of lower extremity     wears ted hose  . Wears dentures     upper  . Hx of cardiovascular stress test     Adenosine Myoview 04/2008:  Normal.  . LBBB (left bundle branch block)   . H/O echocardiogram 2014    EF 65%, slight gradient of aortic valve, no major findings otherwise   ROS as in subjective     Objective:   BP 148/92 mmHg  Pulse 69  Wt 259 lb (117.482 kg)  SpO2 99%  Wt Readings from Last 3 Encounters:  03/14/15 259 lb (117.482 kg)  08/15/14 250 lb (113.399 kg)  07/19/14 242 lb (109.77 kg)   General appearance: alert, no distress, WD/WN, overweight black female Skin: right face with grouping of 5 raised flat topped papules, all brown and uniform, unchanged since childhood per pt, left temple with 1.3 cm x 4mm oval raised brown lesion, new per patient, scattered macules throughout, otherwise skin unremarkable, no worrisome lesions HEENT: normocephalic, conjunctiva  watery but no erythema, sclerae anicteric, PERRLA, EOMi, nares patent, no discharge or erythema, pharynx normal Oral cavity: MMM, tongue normal, upper dentures, lower teeth in good repair Neck: supple, no lymphadenopathy, no thyromegaly, no masses, normal ROM, no bruit Heart: RRR, III/VI holosystolic murmur heard upper left and right sternal border the best, otherwise normal S2 Lungs: CTA bilaterally, no wheezes, rhonchi, or rales Extremities: 1+ pitting edema bilat lower legs, R>L somewhat, mild varicosities of bilat lower legs anterior and posterior, no cyanosis, no clubbing Pulses: 2+ symmetric, upper and lower extremities, normal cap refill   Assessment:   Encounter Diagnoses  Name Primary?  . Diabetes mellitus with complication (HCC) Yes  . Pure hypercholesterolemia   . Obesity   . Edema, unspecified type   . Other specified hypothyroidism   . Rash and nonspecific skin eruption   . Allergic rhinitis, unspecified allergic rhinitis type   . Elevated LFTs   . Essential hypertension   . Impaired cognitive perception pattern   . Noncompliance     Plan:   Restart all medications.   Discussed compliance, calling our office if there are issues getting refills.  Also discussed need for appt q3 mo fasting.    We will have her  return in 6wk fasting for physical and labs at that time once she is back on her medications.   Consider skin biopsy going forward if ointment doesn't help.   Advised she use [facility] for free generics given her concern over price.  Gave coupon card for [employer].   F/u 6wk.

## 2015-04-09 ENCOUNTER — Ambulatory Visit (INDEPENDENT_AMBULATORY_CARE_PROVIDER_SITE_OTHER): Payer: Managed Care, Other (non HMO) | Admitting: Family Medicine

## 2015-04-09 VITALS — BP 134/90 | HR 83 | Wt 255.0 lb

## 2015-04-09 DIAGNOSIS — B373 Candidiasis of vulva and vagina: Secondary | ICD-10-CM | POA: Diagnosis not present

## 2015-04-09 DIAGNOSIS — B3731 Acute candidiasis of vulva and vagina: Secondary | ICD-10-CM

## 2015-04-09 MED ORDER — FLUCONAZOLE 150 MG PO TABS: 150.0000 mg | ORAL_TABLET | Freq: Once | ORAL | Status: DC

## 2015-04-09 NOTE — Progress Notes (Signed)
   Subjective:    Patient ID: Kathryn [name], female    DOB: 1950-04-18, 65 y.o.   MRN: [id number]  HPI She complains of a several day history of vaginal itching. She has had previous difficulty with yeast infection   Review of Systems     Objective:   Physical Exam .Vaginal exam does show erythema with KOH being positive. Wet prep was negative. Minimal discharge was noted.       Assessment & Plan:  Yeast vaginitis - Plan: fluconazole (DIFLUCAN) 150 MG tablet I will have her take Diflucan for 2 days. She also mentioned her thyroid medication. She should've gotten 90 pills with her prescription filled and January and I told her to check with pharmacy concerning that.

## 2015-06-03 ENCOUNTER — Encounter: Payer: Self-pay | Admitting: Family Medicine

## 2015-06-03 ENCOUNTER — Ambulatory Visit (INDEPENDENT_AMBULATORY_CARE_PROVIDER_SITE_OTHER): Payer: Managed Care, Other (non HMO) | Admitting: Family Medicine

## 2015-06-03 VITALS — BP 142/84 | HR 80 | Ht 65.75 in | Wt 251.4 lb

## 2015-06-03 DIAGNOSIS — L298 Other pruritus: Secondary | ICD-10-CM

## 2015-06-03 DIAGNOSIS — B373 Candidiasis of vulva and vagina: Secondary | ICD-10-CM

## 2015-06-03 DIAGNOSIS — A499 Bacterial infection, unspecified: Secondary | ICD-10-CM | POA: Diagnosis not present

## 2015-06-03 DIAGNOSIS — N760 Acute vaginitis: Secondary | ICD-10-CM

## 2015-06-03 DIAGNOSIS — B3731 Acute candidiasis of vulva and vagina: Secondary | ICD-10-CM

## 2015-06-03 DIAGNOSIS — N898 Other specified noninflammatory disorders of vagina: Secondary | ICD-10-CM

## 2015-06-03 DIAGNOSIS — R300 Dysuria: Secondary | ICD-10-CM

## 2015-06-03 DIAGNOSIS — R81 Glycosuria: Secondary | ICD-10-CM | POA: Diagnosis not present

## 2015-06-03 DIAGNOSIS — B9689 Other specified bacterial agents as the cause of diseases classified elsewhere: Secondary | ICD-10-CM

## 2015-06-03 LAB — POCT WET PREP (WET MOUNT)
Clue Cells Wet Prep Whiff POC: POSITIVE
KOH Wet Prep POC: POSITIVE

## 2015-06-03 LAB — POCT URINALYSIS DIPSTICK
Bilirubin, UA: NEGATIVE
Blood, UA: NEGATIVE
Leukocytes, UA: NEGATIVE
Nitrite, UA: NEGATIVE
Protein, UA: NEGATIVE
Spec Grav, UA: >=1.03
Urobilinogen, UA: NEGATIVE
pH, UA: 6

## 2015-06-03 LAB — GLUCOSE, POCT (MANUAL RESULT ENTRY): POC Glucose: 97 mg/dl (ref 70–99)

## 2015-06-03 MED ORDER — METRONIDAZOLE 500 MG PO TABS: 500.0000 mg | ORAL_TABLET | Freq: Two times a day (BID) | ORAL | Status: DC

## 2015-06-03 MED ORDER — FLUCONAZOLE 150 MG PO TABS: 150.0000 mg | ORAL_TABLET | Freq: Once | ORAL | Status: DC

## 2015-06-03 NOTE — Progress Notes (Signed)
   Subjective:    Patient ID: Kathryn [name], female    DOB: June 04, 1950, 65 y.o.   MRN: [id number]  HPI Chief Complaint  Patient presents with  . Vaginal Itching    started last week around Monday with itching and burning as well as an odor. No discharge. States that she has scratched herself so much she made herself bleed. Saw Dr.Lalonde back in Feb and was given Diflucan 150mg  #2, took the two tablets and felt better but symptoms have returned.    She is here with complaints of a 1 week history of itching and burning to vaginal area that is worse with urination. Denies urinary frequency or urgency but does report painful urination. Denies fever, chills, nausea, vomiting, abdominal or back pain.  States she has not been checking her blood sugars. Also reports eating 4 honey buns over the past 24 hours.   Reviewed allergies, medications, past medical, social history.     Review of Systems Pertinent positives and negatives in the history of present illness.     Objective:   Physical Exam  Constitutional: She appears well-developed and well-nourished. No distress.  Abdominal: Soft. Bowel sounds are normal. She exhibits no distension. There is no tenderness. There is no rebound and no guarding.  Genitourinary: There is no rash on the right labia. There is no rash on the left labia. Uterus is not enlarged and not tender. Cervix exhibits no motion tenderness. Right adnexum displays no mass, no tenderness and no fullness. Left adnexum displays no mass, no tenderness and no fullness. No tenderness or bleeding in the vagina. Vaginal discharge found.  White, thick, clumpy discharge to vaginal vault and on cervix.    BP 142/84 mmHg  Pulse 80  Ht 5' 5.75" (1.67 m)  Wt 251 lb 6.4 oz (114.034 kg)  BMI 40.89 kg/m2  Urinalysis dipstick: glucose 3+ (SGLT2 medication)  Finger stick glucose: 97    Assessment & Plan:  Vaginal itching  BV (bacterial vaginosis) - Plan: metroNIDAZOLE (FLAGYL)  500 MG tablet  Candidiasis of vagina - Plan: fluconazole (DIFLUCAN) 150 MG tablet  Dysuria - Plan: POCT urinalysis dipstick  Discussed diagnosis of bacterial vaginosis and treatment for this. Metronidazole prescribed and instructions to avoid alcohol while taking this medication. She also was positive for candidiasis and Diflucan sent to pharmacy with instructions to stop atorvastatin while taking this. A refill was prescribed with instructions to take this on day 3 if she is still having vaginal itching.  She does not have a UTI today. Discussed that her diabetes medication does make her void glucose and more susceptible to yeast infections. She continues to have these, she may need to switched to a different diabetes medication. Recommend that she follow up with Kathryn [name], PCP for diabetes.

## 2015-06-03 NOTE — Patient Instructions (Addendum)
Stop taking your atorvastatin while you are taking Diflucan. Take 1 pill of Diflucan and then in 3 days take the 2nd pill if you are still having vaginal itching. Restart the Atorvastatin in 6 days.  Do not drink alcohol while taking Metronidazole or this can make you sick. I am prescribing this for your vaginal flora being off and causing you to have a bacterial infection of the vagina.   Your blood sugar today in our office was 97.  Watch your sugar and carbohydrate intake, continue taking your medication and follow up with [facility].   Bacterial Vaginosis Bacterial vaginosis is a vaginal infection that occurs when the normal balance of bacteria in the vagina is disrupted. It results from an overgrowth of certain bacteria. This is the most common vaginal infection in women of childbearing age. Treatment is important to prevent complications, especially in pregnant women, as it can cause a premature delivery. CAUSES  Bacterial vaginosis is caused by an increase in harmful bacteria that are normally present in smaller amounts in the vagina. Several different kinds of bacteria can cause bacterial vaginosis. However, the reason that the condition develops is not fully understood. RISK FACTORS Certain activities or behaviors can put you at an increased risk of developing bacterial vaginosis, including:  Having a new sex partner or multiple sex partners.  Douching.  Using an intrauterine device (IUD) for contraception. Women do not get bacterial vaginosis from toilet seats, bedding, swimming pools, or contact with objects around them. SIGNS AND SYMPTOMS  Some women with bacterial vaginosis have no signs or symptoms. Common symptoms include:  Grey vaginal discharge.  A fishlike odor with discharge, especially after sexual intercourse.  Itching or burning of the vagina and vulva.  Burning or pain with urination. DIAGNOSIS  Your health care provider will take a medical history and examine the  vagina for signs of bacterial vaginosis. A sample of vaginal fluid may be taken. Your health care provider will look at this sample under a microscope to check for bacteria and abnormal cells. A vaginal pH test may also be done.  TREATMENT  Bacterial vaginosis may be treated with antibiotic medicines. These may be given in the form of a pill or a vaginal cream. A second round of antibiotics may be prescribed if the condition comes back after treatment. Because bacterial vaginosis increases your risk for sexually transmitted diseases, getting treated can help reduce your risk for chlamydia, gonorrhea, HIV, and herpes. HOME CARE INSTRUCTIONS   Only take over-the-counter or prescription medicines as directed by your health care provider.  If antibiotic medicine was prescribed, take it as directed. Make sure you finish it even if you start to feel better.  Tell all sexual partners that you have a vaginal infection. They should see their health care provider and be treated if they have problems, such as a mild rash or itching.  During treatment, it is important that you follow these instructions:  Avoid sexual activity or use condoms correctly.  Do not douche.  Avoid alcohol as directed by your health care provider.  Avoid breastfeeding as directed by your health care provider. SEEK MEDICAL CARE IF:   Your symptoms are not improving after 3 days of treatment.  You have increased discharge or pain.  You have a fever. MAKE SURE YOU:   Understand these instructions.  Will watch your condition.  Will get help right away if you are not doing well or get worse. FOR MORE INFORMATION  Centers for Disease Control  and Prevention, Division of STD Prevention: SolutionApps.co.zawww.cdc.gov/std American Sexual Health Association (ASHA): www.ashastd.org    This information is not intended to replace advice given to you by your health care provider. Make sure you discuss any questions you have with your health care  provider.   Document Released: 02/09/2005 Document Revised: 03/02/2014 Document Reviewed: 09/21/2012 Elsevier Interactive Patient Education [employer]2016 Elsevier Inc.

## 2015-06-04 ENCOUNTER — Telehealth: Payer: Self-pay

## 2015-06-04 MED ORDER — ATORVASTATIN CALCIUM 20 MG PO TABS: 20.0000 mg | ORAL_TABLET | Freq: Every day | ORAL | Status: DC

## 2015-06-04 MED ORDER — LEVOTHYROXINE SODIUM 100 MCG PO TABS: 100.0000 ug | ORAL_TABLET | Freq: Every day | ORAL | Status: DC

## 2015-06-04 NOTE — Telephone Encounter (Signed)
Received faxed refill request for atorvastatin 20mg  #30, done

## 2015-06-04 NOTE — Telephone Encounter (Signed)
Received faxed refill request for levothyroxine 0.1mg  #30, done

## 2015-07-11 ENCOUNTER — Telehealth: Payer: Self-pay | Admitting: Medical

## 2015-07-11 MED ORDER — LEVOTHYROXINE SODIUM 100 MCG PO TABS: 100.0000 ug | ORAL_TABLET | Freq: Every day | ORAL | Status: DC

## 2015-07-11 NOTE — Telephone Encounter (Signed)
done

## 2015-07-11 NOTE — Telephone Encounter (Signed)
Pt made Med Ck appt for 07/17/15. Requesting refill on Synthroid 100 MCG to last until that appt since she is out of that med

## 2015-07-17 ENCOUNTER — Ambulatory Visit (INDEPENDENT_AMBULATORY_CARE_PROVIDER_SITE_OTHER): Payer: Managed Care, Other (non HMO) | Admitting: Medical

## 2015-07-17 ENCOUNTER — Encounter: Payer: Self-pay | Admitting: Medical

## 2015-07-17 VITALS — BP 136/90 | HR 62 | Wt 257.0 lb

## 2015-07-17 DIAGNOSIS — F09 Unspecified mental disorder due to known physiological condition: Secondary | ICD-10-CM | POA: Diagnosis not present

## 2015-07-17 DIAGNOSIS — R609 Edema, unspecified: Secondary | ICD-10-CM

## 2015-07-17 DIAGNOSIS — Z7251 High risk heterosexual behavior: Secondary | ICD-10-CM | POA: Diagnosis not present

## 2015-07-17 DIAGNOSIS — N898 Other specified noninflammatory disorders of vagina: Secondary | ICD-10-CM

## 2015-07-17 DIAGNOSIS — I10 Essential (primary) hypertension: Secondary | ICD-10-CM | POA: Diagnosis not present

## 2015-07-17 DIAGNOSIS — E118 Type 2 diabetes mellitus with unspecified complications: Secondary | ICD-10-CM | POA: Diagnosis not present

## 2015-07-17 DIAGNOSIS — E038 Other specified hypothyroidism: Secondary | ICD-10-CM | POA: Diagnosis not present

## 2015-07-17 DIAGNOSIS — Z202 Contact with and (suspected) exposure to infections with a predominantly sexual mode of transmission: Secondary | ICD-10-CM | POA: Diagnosis not present

## 2015-07-17 DIAGNOSIS — E7800 Pure hypercholesterolemia, unspecified: Secondary | ICD-10-CM

## 2015-07-17 DIAGNOSIS — R4189 Other symptoms and signs involving cognitive functions and awareness: Secondary | ICD-10-CM

## 2015-07-17 DIAGNOSIS — I447 Left bundle-branch block, unspecified: Secondary | ICD-10-CM | POA: Diagnosis not present

## 2015-07-17 DIAGNOSIS — Z9119 Patient's noncompliance with other medical treatment and regimen: Secondary | ICD-10-CM

## 2015-07-17 DIAGNOSIS — R7989 Other specified abnormal findings of blood chemistry: Secondary | ICD-10-CM

## 2015-07-17 DIAGNOSIS — R945 Abnormal results of liver function studies: Secondary | ICD-10-CM

## 2015-07-17 DIAGNOSIS — Z91199 Patient's noncompliance with other medical treatment and regimen due to unspecified reason: Secondary | ICD-10-CM

## 2015-07-17 LAB — POCT WET PREP (WET MOUNT)

## 2015-07-17 LAB — COMPREHENSIVE METABOLIC PANEL
ALT: 21 U/L (ref 6–29)
AST: 19 U/L (ref 10–35)
Albumin: 4 g/dL (ref 3.6–5.1)
Alkaline Phosphatase: 87 U/L (ref 33–130)
BUN: 8 mg/dL (ref 7–25)
CO2: 25 mmol/L (ref 20–31)
Calcium: 9.4 mg/dL (ref 8.6–10.4)
Chloride: 106 mmol/L (ref 98–110)
Creat: 0.75 mg/dL (ref 0.50–0.99)
Glucose, Bld: 89 mg/dL (ref 65–99)
Potassium: 4.4 mmol/L (ref 3.5–5.3)
Sodium: 140 mmol/L (ref 135–146)
Total Bilirubin: 0.6 mg/dL (ref 0.2–1.2)
Total Protein: 7.4 g/dL (ref 6.1–8.1)

## 2015-07-17 LAB — POCT URINALYSIS DIPSTICK
Bilirubin, UA: NEGATIVE
Blood, UA: NEGATIVE
Glucose, UA: NEGATIVE
Ketones, UA: NEGATIVE
Leukocytes, UA: NEGATIVE
Nitrite, UA: NEGATIVE
Protein, UA: NEGATIVE
Spec Grav, UA: 1.015
Urobilinogen, UA: NEGATIVE
pH, UA: 7.5

## 2015-07-17 LAB — CBC
HCT: 37.8 % (ref 35.0–45.0)
Hemoglobin: 11.4 g/dL — ABNORMAL LOW (ref 11.7–15.5)
MCH: 23.3 pg — ABNORMAL LOW (ref 27.0–33.0)
MCHC: 30.2 g/dL — ABNORMAL LOW (ref 32.0–36.0)
MCV: 77.1 fL — ABNORMAL LOW (ref 80.0–100.0)
MPV: 9.6 fL (ref 7.5–12.5)
Platelets: 293 10*3/uL (ref 140–400)
RBC: 4.9 MIL/uL (ref 3.80–5.10)
RDW: 16.8 % — ABNORMAL HIGH (ref 11.0–15.0)
WBC: 4.4 10*3/uL (ref 4.0–10.5)

## 2015-07-17 LAB — LIPID PANEL
Cholesterol: 131 mg/dL (ref 125–200)
HDL: 46 mg/dL (ref >=46)
LDL Cholesterol: 65 mg/dL (ref <130)
Total CHOL/HDL Ratio: 2.8 Ratio (ref <=5.0)
Triglycerides: 101 mg/dL (ref <150)
VLDL: 20 mg/dL (ref <30)

## 2015-07-17 LAB — TSH: TSH: 2.34 mIU/L

## 2015-07-17 LAB — HEMOGLOBIN A1C
Hgb A1c MFr Bld: 6.1 % — ABNORMAL HIGH (ref <5.7)
Mean Plasma Glucose: 128 mg/dL

## 2015-07-17 LAB — HIV ANTIBODY (ROUTINE TESTING W REFLEX): HIV 1&2 Ab, 4th Generation: NONREACTIVE

## 2015-07-17 MED ORDER — FLUTICASONE PROPIONATE 50 MCG/ACT NA SUSP: 2.0000 | Freq: Every day | NASAL | Status: DC

## 2015-07-17 MED ORDER — LEVOTHYROXINE SODIUM 100 MCG PO TABS: 100.0000 ug | ORAL_TABLET | Freq: Every day | ORAL | Status: DC

## 2015-07-17 MED ORDER — FUROSEMIDE 40 MG PO TABS: 40.0000 mg | ORAL_TABLET | Freq: Every day | ORAL | Status: DC

## 2015-07-17 MED ORDER — CANAGLIFLOZIN-METFORMIN HCL 50-500 MG PO TABS: 1.0000 | ORAL_TABLET | Freq: Two times a day (BID) | ORAL | Status: DC

## 2015-07-17 MED ORDER — CETIRIZINE HCL 10 MG PO TABS: 10.0000 mg | ORAL_TABLET | Freq: Every day | ORAL | Status: DC

## 2015-07-17 MED ORDER — VITAMIN D (ERGOCALCIFEROL) 1.25 MG (50000 UNIT) PO CAPS: 50000.0000 [IU] | ORAL_CAPSULE | ORAL | Status: DC

## 2015-07-17 MED ORDER — FLUCONAZOLE 100 MG PO TABS: ORAL_TABLET | ORAL | Status: DC

## 2015-07-17 MED ORDER — ATORVASTATIN CALCIUM 20 MG PO TABS: 20.0000 mg | ORAL_TABLET | Freq: Every day | ORAL | Status: DC

## 2015-07-17 MED ORDER — LISINOPRIL 20 MG PO TABS: 20.0000 mg | ORAL_TABLET | Freq: Every day | ORAL | Status: DC

## 2015-07-17 NOTE — Addendum Note (Signed)
Addended by: Kieth BrightlyLAWSON, Lorieann Argueta M on: 07/17/2015 03:43 PM   Modules accepted: Orders

## 2015-07-17 NOTE — Addendum Note (Signed)
Addended by: Kieth BrightlyLAWSON, Llewellyn Schoenberger M on: 07/17/2015 02:38 PM   Modules accepted: Orders

## 2015-07-17 NOTE — Progress Notes (Signed)
Subjective: Chief Complaint  Patient presents with  . Med Check    wants a refill for her yeast infection because she has sex with out a condom and it came back. states that he does drugs and thinks that causes it.  need medication refills on other medications as well.    Here for med check. However she told nurse before I came in that she has "free sex" with some people without condoms, and uses condoms at other times, apparently multiple sexual partners.  She thinks the weed her sexual partner is smoking has cocaine in it and this is causing her yeast infection.   She denies using any drugs herself.     She has hx/o hypertension, diabetes, hypercholesterolemia, hypothyroidism, edema, aortic stenosis, LBBB, thyromegaly, hx/o noncompliance and we have had concerns abut her cognition and mental capacity prior.    Diabetes - checks sugars once daily, getting "good" numbness, she endorses low 100s.  Checks feet daily for wounds and sores.   Doesn't check BPs.  Has no BP cuff.    She ran out of medication (due to noncompliance on physical appt and labs).  Allergic rhinitis - was taking a pill prior for allergies.    Exercise - walks some.    But hasn't been walking lately.  Eats some fried foods.  Denies eating a lot of fast food.   Doesn't cook much at home.    Past Medical History  Diagnosis Date  . Hyperlipidemia   . Hypothyroidism   . Diabetes mellitus   . Obesity   . Tubulovillous adenoma   . Wears glasses   . Hypertension   . Anemia   . Diverticula of colon     per colonoscopy6/20/11  . 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 Aortic stenosis     Echo 04/2008:  EF 60%, mean AV gradient 7 mmHg.   . Edema of lower extremity     wears ted hose  . Wears dentures     upper  . Hx of cardiovascular stress test     Adenosine Myoview 04/2008:  Normal.  . LBBB (left bundle branch block)   . H/O echocardiogram 2014    EF 65%, slight gradient of  aortic valve, no major findings otherwise   ROS as in subjective   Objective: BP 136/90 mmHg  Pulse 62  Wt 257 lb (116.574 kg)  BP Readings from Last 3 Encounters:  07/17/15 136/90  06/03/15 142/84  04/09/15 134/90   Wt Readings from Last 3 Encounters:  07/17/15 257 lb (116.574 kg)  06/03/15 251 lb 6.4 oz (114.034 kg)  04/09/15 255 lb (115.667 kg)   General appearance: alert, no distress, WD/WN, overweight black female Oral cavity: MMM, tongue normal, upper dentures, lower teeth in good repair Neck: supple, no lymphadenopathy, no thyromegaly, no masses, normal ROM, no bruit Heart: RRR, III/VI holosystolic murmur heard upper left and right sternal border the best, otherwise normal S2 Lungs: CTA bilaterally, no wheezes, rhonchi, or rales Extremities: 1+ pitting edema bilat lower legs, R>L somewhat, mild varicosities of bilat lower legs anterior and posterior, no cyanosis, no clubbing Pulses: 2+ symmetric, upper and lower extremities, normal cap refill Abdomen: +bs, soft, nontender, no mass, no organomegaly GU - deferred to self wet prep swab    Assessment: Encounter Diagnoses  Name Primary?  . Diabetes mellitus with complication (HCC) Yes  . High risk sexual behavior   . Pure hypercholesterolemia   . Other  specified hypothyroidism   . Essential hypertension   . Impaired cognitive perception pattern   . LBBB (left bundle branch block)   . Noncompliance   . Edema, unspecified type   . Elevated LFTs   . Venereal disease contact   . Vaginal discharge   . Unprotected sex      Plan: Labs today including STD screen, hepatitis screen.  Discussed safe sex, purchasing condoms to not rely on the partner necessarily.  Discussed high risk behaviors.     Reiterated the need for f/u q20mo with her health issues.   She has a pattern of not returning for routine f/u, but then running out of medications.   Last visit we discussed this too, but she never came back for physical and  fasting labs.  Last labs over a year ago.     Begin Diflucan for yeast infection per KOH findings.    Restart all medications.   Advised she continue to monitor glucose, advised she monitor BPs.  Discussed compliance.  F/u pending labs, f/u 26mo for routine recheck.   Demmi was seen today for med check.  Diagnoses and all orders for this visit:  Diabetes mellitus with complication (HCC) -     Comprehensive metabolic panel -     CBC -     TSH -     Hemoglobin A1c  High risk sexual behavior -     Cancel: Drug Screen, Urine -     HIV antibody -     RPR -     GC/Chlamydia Probe Amp -     Hepatitis C antibody -     Hepatitis B surface antibody -     Hepatitis B surface antigen  Pure hypercholesterolemia -     Lipid panel  Other specified hypothyroidism -     TSH  Essential hypertension  Impaired cognitive perception pattern  LBBB (left bundle branch block)  Noncompliance -     Cancel: Drug Screen, Urine  Edema, unspecified type  Elevated LFTs  Venereal disease contact -     HIV antibody -     RPR -     GC/Chlamydia Probe Amp -     Hepatitis C antibody -     Hepatitis B surface antibody -     Hepatitis B surface antigen  Vaginal discharge -     HIV antibody -     RPR -     GC/Chlamydia Probe Amp -     Hepatitis C antibody -     Hepatitis B surface antibody -     Hepatitis B surface antigen  Unprotected sex  Other orders -     Canagliflozin-Metformin HCl (INVOKAMET) 50-500 MG TABS; Take 1 tablet by mouth 2 (two) times daily. -     cetirizine (ZYRTEC) 10 MG tablet; Take 1 tablet (10 mg total) by mouth at bedtime. -     levothyroxine (SYNTHROID, LEVOTHROID) 100 MCG tablet; Take 1 tablet (100 mcg total) by mouth daily. -     fluticasone (FLONASE) 50 MCG/ACT nasal spray; Place 2 sprays into both nostrils daily. -     lisinopril (PRINIVIL,ZESTRIL) 20 MG tablet; Take 1 tablet (20 mg total) by mouth daily. -     atorvastatin (LIPITOR) 20 MG tablet; Take 1 tablet  (20 mg total) by mouth daily. -     furosemide (LASIX) 40 MG tablet; Take 1 tablet (40 mg total) by mouth daily. -     Vitamin D, Ergocalciferol, (DRISDOL) 50000  units CAPS capsule; Take 1 capsule (50,000 Units total) by mouth every 7 (seven) days. -     fluconazole (DIFLUCAN) 100 MG tablet; 1 tablet every other day

## 2015-07-18 LAB — HEPATITIS C ANTIBODY: HCV Ab: NEGATIVE

## 2015-07-18 LAB — HEPATITIS B SURFACE ANTIBODY, QUANTITATIVE: Hepatitis B-Post: 1000 m[IU]/mL

## 2015-07-18 LAB — GC/CHLAMYDIA PROBE AMP
CT Probe RNA: NOT DETECTED
GC Probe RNA: NOT DETECTED

## 2015-07-18 LAB — HEPATITIS B SURFACE ANTIGEN: Hepatitis B Surface Ag: NEGATIVE

## 2015-07-19 ENCOUNTER — Other Ambulatory Visit: Payer: Self-pay | Admitting: Medical

## 2015-07-19 LAB — RPR

## 2015-07-19 MED ORDER — FERROUS FUM-IRON POLYSACCH-FA 162-115.2-1 MG PO CAPS: 1.0000 | ORAL_CAPSULE | Freq: Every day | ORAL | Status: DC

## 2015-08-14 ENCOUNTER — Other Ambulatory Visit: Payer: Self-pay | Admitting: Medical

## 2015-08-14 ENCOUNTER — Telehealth: Payer: Self-pay | Admitting: Medical

## 2015-08-14 MED ORDER — MICONAZOLE NITRATE 4 % VA CREA: 1.0000 "application " | TOPICAL_CREAM | Freq: Every day | VAGINAL | Status: DC

## 2015-08-14 NOTE — Telephone Encounter (Signed)
Pt called and states that she needs another round of diflucan she is still having itching in her private area, and dosen't know why you only sent her in 4 tablets, thinks she needs something stronger.or Thinks she needs antiobiotcs and states that she has a rash around her neck,  and it is itching, [facility] - [place], [place] - [address] and pt can be reached at [phone] (M)

## 2015-08-14 NOTE — Telephone Encounter (Signed)
I sent miconazole vaginal cream for itching and yeast.  If this doesn't clear things up, then we will refer to gynecology

## 2015-08-15 NOTE — Telephone Encounter (Signed)
Called and informed pt, pt is going to give us a call if she does not get any better

## 2015-09-25 ENCOUNTER — Encounter: Payer: Self-pay | Admitting: Medical

## 2015-09-25 ENCOUNTER — Ambulatory Visit (INDEPENDENT_AMBULATORY_CARE_PROVIDER_SITE_OTHER): Payer: Managed Care, Other (non HMO) | Admitting: Medical

## 2015-09-25 ENCOUNTER — Telehealth: Payer: Self-pay

## 2015-09-25 VITALS — BP 126/80 | HR 71 | Wt 258.0 lb

## 2015-09-25 DIAGNOSIS — D539 Nutritional anemia, unspecified: Secondary | ICD-10-CM | POA: Diagnosis not present

## 2015-09-25 DIAGNOSIS — E118 Type 2 diabetes mellitus with unspecified complications: Secondary | ICD-10-CM

## 2015-09-25 DIAGNOSIS — Z9119 Patient's noncompliance with other medical treatment and regimen: Secondary | ICD-10-CM

## 2015-09-25 DIAGNOSIS — I10 Essential (primary) hypertension: Secondary | ICD-10-CM | POA: Diagnosis not present

## 2015-09-25 DIAGNOSIS — E669 Obesity, unspecified: Secondary | ICD-10-CM | POA: Diagnosis not present

## 2015-09-25 DIAGNOSIS — F09 Unspecified mental disorder due to known physiological condition: Secondary | ICD-10-CM

## 2015-09-25 DIAGNOSIS — R4189 Other symptoms and signs involving cognitive functions and awareness: Secondary | ICD-10-CM

## 2015-09-25 DIAGNOSIS — E038 Other specified hypothyroidism: Secondary | ICD-10-CM | POA: Diagnosis not present

## 2015-09-25 DIAGNOSIS — Z91199 Patient's noncompliance with other medical treatment and regimen due to unspecified reason: Secondary | ICD-10-CM

## 2015-09-25 LAB — CBC WITH DIFFERENTIAL/PLATELET
Basophils Absolute: 0 cells/uL (ref 0–200)
Basophils Relative: 0 %
Eosinophils Absolute: 98 cells/uL (ref 15–500)
Eosinophils Relative: 2 %
HCT: 38.8 % (ref 35.0–45.0)
Hemoglobin: 12.1 g/dL (ref 11.7–15.5)
Lymphocytes Relative: 49 %
Lymphs Abs: 2401 cells/uL (ref 850–3900)
MCH: 24.5 pg — ABNORMAL LOW (ref 27.0–33.0)
MCHC: 31.2 g/dL — ABNORMAL LOW (ref 32.0–36.0)
MCV: 78.5 fL — ABNORMAL LOW (ref 80.0–100.0)
MPV: 9.2 fL (ref 7.5–12.5)
Monocytes Absolute: 490 cells/uL (ref 200–950)
Monocytes Relative: 10 %
Neutro Abs: 1911 cells/uL (ref 1500–7800)
Neutrophils Relative %: 39 %
Platelets: 227 10*3/uL (ref 140–400)
RBC: 4.94 MIL/uL (ref 3.80–5.10)
RDW: 17.5 % — ABNORMAL HIGH (ref 11.0–15.0)
WBC: 4.9 10*3/uL (ref 4.0–10.5)

## 2015-09-25 LAB — IRON AND TIBC
%SAT: 20 % (ref 11–50)
Iron: 67 ug/dL (ref 45–160)
TIBC: 342 ug/dL (ref 250–450)
UIBC: 275 ug/dL (ref 125–400)

## 2015-09-25 LAB — FERRITIN: Ferritin: 23 ng/mL (ref 20–288)

## 2015-09-25 MED ORDER — DULAGLUTIDE 0.75 MG/0.5ML ~~LOC~~ SOAJ: 0.5000 mL | SUBCUTANEOUS | 5 refills | Status: DC

## 2015-09-25 NOTE — Progress Notes (Addendum)
Subjective: Chief Complaint  Patient presents with  . Follow-up    on anemia, and stated that she needs medication to help lose weight. pt stated she has no money and is not sure what she is taking and what is not    Here for f/u from last visit in May.   She has hx/o hypertension, diabetes type 2, obesity, hypercholesterolemia, hypothyroidism, edema, aortic stenosis, LBBB, thyromegaly and hx/o noncompliance.  She lives alone, currently on disability, takes care of her own affairs, denies concerns about her memory, but her cognitive ability has always been in question since coming here as a patient.  She always comes by herself.     She can't tell us exactly what medications she is actually taking today.   She is mainly here for f/u on anemia seen on labs in 06/2015.  She is not sure if she took the Tandem iron or not?  She denies bleeding, bruising.   She is due for repeat colonoscopy.  Last one was 2012 with Dr. Loreta Ave, and was due f/u 2015 due to polyps.  Presumably the anemia could be related to GI source.  She denies any uterine bleeding. Still has her pelvic organs.  Not seeing gynecologist.  Last pap from 2016 reviewed, from here.  She notes not having the money to get some of her medication.  She is not sure she is taking the Invokamet, not checking sugars.   She wants help losing weight.    No other aggravating or relieving factors. No other complaint.  Past Medical History:  Diagnosis Date  . Anemia   . Aortic stenosis    Echo 04/2008:  EF 60%, mean AV gradient 7 mmHg.   . Diabetes mellitus   . Diverticula of colon    per colonoscopy6/20/11  . Edema of lower extremity    wears ted hose  . H/O echocardiogram 2014   EF 65%, slight gradient of aortic valve, no major findings otherwise  . Hx of cardiovascular stress test    Adenosine Myoview 04/2008:  Normal.  . Hyperlipidemia   . Hypertension   . Hypothyroidism   . LBBB (left bundle branch block)   . Obesity   . Thyromegaly  04/23/08   enlarged heterogeneous thyroid gland on ultrasound  . Tubulovillous adenoma   . Wears dentures    upper  . Wears glasses    ROS as in subjective       Objective: BP 126/80   Pulse 71   Wt 258 lb (117 kg)   BMI 41.96 kg/m   Wt Readings from Last 3 Encounters:  09/25/15 258 lb (117 kg)  07/17/15 257 lb (116.6 kg)  06/03/15 251 lb 6.4 oz (114 kg)   Gen: wd, wn, nad, obese AA female Psych: pleasant, but concern for level of cognition, seems to not be able to remember things like which medication she is taking, specifics about diet     Assessment: Encounter Diagnoses  Name Primary?  . Deficiency anemia Yes  . Impaired cognitive perception pattern   . Noncompliance   . Obesity   . Diabetes mellitus with complication (HCC)   . Other specified hypothyroidism   . Essential hypertension       Plan: I called pharmacy, and she picked up Tandem in May, copay $10, but didn't go back for refills.   She apparently is getting her Lipitor, Lisinopril, Levothyroxine and the copays are reasonable.   however ,she has not gotten refills on Invokamet presumably  due to costs of $30/mo copay.     Diabetes - she is not taking Invokamet and doesn't want to restart this.  She will begin trial of Trulicity weekly injection after discussing options for therapy.  C/t efforts at healthy diet, exercise.  CMA gave demo on proper use of Trulicity.  Anemia - due for repeat colonoscopy, lab today.   Referral back to Dr. Loreta Ave.   Anemia likely due to GI source, polyps.  Impaired cognition - she is resistance to referral to psychology or neurology for consult, denies any concerns about her memory or cognition despite concerns we have  Noncompliance - we reiterated need to bring in medications at visits, discussed need for other eval for anemia, compliance with diet, exercise, medications  HTN - at goal.  C/t same medications  Obesity - c/t efforts at weihgt loss through healthy diet,  exercise.  Hypothyroidism - c/t same medications  She declines home health assessment.  We will have her call back here today to double check medications  F/u pending labs   Charliee was seen today for follow-up.  Diagnoses and all orders for this visit:  Deficiency anemia -     CBC with Differential/Platelet -     Iron and TIBC -     Ferritin  Impaired cognitive perception pattern  Noncompliance  Obesity  Diabetes mellitus with complication (HCC)  Other specified hypothyroidism  Essential hypertension  Other orders -     Dulaglutide (TRULICITY) 0.75 MG/0.5ML SOPN; Inject 0.5 mLs into the skin once a week.

## 2015-09-25 NOTE — Telephone Encounter (Signed)
noted 

## 2015-09-25 NOTE — Telephone Encounter (Signed)
Pt called to let you know that she is taking atorvastatin, lisinopril, synthroid, lasix, and flonase

## 2015-09-26 ENCOUNTER — Other Ambulatory Visit: Payer: Self-pay | Admitting: Medical

## 2015-09-26 MED ORDER — FUROSEMIDE 40 MG PO TABS: 40.0000 mg | ORAL_TABLET | Freq: Every day | ORAL | 1 refills | Status: DC

## 2015-09-26 MED ORDER — FERROUS FUM-IRON POLYSACCH-FA 162-115.2-1 MG PO CAPS: 1.0000 | ORAL_CAPSULE | Freq: Every day | ORAL | 1 refills | Status: DC

## 2015-09-26 MED ORDER — LISINOPRIL 20 MG PO TABS: 20.0000 mg | ORAL_TABLET | Freq: Every day | ORAL | 1 refills | Status: DC

## 2015-09-26 MED ORDER — ATORVASTATIN CALCIUM 20 MG PO TABS: 20.0000 mg | ORAL_TABLET | Freq: Every day | ORAL | 1 refills | Status: DC

## 2015-09-26 MED ORDER — LEVOTHYROXINE SODIUM 100 MCG PO TABS: 100.0000 ug | ORAL_TABLET | Freq: Every day | ORAL | 1 refills | Status: DC

## 2015-12-23 ENCOUNTER — Telehealth: Payer: Self-pay | Admitting: Medical

## 2015-12-23 NOTE — Telephone Encounter (Signed)
Pt came in and dropped off a from from [facility] center to be completed. Sending back to be completed. Please call pt at [phone] when ready.

## 2015-12-24 ENCOUNTER — Telehealth: Payer: Self-pay | Admitting: Medical

## 2015-12-24 NOTE — Telephone Encounter (Signed)
Make her appt to discuss, and due for recheck on diabetes.  30 or 45 min appt.  Given her medications, hx/o anemia and swelling, we need to discuss pros/cons of being able to donate plasma.

## 2015-12-24 NOTE — Telephone Encounter (Signed)
error 

## 2015-12-24 NOTE — Telephone Encounter (Signed)
Pt called back and was in formed of shane's response. Pt states she does not have the money to come in right now. Pt was informed we would hold form for her until she can come in.

## 2015-12-24 NOTE — Telephone Encounter (Signed)
Call and left message for pt to call. Needs an appt per Vincenza HewsShane.

## 2016-01-03 ENCOUNTER — Encounter: Payer: Self-pay | Admitting: Medical

## 2016-01-03 ENCOUNTER — Ambulatory Visit (INDEPENDENT_AMBULATORY_CARE_PROVIDER_SITE_OTHER): Payer: Managed Care, Other (non HMO) | Admitting: Medical

## 2016-01-03 VITALS — BP 136/88 | HR 75 | Wt 251.4 lb

## 2016-01-03 DIAGNOSIS — I350 Nonrheumatic aortic (valve) stenosis: Secondary | ICD-10-CM | POA: Diagnosis not present

## 2016-01-03 DIAGNOSIS — E118 Type 2 diabetes mellitus with unspecified complications: Secondary | ICD-10-CM

## 2016-01-03 DIAGNOSIS — D539 Nutritional anemia, unspecified: Secondary | ICD-10-CM

## 2016-01-03 DIAGNOSIS — E669 Obesity, unspecified: Secondary | ICD-10-CM | POA: Diagnosis not present

## 2016-01-03 DIAGNOSIS — Z9119 Patient's noncompliance with other medical treatment and regimen: Secondary | ICD-10-CM

## 2016-01-03 DIAGNOSIS — R4189 Other symptoms and signs involving cognitive functions and awareness: Secondary | ICD-10-CM

## 2016-01-03 DIAGNOSIS — E038 Other specified hypothyroidism: Secondary | ICD-10-CM

## 2016-01-03 DIAGNOSIS — R609 Edema, unspecified: Secondary | ICD-10-CM | POA: Diagnosis not present

## 2016-01-03 DIAGNOSIS — I10 Essential (primary) hypertension: Secondary | ICD-10-CM

## 2016-01-03 DIAGNOSIS — Z91199 Patient's noncompliance with other medical treatment and regimen due to unspecified reason: Secondary | ICD-10-CM

## 2016-01-03 DIAGNOSIS — E7800 Pure hypercholesterolemia, unspecified: Secondary | ICD-10-CM | POA: Diagnosis not present

## 2016-01-03 DIAGNOSIS — F09 Unspecified mental disorder due to known physiological condition: Secondary | ICD-10-CM | POA: Diagnosis not present

## 2016-01-03 LAB — COMPREHENSIVE METABOLIC PANEL
ALT: 17 U/L (ref 6–29)
AST: 17 U/L (ref 10–35)
Albumin: 3.7 g/dL (ref 3.6–5.1)
Alkaline Phosphatase: 91 U/L (ref 33–130)
BUN: 13 mg/dL (ref 7–25)
CO2: 24 mmol/L (ref 20–31)
Calcium: 10.2 mg/dL (ref 8.6–10.4)
Chloride: 109 mmol/L (ref 98–110)
Creat: 0.96 mg/dL (ref 0.50–0.99)
Glucose, Bld: 95 mg/dL (ref 65–99)
Potassium: 4.7 mmol/L (ref 3.5–5.3)
Sodium: 143 mmol/L (ref 135–146)
Total Bilirubin: 0.5 mg/dL (ref 0.2–1.2)
Total Protein: 7 g/dL (ref 6.1–8.1)

## 2016-01-03 NOTE — Progress Notes (Signed)
Subjective: Chief Complaint  Patient presents with  . conslt 30    form that needs to fill out   Here for concerns.  She wants to donate plasma at [facility], and recently [facility] sent a form for release of records. They apparently plan to draw 880ml plasma twice weekly from her.   They want her to be ruled out for cardiac disorder and make sure BP is under control.  She notes that [facility] recently noted her BP to be elevated, and she notes that she had been out of medication.   couldn't afford to pay for all of her medication at that time.  She notes being compliant with medication today.  [facility] [address]  Has hyperlipidemia - takes Lipitor 20mg  daily  Diabetes - taking trulicity injection weekly.    occasionally checks sugars at home.     Taking tandem for iron therapy  Takes zyrtec and Flonase daily for allergies  Takes lasix 40mg  maybe once weekly for edema  Takes synthroid 100mcg daily for hypothyroidism  Hypertension - takes Lisinopril 20mg  daily.  Not checking BPs.   Takes vit D prescription weekly  Denies sob, CP.   Past Medical History:  Diagnosis Date  . Anemia   . Aortic stenosis    Echo 04/2008:  EF 60%, mean AV gradient 7 mmHg.   . Diabetes mellitus   . Diverticula of colon    per colonoscopy6/20/11  . Edema of lower extremity    wears ted hose  . H/O echocardiogram 2014   EF 65%, slight gradient of aortic valve, no major findings otherwise  . Hx of cardiovascular stress test    Adenosine Myoview 04/2008:  Normal.  . Hyperlipidemia   . Hypertension   . Hypothyroidism   . LBBB (left bundle branch block)   . Obesity   . 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 Tubulovillous adenoma   . Wears dentures    upper  . Wears glasses    Current Outpatient Prescriptions on File Prior to Visit  Medication Sig Dispense Refill  . atorvastatin (LIPITOR) 20 MG tablet Take 1 tablet (20 mg total) by  mouth daily. 90 tablet 1  . Dulaglutide (TRULICITY) 0.75 MG/0.5ML SOPN Inject 0.5 mLs into the skin once a week. 0.5 mL 5  . Ferrous Fum-Iron Polysacch-FA (TANDEM F) 162-115.2-1 MG CAPS Take 1 tablet by mouth daily. 90 each 1  . furosemide (LASIX) 40 MG tablet Take 1 tablet (40 mg total) by mouth daily. 90 tablet 1  . levothyroxine (SYNTHROID, LEVOTHROID) 100 MCG tablet Take 1 tablet (100 mcg total) by mouth daily. 90 tablet 1  . lisinopril (PRINIVIL,ZESTRIL) 20 MG tablet Take 1 tablet (20 mg total) by mouth daily. 90 tablet 1  . cetirizine (ZYRTEC) 10 MG tablet Take 1 tablet (10 mg total) by mouth at bedtime. (Patient not taking: Reported on 01/03/2016) 90 tablet 3  . fluticasone (FLONASE) 50 MCG/ACT nasal spray Place 2 sprays into both nostrils daily. (Patient not taking: Reported on 01/03/2016) 16 g 11  . triamcinolone cream (KENALOG) 0.1 % Apply 1 application topically 2 (two) times daily. (Patient not taking: Reported on 01/03/2016) 30 g 0  . Vitamin D, Ergocalciferol, (DRISDOL) 50000 units CAPS capsule Take 1 capsule (50,000 Units total) by mouth every 7 (seven) days. (Patient not taking: Reported on 01/03/2016) 11 capsule 3   No current facility-administered medications on file prior to visit.  ROS as in subjective   Objective: BP 136/88   Pulse 75   Wt 251 lb 6.4 oz (114 kg)   SpO2 98%   BMI 40.89 kg/m   BP Readings from Last 3 Encounters:  01/03/16 136/88  09/25/15 126/80  07/17/15 136/90   Wt Readings from Last 3 Encounters:  01/03/16 251 lb 6.4 oz (114 kg)  09/25/15 258 lb (117 kg)  07/17/15 257 lb (116.6 kg)   Gen: wd, wn, nad Skin unremarkable Lungs clear Heart: 3/6 systolic murmur heart best in right upper sternal border, otherwise no murmur, normal s1, s2 Pulses 1+ Abdomen: nontender, no mass, no organomegaly Ext: 1+ pitting edema LE bilat   Transthoracic Echo 11/30/12 with Dr. Ivor MessierBrain Crenshaw Study Conclusions  - Left ventricle: The cavity size was  normal. Wall thickness was normal. The estimated ejection fraction was 65%. Wall motion was normal; there were no regional wall motion abnormalities. - Aortic valve: The aortic leaflets open well. There is only a slight gradient. Mean gradient: 13mm Hg (S). Peak gradient: 20mm Hg (S). - Pulmonary arteries: PA peak pressure: 36mm Hg (S).   Assessment: Encounter Diagnoses  Name Primary?  . Essential hypertension Yes  . Aortic valve stenosis, etiology of cardiac valve disease unspecified   . Diabetes mellitus with complication (HCC)   . Other specified hypothyroidism   . Pure hypercholesterolemia   . Obesity without serious comorbidity, unspecified classification, unspecified obesity type   . Deficiency anemia   . Edema, unspecified type   . Noncompliance   . Impaired cognitive perception pattern      Plan: Reviewed last echocardiogram and noted results above, last cardiology consult notes with Tereso NewcomerScott Weaver PA-C from 11/15/12, and at that time she was found to have mild aortic stenosis, asymptomatic.    Referral back to cardiology regarding Aortic stenosis  Pending labs, will likely lower dose of lasix and use this or other daily diuretic  C/t same medications for now  Her total med cost per month is around $42 for all meds.  We called pharmacy to verify.  Consider changing trulicity her most expensive medication  discussed her desire to do plasma donations for some cash.  My only concern is her fluid state being on diuretic given that they take almost 2 L / week.  She has not been in heart failure up until now, and no symptoms of CHF currently   Koby was seen today for conslt 30.  Diagnoses and all orders for this visit:  Essential hypertension -     Comprehensive metabolic panel -     Ambulatory referral to Cardiology  Aortic valve stenosis, etiology of cardiac valve disease unspecified -     Ambulatory referral to Cardiology  Diabetes mellitus with  complication (HCC) -     Comprehensive metabolic panel -     Hemoglobin A1c  Other specified hypothyroidism  Pure hypercholesterolemia  Obesity without serious comorbidity, unspecified classification, unspecified obesity type  Deficiency anemia  Edema, unspecified type -     Ambulatory referral to Cardiology  Noncompliance  Impaired cognitive perception pattern

## 2016-01-04 LAB — HEMOGLOBIN A1C
Hgb A1c MFr Bld: 5.7 % — ABNORMAL HIGH (ref <5.7)
Mean Plasma Glucose: 117 mg/dL

## 2016-01-06 ENCOUNTER — Other Ambulatory Visit: Payer: Self-pay | Admitting: Medical

## 2016-01-06 MED ORDER — LISINOPRIL-HYDROCHLOROTHIAZIDE 20-12.5 MG PO TABS: 1.0000 | ORAL_TABLET | Freq: Every day | ORAL | 0 refills | Status: DC

## 2016-01-20 ENCOUNTER — Ambulatory Visit (INDEPENDENT_AMBULATORY_CARE_PROVIDER_SITE_OTHER): Payer: Managed Care, Other (non HMO) | Admitting: Cardiology

## 2016-01-20 ENCOUNTER — Encounter: Payer: Self-pay | Admitting: Cardiology

## 2016-01-20 VITALS — BP 128/80 | HR 66 | Ht 66.0 in | Wt 246.8 lb

## 2016-01-20 DIAGNOSIS — I10 Essential (primary) hypertension: Secondary | ICD-10-CM | POA: Diagnosis not present

## 2016-01-20 DIAGNOSIS — E785 Hyperlipidemia, unspecified: Secondary | ICD-10-CM | POA: Diagnosis not present

## 2016-01-20 DIAGNOSIS — I350 Nonrheumatic aortic (valve) stenosis: Secondary | ICD-10-CM | POA: Diagnosis not present

## 2016-01-20 DIAGNOSIS — R600 Localized edema: Secondary | ICD-10-CM

## 2016-01-20 DIAGNOSIS — I447 Left bundle-branch block, unspecified: Secondary | ICD-10-CM

## 2016-01-20 NOTE — Patient Instructions (Signed)
NO CHANGES TO CURRENT MEDICATIONS   SCHEDULE AT [address] Your physician has requested that you have an echocardiogram. Echocardiography is a painless test that uses sound waves to create images of your heart. It provides your doctor with information about the size and shape of your heart and how well your heart's chambers and valves are working. This procedure takes approximately one hour. There are no restrictions for this procedure.   Your physician wants you to follow-up in:6 MONTHS WITH DR HARDINGYou will receive a reminder letter in the mail two months in advance. If you don't receive a letter, please call our office to schedule the follow-up appointment.   If you need a refill on your cardiac medications before your next appointment, please call your pharmacy.

## 2016-01-20 NOTE — Progress Notes (Signed)
PCP: Ernst BreachYSINGER, Kathryn Sinyard SHANE, PA-C  Clinic Note: Chief Complaint  Patient presents with  . New Patient (Initial Visit)    pt states no chest pain no SOB   . Edema    some in both legs and ankles     HPI: Kathryn [name] is a [age] y.o. female with a PMH below who presents today for re-establishing care for Mild AS, LBBB.  Kathryn [name] was last seen on 10/2012 by Tereso NewcomerScott Weaver, PA for mild AS & LBBB.   Recent Hospitalizations: none  Studies Reviewed:   Echo 11/2012: EF ~65%. Mild AS Mean gradient: 13mm Hg (S). Peak gradient: 20mm Hg (S).  Interval History:  Kathryn [name] presents today to reestablish follow-up care. She has a significant family history of CAD history she herself carries a diagnosis of mild aortic stenosis from 2014. The main thing she notices that she's a little bit better shape and gets short of breath if she exerts herself significant. Currently she is recovering from a cold but really for the most part has no complaints. She is stable for cardiology standpoint with review of symptoms as follows:  No chest pain or shortness of breath with rest or exertion.  No PND, orthopnea with mild edema.  No palpitations, lightheadedness, dizziness, weakness or syncope/near syncope. No TIA/amaurosis fugax symptoms. No melena, hematochezia, hematuria, or epstaxis. No claudication.   ROS: A comprehensive was performed. Review of Systems  Constitutional: Negative for chills, fever, malaise/fatigue and weight loss.  HENT: Positive for congestion and sinus pain. Negative for nosebleeds.   Eyes: Negative.   Respiratory: Positive for cough (Recovering from a cold). Negative for shortness of breath and wheezing.   Cardiovascular:       Per history of present illness  Gastrointestinal: Negative.  Negative for blood in stool, constipation, heartburn, nausea and vomiting.  Genitourinary: Negative.  Negative for hematuria.  Musculoskeletal: Positive for joint pain.  Negative for back pain and myalgias.  Skin: Negative.   Neurological: Negative for dizziness, loss of consciousness and weakness.  Endo/Heme/Allergies: Positive for environmental allergies.  Psychiatric/Behavioral: Negative for depression and memory loss. The patient is not nervous/anxious and does not have insomnia.     Past Medical History:  Diagnosis Date  . Anemia   . Aortic stenosis    Echo 04/2008:  EF 60%, mean AV gradient 7 mmHg.   . Diabetes mellitus   . Diverticula of colon    per colonoscopy6/20/11  . Edema of lower extremity    wears ted hose  . H/O echocardiogram 2014   EF 65%, slight gradient of aortic valve, no major findings otherwise  . Hx of cardiovascular stress test    Adenosine Myoview 04/2008:  Normal.  . Hyperlipidemia   . Hypertension   . Hypothyroidism   . LBBB (left bundle branch block)   . Obesity   . 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 Tubulovillous adenoma   . Wears dentures    upper  . Wears glasses     Past Surgical History:  Procedure Laterality Date  . CARPAL TUNNEL RELEASE     bilat  . COLECTOMY  08/13/09   laparoscopic converted to open transverse colectomy w/ side to side anastomosis  . COLONOSCOPY  09/01/10   Dr. Charna ElizabethJyothi Mann; polyp, tubular adenoma; repeat 3 years  . LASIK    . TRANSTHORACIC ECHOCARDIOGRAM  11/2012   EF~65%. Mild ES. Mean gradient 13 mmHg. Peak 20 mmHg  .  VEIN SURGERY  2010   right leg   Current Meds  Medication Sig  . atorvastatin (LIPITOR) 20 MG tablet Take 1 tablet (20 mg total) by mouth daily.  . cetirizine (ZYRTEC) 10 MG tablet Take 1 tablet (10 mg total) by mouth at bedtime.  . Dulaglutide (TRULICITY) 0.75 MG/0.5ML SOPN Inject 0.5 mLs into the skin once a week.  . Ferrous Fum-Iron Polysacch-FA (TANDEM F) 162-115.2-1 MG CAPS Take 1 tablet by mouth daily.  . fluticasone (FLONASE) 50 MCG/ACT nasal spray Place 2 sprays into both nostrils daily.  Marland Kitchen levothyroxine (SYNTHROID,  LEVOTHROID) 100 MCG tablet Take 1 tablet (100 mcg total) by mouth daily.  Marland Kitchen lisinopril-hydrochlorothiazide (PRINZIDE,ZESTORETIC) 20-12.5 MG tablet Take 1 tablet by mouth daily.  . Vitamin D, Ergocalciferol, (DRISDOL) 50000 units CAPS capsule Take 1 capsule (50,000 Units total) by mouth every 7 (seven) days.   Allergies  Allergen Reactions  . Codeine     Social History   Social History  . Marital status: Divorced    Spouse name: N/A  . Number of children: N/A  . Years of education: N/A   Social History Main Topics  . Smoking status: Never Smoker  . Smokeless tobacco: Never Used  . Alcohol use No  . Drug use: No  . Sexual activity: Not Asked     Comment: married, 5 children, works at [employer], exrecise with walking   Other Topics Concern  . None   Social History Narrative   Single, works part time at [employer], exercises with walking, lives alon    Family History  Problem Relation Age of Onset  . Cancer Father     stomach cancer  . Diabetes Sister   . CAD Sister     s/p PCI  . CAD Mother     s/p PCI  . CAD Brother     s/p PCI    Wt Readings from Last 3 Encounters:  01/20/16 111.9 kg (246 lb 12.8 oz)  01/03/16 114 kg (251 lb 6.4 oz)  09/25/15 117 kg (258 lb)    PHYSICAL EXAM BP 128/80   Pulse 66   Ht 5\' 6"  (1.676 m)   Wt 111.9 kg (246 lb 12.8 oz)   BMI 39.83 kg/m   General appearance: alert, cooperative, appears stated age, no distress and moderate to severely obese Neck: no adenopathy, no carotid bruit and no JVD Lungs: clear to auscultation bilaterally, normal percussion bilaterally and non-labored Heart: RRR, S1 & S2 normal.  No R/G,  2/6 SEM c-d @ RUSB -> carotids.  Non-displaced PMI.  Abdomen: soft, non-tender; bowel sounds normal; no masses,  no organomegaly; no HJR Extremities: extremities normal, atraumatic, no cyanosis, and trace edema  Pulses: 2+ and symmetric;  Skin: normal and no lesions noted Neurologic: Mental status: Alert, oriented,  thought content appropriate Cranial nerves: normal (II-XII grossly intact)    Adult ECG Report  Rate: 66 ;  Rhythm: normal sinus rhythm and LBBB. ; ? RAA; otherwise normal axis, intervals and durations  Narrative Interpretation: Stable EKG   Other studies Reviewed: Additional studies/ records that were reviewed today include:  Recent Labs:   Lab Results  Component Value Date   CHOL 131 07/17/2015   HDL 46 07/17/2015   LDLCALC 65 07/17/2015   TRIG 101 07/17/2015   CHOLHDL 2.8 07/17/2015   Lab Results  Component Value Date   CREATININE 0.96 01/03/2016      ASSESSMENT / PLAN: Problem List Items Addressed This Visit  Hyperlipidemia with target low density lipoprotein (LDL) cholesterol less than 100 mg/dL (Chronic)    On moderate dose atorvastatin. Labs look well controlled as of May.  Continue current dose of statin. Follow-up with PCP.      Bilateral lower extremity edema (Chronic)    Pretty well-controlled with the HCTZ part of her antihypertensive. Improves with elevation. No real signs of heart failure. I don't suspect this is cardiac in nature.      Essential hypertension (Chronic)    Well-controlled on combination lisinopril- HCTZ      Relevant Orders   EKG 12-Lead (Completed)   ECHOCARDIOGRAM COMPLETE   LBBB (left bundle branch block) (Chronic)    Chronic. However need to assess for any signs of LBBB related cardiopathy.  Plan: 2-D echo      Relevant Orders   EKG 12-Lead (Completed)   ECHOCARDIOGRAM COMPLETE   Mild aortic stenosis by prior echocardiogram - Primary (Chronic)    She has not had an echo since 2014. The murmur doesn't sound that much more significantly, however is not unreasonable to assess at least every 2-3 years as long as today's mild. No symptoms to suggest symptomatically AS.  Plan: Recheck 2-D echocardiogram.      Relevant Orders   EKG 12-Lead (Completed)   ECHOCARDIOGRAM COMPLETE      Current medicines are reviewed at  length with the patient today. (+/- concerns) none  The following changes have been made: none  Patient Instructions  NO CHANGES TO CURRENT MEDICATIONS   SCHEDULE AT [address] Your physician has requested that you have an echocardiogram. Echocardiography is a painless test that uses sound waves to create images of your heart. It provides your doctor with information about the size and shape of your heart and how well your heart's chambers and valves are working. This procedure takes approximately one hour. There are no restrictions for this procedure.   Your physician wants you to follow-up in:6 MONTHS WITH DR HARDINGYou will receive a reminder letter in the mail two months in advance. If you don't receive a letter, please call our office to schedule the follow-up appointment.   If you need a refill on your cardiac medications before your next appointment, please call your pharmacy.    Studies Ordered:   Orders Placed This Encounter  Procedures  . EKG 12-Lead  . ECHOCARDIOGRAM COMPLETE      Bryan Lemmaavid Linkon Siverson, M.D., M.S. Interventional Cardiologist   Pager # [phone] Phone # [phone] [address] [place], [place] [address]

## 2016-01-21 NOTE — Telephone Encounter (Signed)
Patient called and said that we had a form that she gave us on her office visits , do you have this form on you desk ??

## 2016-01-22 ENCOUNTER — Encounter: Payer: Self-pay | Admitting: Cardiology

## 2016-01-22 NOTE — Assessment & Plan Note (Signed)
Well-controlled on combination lisinopril- HCTZ

## 2016-01-22 NOTE — Assessment & Plan Note (Signed)
On moderate dose atorvastatin. Labs look well controlled as of May.  Continue current dose of statin. Follow-up with PCP.

## 2016-01-22 NOTE — Assessment & Plan Note (Signed)
She has not had an echo since 2014. The murmur doesn't sound that much more significantly, however is not unreasonable to assess at least every 2-3 years as long as today's mild. No symptoms to suggest symptomatically AS.  Plan: Recheck 2-D echocardiogram.

## 2016-01-22 NOTE — Assessment & Plan Note (Signed)
Pretty well-controlled with the HCTZ part of her antihypertensive. Improves with elevation. No real signs of heart failure. I don't suspect this is cardiac in nature.

## 2016-01-22 NOTE — Assessment & Plan Note (Signed)
Chronic. However need to assess for any signs of LBBB related cardiopathy.  Plan: 2-D echo

## 2016-01-22 NOTE — Telephone Encounter (Signed)
I had sent it back up in the green folder to hold in her paper chart until we get feedback from cardiology.  I told her she would need to see cardiology before we could make a decision.  The issue is she wants to do blood plasma donations.  They remove quite a bit of fluid, and with her being on diuretic and with her valve issue in the heart, not so sure this is a good idea.   She needs f/u with cardiology anyhow, so make sure she has appt, or if not, make sure we refer.  I think if cardiology is ok with it, she would have to be either on lower steady/daily dose of diuretic or no diuretic at all if she plans to do the plasma donation twice weekly.   I don't want her having a fluid shift in her body causing a problem with the heart or kidney.  Make copy of the clearance form in paper chart to send to her cardiologist.

## 2016-01-27 ENCOUNTER — Telehealth: Payer: Self-pay | Admitting: Medical

## 2016-01-27 NOTE — Telephone Encounter (Signed)
Please call re: status of form for plasma center

## 2016-01-28 NOTE — Telephone Encounter (Signed)
I signed off on her form with instructions given to her in the office today by CMA Dorinda Hillarsha that she must keep her hydration consistent, weight consistent given almost 2 liters of plasma removed weekly and she is on diuretic.  She voices understanding.

## 2016-02-14 ENCOUNTER — Other Ambulatory Visit (HOSPITAL_COMMUNITY): Payer: Managed Care, Other (non HMO)

## 2016-03-16 ENCOUNTER — Other Ambulatory Visit (INDEPENDENT_AMBULATORY_CARE_PROVIDER_SITE_OTHER): Payer: Medicaid Other

## 2016-03-16 DIAGNOSIS — Z23 Encounter for immunization: Secondary | ICD-10-CM

## 2016-03-19 ENCOUNTER — Telehealth: Payer: Self-pay | Admitting: Medical

## 2016-03-19 NOTE — Telephone Encounter (Signed)
Called Medicaid t# [phone] initiated P.A. Trulicity review # [id number] can call back in 24 hours for response.  Preferred medications are Tanzeum, Byetta, Bydureon.

## 2016-04-22 ENCOUNTER — Other Ambulatory Visit: Payer: Self-pay | Admitting: Medical

## 2016-04-22 ENCOUNTER — Telehealth: Payer: Self-pay | Admitting: Medical

## 2016-04-22 NOTE — Telephone Encounter (Signed)
See previous telephone call that was already closed.  Medicaid does not respond to their P.A's so I Called [employer] & Trulicity went thru for $3 & pt already picked up

## 2016-05-04 ENCOUNTER — Ambulatory Visit (INDEPENDENT_AMBULATORY_CARE_PROVIDER_SITE_OTHER): Payer: Medicaid Other | Admitting: Medical

## 2016-05-04 VITALS — BP 122/78 | HR 80 | Temp 98.0°F | Resp 16 | Ht 66.0 in | Wt 250.6 lb

## 2016-05-04 DIAGNOSIS — E118 Type 2 diabetes mellitus with unspecified complications: Secondary | ICD-10-CM | POA: Diagnosis not present

## 2016-05-04 DIAGNOSIS — E7800 Pure hypercholesterolemia, unspecified: Secondary | ICD-10-CM | POA: Diagnosis not present

## 2016-05-04 DIAGNOSIS — D539 Nutritional anemia, unspecified: Secondary | ICD-10-CM | POA: Diagnosis not present

## 2016-05-04 DIAGNOSIS — I10 Essential (primary) hypertension: Secondary | ICD-10-CM | POA: Diagnosis not present

## 2016-05-04 DIAGNOSIS — E039 Hypothyroidism, unspecified: Secondary | ICD-10-CM

## 2016-05-04 LAB — COMPREHENSIVE METABOLIC PANEL
ALT: 18 U/L (ref 6–29)
AST: 17 U/L (ref 10–35)
Albumin: 4.1 g/dL (ref 3.6–5.1)
Alkaline Phosphatase: 87 U/L (ref 33–130)
BUN: 18 mg/dL (ref 7–25)
CO2: 29 mmol/L (ref 20–31)
Calcium: 10 mg/dL (ref 8.6–10.4)
Chloride: 104 mmol/L (ref 98–110)
Creat: 0.93 mg/dL (ref 0.50–0.99)
Glucose, Bld: 99 mg/dL (ref 65–99)
Potassium: 4.7 mmol/L (ref 3.5–5.3)
Sodium: 140 mmol/L (ref 135–146)
Total Bilirubin: 0.5 mg/dL (ref 0.2–1.2)
Total Protein: 7.7 g/dL (ref 6.1–8.1)

## 2016-05-04 LAB — TSH: TSH: 0.05 mIU/L — ABNORMAL LOW

## 2016-05-04 LAB — T4, FREE: Free T4: 1.6 ng/dL (ref 0.8–1.8)

## 2016-05-04 MED ORDER — LISINOPRIL-HYDROCHLOROTHIAZIDE 20-12.5 MG PO TABS: 1.0000 | ORAL_TABLET | Freq: Every day | ORAL | 3 refills | Status: DC

## 2016-05-04 MED ORDER — CETIRIZINE HCL 10 MG PO TABS: 10.0000 mg | ORAL_TABLET | Freq: Every day | ORAL | 3 refills | Status: DC

## 2016-05-04 MED ORDER — LEVOTHYROXINE SODIUM 100 MCG PO TABS: 100.0000 ug | ORAL_TABLET | Freq: Every day | ORAL | 3 refills | Status: DC

## 2016-05-04 MED ORDER — AMOXICILLIN 500 MG PO TABS: ORAL_TABLET | ORAL | 0 refills | Status: DC

## 2016-05-04 MED ORDER — FLUTICASONE PROPIONATE 50 MCG/ACT NA SUSP: 2.0000 | Freq: Every day | NASAL | 11 refills | Status: DC

## 2016-05-04 MED ORDER — DULAGLUTIDE 0.75 MG/0.5ML ~~LOC~~ SOAJ: 0.5000 mL | SUBCUTANEOUS | 5 refills | Status: DC

## 2016-05-04 MED ORDER — ATORVASTATIN CALCIUM 20 MG PO TABS: 20.0000 mg | ORAL_TABLET | Freq: Every day | ORAL | 1 refills | Status: DC

## 2016-05-04 NOTE — Progress Notes (Signed)
Subjective: Chief Complaint  Patient presents with  . Diabetes    nonfasting med check. Wants to know if you can give her pain medicine for her aches and pains in her legs.   . Cough    7-10 days. Mucus is discolored. No fevers.    Here for med check, f/u.   Diabetes - taking trulicity injection weekly.    occasionally checks sugars at home.     Needs refill on Zyrtec and Flonase daily for allergies  Takes synthroid daily for hypothyroidism  Hypertension - taking Lisinopril 20/12.5mg  daily.  Not checking BPs.   Has hyperlipidemia - takes Lipitor 20mg  daily  Denies sob, CP.   Has sinus pressure, headache, colored mucous, sore throat, x 1 wk.  Requesting antibiotic.    Gets intermittent knee pain, no swelling, no limping, no recent injury, trauma.  Wants something to help with pain.  Past Medical History:  Diagnosis Date  . Anemia   . Aortic stenosis    Echo 04/2008:  EF 60%, mean AV gradient 7 mmHg.   . Diabetes mellitus   . Diverticula of colon    per colonoscopy6/20/11  . Edema of lower extremity    wears ted hose  . H/O echocardiogram 2014   EF 65%, slight gradient of aortic valve, no major findings otherwise  . Hx of cardiovascular stress test    Adenosine Myoview 04/2008:  Normal.  . Hyperlipidemia   . Hypertension   . Hypothyroidism   . LBBB (left bundle branch block)   . Obesity   . 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 Tubulovillous adenoma   . Wears dentures    upper  . Wears glasses    No current outpatient prescriptions on file prior to visit.   No current facility-administered medications on file prior to visit.    ROS as in subjective   Objective: BP 122/78 (BP Location: Left Arm, Patient Position: Sitting, Cuff Size: Normal)   Pulse 80   Temp 98 F (36.7 C) (Oral)   Resp 16   Ht 5\' 6"  (1.676 m)   Wt 250 lb 9.6 oz (113.7 kg)   BMI 40.45 kg/m   BP Readings from Last 3 Encounters:  05/04/16  122/78  01/20/16 128/80  01/03/16 136/88   Wt Readings from Last 3 Encounters:  05/04/16 250 lb 9.6 oz (113.7 kg)  01/20/16 246 lb 12.8 oz (111.9 kg)  01/03/16 251 lb 6.4 oz (114 kg)   Gen: wd, wn, nad Skin unremarkable Lungs clear Heart: 3/6 systolic murmur heart best in right upper sternal border, otherwise no murmur, normal s1, s2 Pulses 1+ Abdomen: nontender, no mass, no organomegaly Ext: no edema LE bilat, some spider veins noted behind knees, mild varicosities. bilat knees with mild bony arthritis changes, particular on the left.  No laxity, no limp, normal ROM, otherwise legs nontender.   Transthoracic Echo 11/30/12 with Dr. Ivor Messier Study Conclusions  - Left ventricle: The cavity size was normal. Wall thickness was normal. The estimated ejection fraction was 65%. Wall motion was normal; there were no regional wall motion abnormalities. - Aortic valve: The aortic leaflets open well. There is only a slight gradient. Mean gradient: 13mm Hg (S). Peak gradient: 20mm Hg (S). - Pulmonary arteries: PA peak pressure: 36mm Hg (S).   Assessment: Encounter Diagnoses  Name Primary?  . Essential hypertension Yes  . Diabetes mellitus with complication (HCC)   . Deficiency anemia   . Pure  hypercholesterolemia   . Hypothyroidism, unspecified type      Plan: Begin amoxicillin for sinusitis.  Rest, hydrate well. Labs today C/t same medications BP at goal Refilled allergy medication  Brittinie was seen today for diabetes and cough.  Diagnoses and all orders for this visit:  Essential hypertension -     Comprehensive metabolic panel  Diabetes mellitus with complication (HCC) -     Hemoglobin A1c  Deficiency anemia  Pure hypercholesterolemia  Hypothyroidism, unspecified type -     TSH -     T4, free  Other orders -     amoxicillin (AMOXIL) 500 MG tablet; 2 tablets po BID x 10 days -     lisinopril-hydrochlorothiazide (PRINZIDE,ZESTORETIC) 20-12.5  MG tablet; Take 1 tablet by mouth daily. -     Dulaglutide (TRULICITY) 0.75 MG/0.5ML SOPN; Inject 0.5 mLs into the skin once a week. -     cetirizine (ZYRTEC) 10 MG tablet; Take 1 tablet (10 mg total) by mouth at bedtime. -     fluticasone (FLONASE) 50 MCG/ACT nasal spray; Place 2 sprays into both nostrils daily. -     levothyroxine (SYNTHROID, LEVOTHROID) 100 MCG tablet; Take 1 tablet (100 mcg total) by mouth daily. -     atorvastatin (LIPITOR) 20 MG tablet; Take 1 tablet (20 mg total) by mouth daily.

## 2016-05-05 ENCOUNTER — Other Ambulatory Visit: Payer: Self-pay | Admitting: Medical

## 2016-05-05 LAB — HEMOGLOBIN A1C
Hgb A1c MFr Bld: 6.1 % — ABNORMAL HIGH (ref <5.7)
Mean Plasma Glucose: 128 mg/dL

## 2016-05-05 MED ORDER — LEVOTHYROXINE SODIUM 75 MCG PO TABS: 75.0000 ug | ORAL_TABLET | Freq: Every day | ORAL | 0 refills | Status: DC

## 2016-06-10 ENCOUNTER — Telehealth: Payer: Self-pay

## 2016-06-10 NOTE — Telephone Encounter (Signed)
I guess we will have to.  Do I need to send script

## 2016-06-10 NOTE — Telephone Encounter (Signed)
Pharmacy faxed to let us know that they have switched brand of levothyroxine from [employer] to [employer]. They state this is the only kind they carry now. Ok to switch? Trixie Rude

## 2016-06-10 NOTE — Telephone Encounter (Signed)
I will contact pharmacy to inform them. Trixie Rude

## 2016-06-10 NOTE — Telephone Encounter (Signed)
Pharmacy notified. Trixie Rude

## 2016-06-11 ENCOUNTER — Telehealth: Payer: Self-pay | Admitting: Medical

## 2016-06-11 NOTE — Telephone Encounter (Signed)
[facility] called to let Kathryn [name] know that the generic brand in changing on Levothyroxine. They will proceed with refilling this med as normal unless they hear otherwise from our office.

## 2016-06-11 NOTE — Telephone Encounter (Signed)
That is fine as I guess we don't have a choice

## 2016-07-09 ENCOUNTER — Ambulatory Visit (HOSPITAL_COMMUNITY): Payer: Medicaid Other | Attending: Cardiovascular Disease

## 2016-07-09 ENCOUNTER — Other Ambulatory Visit: Payer: Self-pay

## 2016-07-09 DIAGNOSIS — I350 Nonrheumatic aortic (valve) stenosis: Secondary | ICD-10-CM

## 2016-07-09 DIAGNOSIS — I447 Left bundle-branch block, unspecified: Secondary | ICD-10-CM | POA: Diagnosis not present

## 2016-07-09 DIAGNOSIS — I10 Essential (primary) hypertension: Secondary | ICD-10-CM

## 2016-07-24 ENCOUNTER — Other Ambulatory Visit: Payer: Self-pay

## 2016-07-24 ENCOUNTER — Ambulatory Visit (INDEPENDENT_AMBULATORY_CARE_PROVIDER_SITE_OTHER): Payer: Medicaid Other | Admitting: Medical

## 2016-07-24 ENCOUNTER — Encounter: Payer: Self-pay | Admitting: Medical

## 2016-07-24 VITALS — BP 134/74 | HR 69 | Temp 98.5°F | Wt 247.2 lb

## 2016-07-24 DIAGNOSIS — R05 Cough: Secondary | ICD-10-CM | POA: Diagnosis not present

## 2016-07-24 DIAGNOSIS — E039 Hypothyroidism, unspecified: Secondary | ICD-10-CM | POA: Diagnosis not present

## 2016-07-24 DIAGNOSIS — R609 Edema, unspecified: Secondary | ICD-10-CM | POA: Diagnosis not present

## 2016-07-24 DIAGNOSIS — R0982 Postnasal drip: Secondary | ICD-10-CM | POA: Diagnosis not present

## 2016-07-24 DIAGNOSIS — Z9119 Patient's noncompliance with other medical treatment and regimen: Secondary | ICD-10-CM

## 2016-07-24 DIAGNOSIS — E118 Type 2 diabetes mellitus with unspecified complications: Secondary | ICD-10-CM

## 2016-07-24 DIAGNOSIS — R059 Cough, unspecified: Secondary | ICD-10-CM

## 2016-07-24 DIAGNOSIS — Z91199 Patient's noncompliance with other medical treatment and regimen due to unspecified reason: Secondary | ICD-10-CM

## 2016-07-24 DIAGNOSIS — I10 Essential (primary) hypertension: Secondary | ICD-10-CM

## 2016-07-24 MED ORDER — DULAGLUTIDE 0.75 MG/0.5ML ~~LOC~~ SOAJ: 0.5000 mL | SUBCUTANEOUS | 5 refills | Status: DC

## 2016-07-24 MED ORDER — AZITHROMYCIN 500 MG PO TABS: ORAL_TABLET | ORAL | 0 refills | Status: DC

## 2016-07-24 MED ORDER — CETIRIZINE HCL 10 MG PO TABS: 10.0000 mg | ORAL_TABLET | Freq: Every day | ORAL | 3 refills | Status: DC

## 2016-07-24 NOTE — Progress Notes (Signed)
Subjective:  Kathryn [name] is a 66 y.o. female who presents for cough x 1 week.  Has some sore throat, cough, some runny nose, slight right ear discomfort.   No fever.   Had some nausea.  No vomiting, no wheezing, no SOB.   Nonsmoker.  Using Excedrin.  No sick contacts.  Does have sneezing, runny nose, itchy water eyes.   taking Flonase but no allergy pill.  Thinks she needs antibiotic.  Wonders if she can get a stronger fluid pill.  Doesn't think her's works as good as it could. Does add sea salt to food, eats fast foods.  Ran out of her diabetes injection and thyroid medication, been out of thyroid medication at least a week.  No other aggravating or relieving factors.  No other c/o.  Past Medical History:  Diagnosis Date  . Anemia   . Aortic stenosis    Echo 04/2008:  EF 60%, mean AV gradient 7 mmHg.   . Diabetes mellitus   . Diverticula of colon    per colonoscopy6/20/11  . Edema of lower extremity    wears ted hose  . H/O echocardiogram 2014   EF 65%, slight gradient of aortic valve, no major findings otherwise  . Hx of cardiovascular stress test    Adenosine Myoview 04/2008:  Normal.  . Hyperlipidemia   . Hypertension   . Hypothyroidism   . LBBB (left bundle branch block)   . Obesity   . 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 Tubulovillous adenoma   . Wears dentures    upper  . Wears glasses    Current Outpatient Prescriptions on File Prior to Visit  Medication Sig Dispense Refill  . atorvastatin (LIPITOR) 20 MG tablet Take 1 tablet (20 mg total) by mouth daily. 90 tablet 1  . fluticasone (FLONASE) 50 MCG/ACT nasal spray Place 2 sprays into both nostrils daily. 16 g 11  . levothyroxine (SYNTHROID) 75 MCG tablet Take 1 tablet (75 mcg total) by mouth daily before breakfast. 90 tablet 0  . lisinopril-hydrochlorothiazide (PRINZIDE,ZESTORETIC) 20-12.5 MG tablet Take 1 tablet by mouth daily. 90 tablet 3   No current  facility-administered medications on file prior to visit.     ROS as in subjective   Objective: BP 134/74   Pulse 69   Temp 98.5 F (36.9 C)   Wt 247 lb 3.2 oz (112.1 kg)   SpO2 98%   BMI 39.90 kg/m   General appearance: Alert, WD/WN, no distress, mildly ill appearing                             Skin: warm, no rash                           Head: no sinus tenderness                            Eyes: conjunctiva normal, corneas clear, PERRLA                            Ears: pearly TMs, external ear canals normal                          Nose: septum midline, turbinates swollen, with erythema and clear discharge  Mouth/throat: MMM, tongue normal, mild pharyngeal erythema                           Neck: supple, no adenopathy, no thyromegaly, non tender                          Heart: RRR, normal S1, S2, no murmurs                         Lungs: CTA bilaterally, no wheezes, rales, or rhonchi Ext: mild 1+ bilat nonpitting edema, compression hose present       Assessment  Encounter Diagnoses  Name Primary?  . Cough Yes  . Post-nasal drainage   . Noncompliance   . Edema, unspecified type   . Essential hypertension   . Diabetes mellitus with complication (HCC)   . Hypothyroidism, unspecified type       Plan: Discussed her concerns, noncompliance, diet, exercise.    C/t same medication but let is know if running out.   Recommendations: Your routine medications are as follows: Lisinopril HCT 20/12.5mg  daily for blood pressure and swelling Lipitor/Atorvastatin daily at bedtime for cholesterol Synthroid daily in the morning for thyroid Trulicity shot weekly for diabetes  You should be taking Zyrtec tablet daily at bedtime for allergies.   I prescribed this in March.  You may or may not be taking this, but I would recommend you take this daily for another month   You should also be taking Flonase nasal allergy medication daily  In regards to swelling in  legs  Continue wearing compression hose daily  Elevated the legs in the evening  Cut out added salt in the diet, cut back on fast food, avoid soda.  Take your BP and thyroid medications daily  In regards to cough  I think your cough is related to allergies  Make sure you are taking the zyrtec and Flonase  If you are worse over weekend with ear pain, chest congestion, then begin Azithromycin antibiotic I sent to pharmacy  [name] was seen today for coughing and cold.  Diagnoses and all orders for this visit:  Cough  Post-nasal drainage  Noncompliance  Edema, unspecified type  Essential hypertension  Diabetes mellitus with complication (HCC)  Hypothyroidism, unspecified type  Other orders -     azithromycin (ZITHROMAX) 500 MG tablet; 1 tablet po daily x 3 days -     cetirizine (ZYRTEC) 10 MG tablet; Take 1 tablet (10 mg total) by mouth at bedtime.

## 2016-07-24 NOTE — Patient Instructions (Signed)
  Recommendations: Your routine medications are as follows: Lisinopril HCT 20/12.5mg  daily for blood pressure and swelling Lipitor/Atorvastatin daily at bedtime for cholesterol Synthroid 75mcg daily in the morning for thyroid Trulicity shot weekly for diabetes  You should be taking Zyrtec tablet daily at bedtime for allergies.   I prescribed this in March.  You may or may not be taking this, but I would recommend you take this daily for another month   You should also be taking Flonase nasal allergy medication daily  In regards to swelling in legs  Continue wearing compression hose daily  Elevated the legs in the evening  Cut out added salt in the diet, cut back on fast food, avoid soda.  Take your BP and thyroid medications daily  In regards to cough  I think your cough is related to allergies  Make sure you are taking the zyrtec and Flonase  If you are worse over weekend with ear pain, chest congestion, then begin Azithromycin antibiotic I sent to pharmacy

## 2016-08-06 ENCOUNTER — Ambulatory Visit: Payer: Self-pay | Admitting: Medical

## 2016-08-12 ENCOUNTER — Encounter: Payer: Self-pay | Admitting: Medical

## 2016-08-12 ENCOUNTER — Ambulatory Visit (INDEPENDENT_AMBULATORY_CARE_PROVIDER_SITE_OTHER): Payer: Medicaid Other | Admitting: Medical

## 2016-08-12 VITALS — BP 110/70 | HR 72 | Temp 98.4°F | Wt 247.6 lb

## 2016-08-12 DIAGNOSIS — R600 Localized edema: Secondary | ICD-10-CM | POA: Diagnosis not present

## 2016-08-12 DIAGNOSIS — E039 Hypothyroidism, unspecified: Secondary | ICD-10-CM

## 2016-08-12 DIAGNOSIS — R05 Cough: Secondary | ICD-10-CM | POA: Insufficient documentation

## 2016-08-12 DIAGNOSIS — R059 Cough, unspecified: Secondary | ICD-10-CM | POA: Insufficient documentation

## 2016-08-12 DIAGNOSIS — J301 Allergic rhinitis due to pollen: Secondary | ICD-10-CM

## 2016-08-12 DIAGNOSIS — I10 Essential (primary) hypertension: Secondary | ICD-10-CM

## 2016-08-12 DIAGNOSIS — E785 Hyperlipidemia, unspecified: Secondary | ICD-10-CM

## 2016-08-12 DIAGNOSIS — E118 Type 2 diabetes mellitus with unspecified complications: Secondary | ICD-10-CM

## 2016-08-12 LAB — COMPREHENSIVE METABOLIC PANEL
ALT: 24 U/L (ref 6–29)
AST: 18 U/L (ref 10–35)
Albumin: 3.8 g/dL (ref 3.6–5.1)
Alkaline Phosphatase: 86 U/L (ref 33–130)
BUN: 14 mg/dL (ref 7–25)
CO2: 25 mmol/L (ref 20–31)
Calcium: 9.4 mg/dL (ref 8.6–10.4)
Chloride: 106 mmol/L (ref 98–110)
Creat: 0.82 mg/dL (ref 0.50–0.99)
Glucose, Bld: 81 mg/dL (ref 65–99)
Potassium: 4.4 mmol/L (ref 3.5–5.3)
Sodium: 141 mmol/L (ref 135–146)
Total Bilirubin: 0.6 mg/dL (ref 0.2–1.2)
Total Protein: 7.1 g/dL (ref 6.1–8.1)

## 2016-08-12 LAB — T4, FREE: Free T4: 1.8 ng/dL (ref 0.8–1.8)

## 2016-08-12 LAB — TSH: TSH: 0.02 mIU/L — ABNORMAL LOW

## 2016-08-12 MED ORDER — FUROSEMIDE 20 MG PO TABS: 20.0000 mg | ORAL_TABLET | Freq: Every day | ORAL | 2 refills | Status: DC

## 2016-08-12 MED ORDER — AMOXICILLIN 875 MG PO TABS: 875.0000 mg | ORAL_TABLET | Freq: Two times a day (BID) | ORAL | 0 refills | Status: DC

## 2016-08-12 MED ORDER — POTASSIUM CHLORIDE ER 10 MEQ PO TBCR: 10.0000 meq | EXTENDED_RELEASE_TABLET | Freq: Every day | ORAL | 2 refills | Status: DC

## 2016-08-12 NOTE — Patient Instructions (Signed)
Recommendations  Check your weight daily  If you gain or lose more than 5 lb in a day, call us  Begin Lasix/Furosemide fluid pill once daily in the morning  Also, Begin Potassium/K-dur daily in the morning with the Lasix fluid pill  Use compression hose preferably every day  Elevated the legs when possible  AVOID excess salt and sugar  Avoid soda, limit fast food, don't add salt to food, limit sweets  Eat a healy low fat diet  Walk for exercise daily  We will need to recheck your potasium and lab in 2 weeks after being on Lasix  However, call back within 1 week to let me know how the swelling is doing

## 2016-08-12 NOTE — Progress Notes (Signed)
Subjective: Chief Complaint  Patient presents with  . follow up     follow up from fluid meds, and discuss antiboic    Here for c/o worse leg swelling.   She is compliant with her BP medication, but lately legs stay swollen.   She does endorse eating salt foods, adding salt to food, some fast food.   Exercise is limited.   She denies SOB, dyspnea, calve pain.   Has some discoloration or right lower leg.  otherwise is in usual state of health.  She did see cardiology in recent months, had good report.  Still has congestion from time to time, sinus drainage.  Wants another antibiotic.  zpak didn't help.  No fever, no sick contacts.  No other URI symptoms.   No other aggravating or relieving factors. No other complaint.   Past Medical History:  Diagnosis Date  . Anemia   . Aortic stenosis    Echo 04/2008:  EF 60%, mean AV gradient 7 mmHg.   . Diabetes mellitus   . Diverticula of colon    per colonoscopy6/20/11  . Edema of lower extremity    wears ted hose  . H/O echocardiogram 2014   EF 65%, slight gradient of aortic valve, no major findings otherwise  . Hx of cardiovascular stress test    Adenosine Myoview 04/2008:  Normal.  . Hyperlipidemia   . Hypertension   . Hypothyroidism   . LBBB (left bundle branch block)   . Obesity   . 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 Tubulovillous adenoma   . Wears dentures    upper  . Wears glasses    Current Outpatient Prescriptions on File Prior to Visit  Medication Sig Dispense Refill  . atorvastatin (LIPITOR) 20 MG tablet Take 1 tablet (20 mg total) by mouth daily. 90 tablet 1  . Dulaglutide (TRULICITY) 0.75 MG/0.5ML SOPN Inject 0.5 mLs into the skin once a week. 0.5 mL 5  . fluticasone (FLONASE) 50 MCG/ACT nasal spray Place 2 sprays into both nostrils daily. 16 g 11  . levothyroxine (SYNTHROID) 75 MCG tablet Take 1 tablet (75 mcg total) by mouth daily before breakfast. 90 tablet 0  .  lisinopril-hydrochlorothiazide (PRINZIDE,ZESTORETIC) 20-12.5 MG tablet Take 1 tablet by mouth daily. 90 tablet 3  . cetirizine (ZYRTEC) 10 MG tablet Take 1 tablet (10 mg total) by mouth at bedtime. (Patient not taking: Reported on 08/12/2016) 90 tablet 3   No current facility-administered medications on file prior to visit.     ROS as in subjective   Objective: BP 110/70   Pulse 72   Temp 98.4 F (36.9 C)   Wt 247 lb 9.6 oz (112.3 kg)   SpO2 98%   BMI 39.96 kg/m   General appearance: alert, no distress, WD/WN,  HEENT: normocephalic, sclerae anicteric, TMs pearly, nares patent, no discharge or erythema, pharynx normal Oral cavity: MMM, no lesions Heart: 2/6 holosystolic murmur heard in upper sternal borders, otherwise RRR, normal S1, S2 Lungs: CTA bilaterally, no wheezes, rhonchi, or rales Abdomen: +bs, soft, non tender, non distended, no masses, no hepatomegaly, no splenomegaly Pulses: 2+ symmetric, upper and lower extremities, normal cap refill Ext: bilat lower extremity 1+ pitting edema, brownish tattooing of hemosiderin right medial distal lower leg, no palpable cord, no asymmetry.    Assessment: Encounter Diagnoses  Name Primary?  . Hypothyroidism, unspecified type Yes  . Essential hypertension   . Hyperlipidemia with target low density lipoprotein (LDL) cholesterol less than  100 mg/dL   . Bilateral lower extremity edema   . Diabetes mellitus with complication (HCC)   . Allergic rhinitis due to pollen, unspecified seasonality   . Cough     Plan: Edema - add Lasix and potassium daily, leg elevation, exercise, salt reduction.   Recheck 1-2 weeks.  Reviewed 06/2016 echocardiogram notes.  Hypothyroidism - lab today, compliant with medication  HTN - c/t same medication  Diabetes - labs today, c/t glucose monitoring, needs to make diet and exercise improvements  Hyperlipidemia - c/t same medication  Allergic rhinitis - gave samples of zyrtec and advised she take  zyrtec 10mg  QHS OTC  Cough - likely allergy related.  Begin zyrtec.   If fever, colored mucous, sinus pain persisting for more then a 4-5 days then begin amoxicillin, but no sign of infection today.    Kathryn [name] was seen today for follow up.  Diagnoses and all orders for this visit:  Hypothyroidism, unspecified type -     Hemoglobin A1c -     Comprehensive metabolic panel -     TSH -     T4, free  Essential hypertension -     Hemoglobin A1c -     Comprehensive metabolic panel -     TSH -     T4, free  Hyperlipidemia with target low density lipoprotein (LDL) cholesterol less than 100 mg/dL -     Hemoglobin Z6X -     Comprehensive metabolic panel -     TSH -     T4, free  Bilateral lower extremity edema -     Hemoglobin A1c -     Comprehensive metabolic panel -     TSH -     T4, free  Diabetes mellitus with complication (HCC)  Allergic rhinitis due to pollen, unspecified seasonality  Cough  Other orders -     furosemide (LASIX) 20 MG tablet; Take 1 tablet (20 mg total) by mouth daily. -     potassium chloride (K-DUR) 10 MEQ tablet; Take 1 tablet (10 mEq total) by mouth daily. -     amoxicillin (AMOXIL) 875 MG tablet; Take 1 tablet (875 mg total) by mouth 2 (two) times daily.

## 2016-08-13 ENCOUNTER — Other Ambulatory Visit: Payer: Self-pay | Admitting: Medical

## 2016-08-13 LAB — HEMOGLOBIN A1C
Hgb A1c MFr Bld: 6.3 % — ABNORMAL HIGH (ref <5.7)
Mean Plasma Glucose: 134 mg/dL

## 2016-08-13 MED ORDER — CETIRIZINE HCL 10 MG PO TABS: 10.0000 mg | ORAL_TABLET | Freq: Every day | ORAL | 3 refills | Status: DC

## 2016-08-13 MED ORDER — LEVOTHYROXINE SODIUM 50 MCG PO TABS: 50.0000 ug | ORAL_TABLET | Freq: Every day | ORAL | 1 refills | Status: DC

## 2016-08-13 MED ORDER — DULAGLUTIDE 0.75 MG/0.5ML ~~LOC~~ SOAJ: 0.5000 mL | SUBCUTANEOUS | 5 refills | Status: DC

## 2016-08-14 ENCOUNTER — Telehealth: Payer: Self-pay | Admitting: Medical

## 2016-08-14 NOTE — Telephone Encounter (Signed)
Patient returned nurse call re lab results Read Shane's notes to the patient and she stated she understood. I am going to have Shane's nurse contact her again on Monday to confirm her understanding

## 2016-08-17 NOTE — Telephone Encounter (Signed)
okay

## 2016-11-06 ENCOUNTER — Other Ambulatory Visit: Payer: Self-pay | Admitting: Medical

## 2017-01-16 ENCOUNTER — Other Ambulatory Visit: Payer: Self-pay | Admitting: Medical

## 2017-01-27 ENCOUNTER — Ambulatory Visit (INDEPENDENT_AMBULATORY_CARE_PROVIDER_SITE_OTHER): Payer: Medicare Other | Admitting: Cardiology

## 2017-01-27 ENCOUNTER — Other Ambulatory Visit: Payer: Self-pay | Admitting: Medical

## 2017-01-27 ENCOUNTER — Encounter (INDEPENDENT_AMBULATORY_CARE_PROVIDER_SITE_OTHER): Payer: Self-pay

## 2017-01-27 ENCOUNTER — Telehealth: Payer: Self-pay | Admitting: Medical

## 2017-01-27 ENCOUNTER — Encounter: Payer: Self-pay | Admitting: Cardiology

## 2017-01-27 VITALS — BP 124/82 | HR 69 | Ht 66.0 in | Wt 255.6 lb

## 2017-01-27 DIAGNOSIS — I350 Nonrheumatic aortic (valve) stenosis: Secondary | ICD-10-CM | POA: Diagnosis not present

## 2017-01-27 DIAGNOSIS — R600 Localized edema: Secondary | ICD-10-CM | POA: Diagnosis not present

## 2017-01-27 DIAGNOSIS — I447 Left bundle-branch block, unspecified: Secondary | ICD-10-CM

## 2017-01-27 DIAGNOSIS — E785 Hyperlipidemia, unspecified: Secondary | ICD-10-CM

## 2017-01-27 DIAGNOSIS — I10 Essential (primary) hypertension: Secondary | ICD-10-CM

## 2017-01-27 MED ORDER — DULAGLUTIDE 0.75 MG/0.5ML ~~LOC~~ SOAJ: 0.5000 mL | SUBCUTANEOUS | 0 refills | Status: DC

## 2017-01-27 NOTE — Progress Notes (Signed)
fu

## 2017-01-27 NOTE — Patient Instructions (Signed)
NO MEDICATION CHANGES   Your physician encouraged you to lose weight for better health. The goal is to lose at least 7 to 10 pounds by next office visit. [phone]( [phone] [phone])   Labs in 6 months -June 2019 Will mail lab slip closer to the time. nothing to eat or drink the day of the test. CMP LIPIDS     Your physician wants you to follow-up in 6 MONTHS WITH DR HARDING.You will receive a reminder letter in the mail two months in advance. If you don't receive a letter, please call our office to schedule the follow-up appointment.    If you need a refill on your cardiac medications before your next appointment, please call your pharmacy.

## 2017-01-27 NOTE — Telephone Encounter (Signed)
Pt come in requesting  A refill on her trulicity [facility] [place] [address] - [place], [place] - [address] pt can be reached at [phone] pt made a medcheck appt on 02-02-2017

## 2017-01-27 NOTE — Progress Notes (Signed)
PCP: Jac Canavanysinger, Zebulon Gantt S, PA-C  Clinic Note: Chief Complaint  Patient presents with  . Follow-up    pt denied chest pain and SOB    HPI: Kathryn [name] is a 66 y.o. female with a PMH below who presents today for r annual follow-up of Mild AS, LBBB.  Kathryn [name] was last seen on November 2017 a 3-year follow-up for mild AS & LBBB.   Recent Hospitalizations: none  Studies Reviewed:   Echo 11/2012: EF ~65%. Mild AS Mean gradient: 13mm Hg (S). Peak gradient: 20mm Hg (S).  Echo May 2018: Normal LV size and function.  EF 60-65%.  Possible mild LVOT gradient.  Interval History:  Kathryn [name] presents today for annual follow-up.  She is doing quite well without any major complaints.  She still well overweight and notes that she gets a little bit short of breath when she walks around.  She is "trying to work on losing weight.  She otherwise has not had any symptoms at all of chest tightness or pressure at rest or exertion.  No irregular heartbeats or palpitations.  No syncope/near syncope or TIA/amaurosis fugax.  He did not she denies any PND or orthopnea, does have mild occasional lower extremity swelling that goes down when she elevates her feet. She is had a few colds off and on and has had some chest aching with that but not anything otherwise concerning.   ROS: A comprehensive was performed. Review of Systems  Constitutional: Negative for chills, fever, malaise/fatigue and weight loss.  HENT: Negative for congestion, nosebleeds and sinus pain.   Eyes: Negative.   Respiratory: Positive for cough (Not recently.  Had a cold about a month ago). Negative for shortness of breath and wheezing.   Cardiovascular:       Per history of present illness  Gastrointestinal: Negative.  Negative for blood in stool, constipation, heartburn, nausea and vomiting.  Genitourinary: Negative.  Negative for hematuria.  Musculoskeletal: Positive for joint pain. Negative for back pain and  myalgias.  Skin: Negative.   Neurological: Negative for dizziness and weakness.  Endo/Heme/Allergies: Positive for environmental allergies.    Past Medical History:  Diagnosis Date  . Anemia   . Aortic stenosis    Echo 04/2008:  EF 60%, mean AV gradient 7 mmHg.   . Diabetes mellitus   . Diverticula of colon    per colonoscopy6/20/11  . Edema of lower extremity    wears ted hose  . H/O echocardiogram 2014   EF 65%, slight gradient of aortic valve, no major findings otherwise  . Hx of cardiovascular stress test    Adenosine Myoview 04/2008:  Normal.  . Hyperlipidemia   . Hypertension   . Hypothyroidism   . LBBB (left bundle branch block)   . Obesity   . 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 Tubulovillous adenoma   . Wears dentures    upper  . Wears glasses     Past Surgical History:  Procedure Laterality Date  . CARPAL TUNNEL RELEASE     bilat  . COLECTOMY  08/13/09   laparoscopic converted to open transverse colectomy w/ side to side anastomosis  . COLONOSCOPY  09/01/10   Dr. Charna ElizabethJyothi Mann; polyp, tubular adenoma; repeat 3 years  . LASIK    . TRANSTHORACIC ECHOCARDIOGRAM  11/2012; 06/2016   a. EF~65%. Mild ES. Mean gradient 13 mmHg. Peak 20 mmHg;; b. Normal LV size and function.  EF 60-65%.  Possible  mild LVOT gradient.  Marland Kitchen. VEIN SURGERY  2010   right leg   Current Meds  Medication Sig  . atorvastatin (LIPITOR) 20 MG tablet Take 1 tablet (20 mg total) by mouth daily.  . cetirizine (ZYRTEC) 10 MG tablet Take 1 tablet (10 mg total) by mouth at bedtime.  . fluticasone (FLONASE) 50 MCG/ACT nasal spray Place 2 sprays into both nostrils daily.  . furosemide (LASIX) 20 MG tablet TAKE ONE TABLET BY MOUTH DAILY  . levothyroxine (SYNTHROID, LEVOTHROID) 50 MCG tablet TAKE ONE TABLET BY MOUTH DAILY  . lisinopril-hydrochlorothiazide (PRINZIDE,ZESTORETIC) 20-12.5 MG tablet Take 1 tablet by mouth daily.  . [DISCONTINUED] amoxicillin (AMOXIL) 875 MG  tablet Take 1 tablet (875 mg total) by mouth 2 (two) times daily.  . [DISCONTINUED] Dulaglutide (TRULICITY) 0.75 MG/0.5ML SOPN Inject 0.5 mLs into the skin once a week.  . [DISCONTINUED] potassium chloride (K-DUR,KLOR-CON) 10 MEQ tablet TAKE ONE TABLET BY MOUTH DAILY   Allergies  Allergen Reactions  . Codeine     Social History   Socioeconomic History  . Marital status: Divorced    Spouse name: None  . Number of children: None  . Years of education: None  . Highest education level: None  Social Needs  . Financial resource strain: None  . Food insecurity - worry: None  . Food insecurity - inability: None  . Transportation needs - medical: None  . Transportation needs - non-medical: None  Occupational History  . None  Tobacco Use  . Smoking status: Never Smoker  . Smokeless tobacco: Never Used  Substance and Sexual Activity  . Alcohol use: No  . Drug use: No  . Sexual activity: None    Comment: married, 5 children, works at [employer], exrecise with walking  Other Topics Concern  . None  Social History Narrative   Single, works part time at [employer], exercises with walking, lives alon    Family History  Problem Relation Age of Onset  . Cancer Father        stomach cancer  . Diabetes Sister   . CAD Sister        s/p PCI  . CAD Mother        s/p PCI  . CAD Brother        s/p PCI    Wt Readings from Last 3 Encounters:  01/27/17 255 lb 9.6 oz (115.9 kg)  08/12/16 247 lb 9.6 oz (112.3 kg)  07/24/16 247 lb 3.2 oz (112.1 kg)    PHYSICAL EXAM BP 124/82   Pulse 69   Ht 5\' 6"  (1.676 m)   Wt 255 lb 9.6 oz (115.9 kg)   BMI 41.25 kg/m   Physical Exam  Constitutional: She is oriented to person, place, and time. She appears well-developed and well-nourished.  Morbidly obese.  Well-groomed  HENT:  Head: Normocephalic and atraumatic.  Eyes: EOM are normal.  Neck: No JVD (Unable to assess) present. Carotid bruit is not present.  Cardiovascular: Normal rate,  regular rhythm and normal pulses. PMI is not displaced (Unable to palpate PMI). Exam reveals distant heart sounds. Exam reveals no gallop and no friction rub.  Murmur heard.  Medium-pitched harsh crescendo-decrescendo early systolic murmur is present with a grade of 2/6 at the upper right sternal border radiating to the neck. Pulmonary/Chest: Effort normal and breath sounds normal. No respiratory distress. She has no wheezes. She has no rales.  Abdominal: Soft. Bowel sounds are normal.  Obese  Musculoskeletal: Normal range of motion. She  exhibits edema (Trivial).  Neurological: She is alert and oriented to person, place, and time.  Psychiatric: She has a normal mood and affect. Her behavior is normal. Judgment and thought content normal.  Nursing note and vitals reviewed.   Adult ECG Report  Rate: 69;  Rhythm: normal sinus rhythm and LBBB. ; Borderline left axis deviation (-31 degrees)  Narrative Interpretation: Stable EKG   Other studies Reviewed: Additional studies/ records that were reviewed today include:  Recent Labs:   Lab Results  Component Value Date   CHOL 131 07/17/2015   HDL 46 07/17/2015   LDLCALC 65 07/17/2015   TRIG 101 07/17/2015   CHOLHDL 2.8 07/17/2015   Lab Results  Component Value Date   CREATININE 0.82 08/12/2016      ASSESSMENT / PLAN:  As part of her cardiovascular risk assessment, her lipids and blood pressure well controlled, her weight is still poor.  She has a family history of CAD. Plan is to have her return in 6 months to reassess lipids and weight loss.  We will consider CT coronary calcium score at that time.  Problem List Items Addressed This Visit    Bilateral lower extremity edema (Chronic)    Well-controlled on the left HCTZ component of lisinopril.  She still uses Lasix but not every day.  Mostly as needed. I also recommended considering support stockings, however she says that it is just too hard to find was a bit her.      Relevant  Orders   Comprehensive metabolic panel   Essential hypertension (Chronic)   Hyperlipidemia with target low density lipoprotein (LDL) cholesterol less than 100 mg/dL (Chronic)   Relevant Orders   Lipid panel   Comprehensive metabolic panel   LBBB (left bundle branch block) (Chronic)    Chronic.  Preserved EF on echo for now. We will be following echo is relatively routinely every couple years so we will be to assess for any worsening function.      Relevant Orders   EKG 12-Lead (Completed)   Mild aortic stenosis by prior echocardiogram - Primary (Chronic)    Stable echo findings this year compared to 2014.  Can recheck in 2 years. No active symptoms.  Otherwise normal echo.      Relevant Orders   EKG 12-Lead (Completed)   Comprehensive metabolic panel   Morbid obesity (HCC) (Chronic)    We discussed various options for how to lose weight.  I think at this point crash diets and not on work.  She simply needs to adjust her dietary intake with simple diet such as the DASH diet or the Mediterranean style diet.  But more importantly she needs to just eat less and exercise.  Avoid sweets and sodas etc.         Current medicines are reviewed at length with the patient today. (+/- concerns) none  The following changes have been made: none  Patient Instructions  NO MEDICATION CHANGES   Your physician encouraged you to lose weight for better health. The goal is to lose at least 7 to 10 pounds by next office visit. [phone] LBS)   Labs in 6 months -June 2019 Will mail lab slip closer to the time. nothing to eat or drink the day of the test. CMP LIPIDS     Your physician wants you to follow-up in 6 MONTHS WITH DR Codi Folkerts.You will receive a reminder letter in the mail two months in advance. If you don't receive a letter, please call  our office to schedule the follow-up appointment.    If you need a refill on your cardiac medications before your next appointment, please call  your pharmacy.    Studies Ordered:   Orders Placed This Encounter  Procedures  . Lipid panel  . Comprehensive metabolic panel  . EKG 12-Lead      Bryan Lemma, M.D., M.S. Interventional Cardiologist   Pager # [phone] Phone # [phone] [address] [place], [place] [address]

## 2017-01-29 ENCOUNTER — Encounter: Payer: Self-pay | Admitting: Cardiology

## 2017-01-29 NOTE — Assessment & Plan Note (Signed)
Chronic.  Preserved EF on echo for now. We will be following echo is relatively routinely every couple years so we will be to assess for any worsening function.

## 2017-01-29 NOTE — Assessment & Plan Note (Signed)
We discussed various options for how to lose weight.  I think at this point crash diets and not on work.  She simply needs to adjust her dietary intake with simple diet such as the DASH diet or the Mediterranean style diet.  But more importantly she needs to just eat less and exercise.  Avoid sweets and sodas etc.

## 2017-01-29 NOTE — Assessment & Plan Note (Addendum)
Well-controlled on the left HCTZ component of lisinopril.  She still uses Lasix but not every day.  Mostly as needed. I also recommended considering support stockings, however she says that it is just too hard to find was a bit her.

## 2017-01-29 NOTE — Assessment & Plan Note (Signed)
Stable echo findings this year compared to 2014.  Can recheck in 2 years. No active symptoms.  Otherwise normal echo.

## 2017-02-02 ENCOUNTER — Encounter: Payer: Self-pay | Admitting: Medical

## 2017-02-03 ENCOUNTER — Ambulatory Visit (INDEPENDENT_AMBULATORY_CARE_PROVIDER_SITE_OTHER): Payer: Medicare Other | Admitting: Medical

## 2017-02-03 ENCOUNTER — Encounter: Payer: Self-pay | Admitting: Medical

## 2017-02-03 VITALS — BP 116/70 | HR 58 | Wt 255.0 lb

## 2017-02-03 DIAGNOSIS — E785 Hyperlipidemia, unspecified: Secondary | ICD-10-CM | POA: Diagnosis not present

## 2017-02-03 DIAGNOSIS — I10 Essential (primary) hypertension: Secondary | ICD-10-CM

## 2017-02-03 DIAGNOSIS — J988 Other specified respiratory disorders: Secondary | ICD-10-CM | POA: Diagnosis not present

## 2017-02-03 DIAGNOSIS — R05 Cough: Secondary | ICD-10-CM | POA: Diagnosis not present

## 2017-02-03 DIAGNOSIS — Z23 Encounter for immunization: Secondary | ICD-10-CM | POA: Insufficient documentation

## 2017-02-03 DIAGNOSIS — Z0000 Encounter for general adult medical examination without abnormal findings: Secondary | ICD-10-CM | POA: Insufficient documentation

## 2017-02-03 DIAGNOSIS — R059 Cough, unspecified: Secondary | ICD-10-CM

## 2017-02-03 DIAGNOSIS — Z1231 Encounter for screening mammogram for malignant neoplasm of breast: Secondary | ICD-10-CM | POA: Diagnosis not present

## 2017-02-03 DIAGNOSIS — Z113 Encounter for screening for infections with a predominantly sexual mode of transmission: Secondary | ICD-10-CM | POA: Insufficient documentation

## 2017-02-03 DIAGNOSIS — Z1211 Encounter for screening for malignant neoplasm of colon: Secondary | ICD-10-CM

## 2017-02-03 DIAGNOSIS — R109 Unspecified abdominal pain: Secondary | ICD-10-CM | POA: Diagnosis not present

## 2017-02-03 DIAGNOSIS — E039 Hypothyroidism, unspecified: Secondary | ICD-10-CM

## 2017-02-03 DIAGNOSIS — Z129 Encounter for screening for malignant neoplasm, site unspecified: Secondary | ICD-10-CM | POA: Diagnosis not present

## 2017-02-03 DIAGNOSIS — E7800 Pure hypercholesterolemia, unspecified: Secondary | ICD-10-CM | POA: Diagnosis not present

## 2017-02-03 DIAGNOSIS — E118 Type 2 diabetes mellitus with unspecified complications: Secondary | ICD-10-CM | POA: Diagnosis not present

## 2017-02-03 DIAGNOSIS — Z1239 Encounter for other screening for malignant neoplasm of breast: Secondary | ICD-10-CM | POA: Insufficient documentation

## 2017-02-03 LAB — CBC
HCT: 38.9 % (ref 35.0–45.0)
Hemoglobin: 12.4 g/dL (ref 11.7–15.5)
MCH: 25.5 pg — ABNORMAL LOW (ref 27.0–33.0)
MCHC: 31.9 g/dL — ABNORMAL LOW (ref 32.0–36.0)
MCV: 79.9 fL — ABNORMAL LOW (ref 80.0–100.0)
MPV: 10.5 fL (ref 7.5–12.5)
Platelets: 239 10*3/uL (ref 140–400)
RBC: 4.87 10*6/uL (ref 3.80–5.10)
RDW: 14 % (ref 11.0–15.0)
WBC: 4.1 10*3/uL (ref 3.8–10.8)

## 2017-02-03 MED ORDER — AZITHROMYCIN 250 MG PO TABS: ORAL_TABLET | ORAL | 0 refills | Status: DC

## 2017-02-03 NOTE — Addendum Note (Signed)
Addended by: Jac CanavanYSINGER, DAVID S on: 02/03/2017 12:22 PM   Modules accepted: Orders

## 2017-02-03 NOTE — Progress Notes (Signed)
Subjective: Chief Complaint  Patient presents with  . med check    med check , stomach pain on side    Here for med check.  Here today alone.  Her daughter is living with her for now with her grandchild who is 64 years old.  She is requesting medication to help with cough and congestion for over a week.  She reports cough, productive cough, head pressure, mild sore throat.  No fever.  Been having some pain in left lower abdomen.   Worse if rain or snow outside.  Has intermittent pain for past year.   Greasy foods makes it worse.  No nausea, no vomiting, no diarrhea, no blood in stool.   No constipation.    Is having some urinary frequency at times.  Attributes this to her medication.  Does get dry mouth at times, sometimes increased thirst.  No vaginal c/o.     She saw her heart doctor last week, was advised to lose weight.     Sometimes checks blood sugars, usually good numbers.   Doesn't check BPs.     Not exercising much.   She is retired.   Gets out some.    Past Medical History:  Diagnosis Date  . Anemia   . Aortic stenosis    Echo 04/2008:  EF 60%, mean AV gradient 7 mmHg.   . Diabetes mellitus   . Diverticula of colon    per colonoscopy6/20/11  . Edema of lower extremity    wears ted hose  . H/O echocardiogram 2014   EF 65%, slight gradient of aortic valve, no major findings otherwise  . Hx of cardiovascular stress test    Adenosine Myoview 04/2008:  Normal.  . Hyperlipidemia   . Hypertension   . Hypothyroidism   . LBBB (left bundle branch block)   . Obesity   . 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 Tubulovillous adenoma   . Wears dentures    upper  . Wears glasses    Current Outpatient Medications on File Prior to Visit  Medication Sig Dispense Refill  . atorvastatin (LIPITOR) 20 MG tablet Take 1 tablet (20 mg total) by mouth daily. 90 tablet 1  . Dulaglutide (TRULICITY) 0.75 MG/0.5ML SOPN Inject 0.5 mLs into the skin once a  week. 2 mL 0  . furosemide (LASIX) 20 MG tablet TAKE ONE TABLET BY MOUTH DAILY 30 tablet 0  . levothyroxine (SYNTHROID, LEVOTHROID) 50 MCG tablet TAKE ONE TABLET BY MOUTH DAILY 30 tablet 0  . lisinopril-hydrochlorothiazide (PRINZIDE,ZESTORETIC) 20-12.5 MG tablet Take 1 tablet by mouth daily. 90 tablet 3  . potassium chloride (K-DUR,KLOR-CON) 10 MEQ tablet TAKE ONE TABLET BY MOUTH DAILY 30 tablet 0  . cetirizine (ZYRTEC) 10 MG tablet Take 1 tablet (10 mg total) by mouth at bedtime. (Patient not taking: Reported on 02/03/2017) 90 tablet 3   No current facility-administered medications on file prior to visit.    Past Surgical History:  Procedure Laterality Date  . CARPAL TUNNEL RELEASE     bilat  . COLECTOMY  08/13/09   laparoscopic converted to open transverse colectomy w/ side to side anastomosis  . COLONOSCOPY  09/01/10   Dr. Charna ElizabethJyothi Mann; polyp, tubular adenoma; repeat 3 years  . LASIK    . TRANSTHORACIC ECHOCARDIOGRAM  11/2012; 06/2016   a. EF~65%. Mild ES. Mean gradient 13 mmHg. Peak 20 mmHg;; b. Normal LV size and function.  EF 60-65%.  Possible mild LVOT gradient.  .Marland Kitchen  VEIN SURGERY  2010   right leg    ROS as in subjective   Objective: BP 116/70   Pulse (!) 58   Wt 255 lb (115.7 kg)   SpO2 98%   BMI 41.16 kg/m   Wt Readings from Last 3 Encounters:  02/03/17 255 lb (115.7 kg)  01/27/17 255 lb 9.6 oz (115.9 kg)  08/12/16 247 lb 9.6 oz (112.3 kg)   BP Readings from Last 3 Encounters:  02/03/17 116/70  01/27/17 124/82  08/12/16 110/70   General appearance: alert, no distress, WD/WN, AA female, obese HEENT: normocephalic, sclerae anicteric, TMs pearly, nares patent, mild mucoid discharge, mild erythema, pharynx with mild erythema Oral cavity: MMM, no lesions Neck: supple, no lymphadenopathy, no thyromegaly, no masses Heart: RRR, normal S1, S2, no murmurs Lungs: CTA bilaterally, no wheezes, rhonchi, or rales Abdomen: +bs, soft, obese, non tender, non distended, no masses,  no hepatomegaly, no splenomegaly Chest wall nontender no deformity Back nontender Pulses: 2+ symmetric, upper and lower extremities, normal cap refill Extremities: No edema Psych: Pleasant, good eye contact, but always suspect with her answers, suspect below average intelligence  Diabetic Foot Exam - Simple   Simple Foot Form Diabetic Foot exam was performed with the following findings:  Yes 02/03/2017 11:53 AM  Visual Inspection No deformities, no ulcerations, no other skin breakdown bilaterally:  Yes Sensation Testing Intact to touch and monofilament testing bilaterally:  Yes Pulse Check Posterior Tibialis and Dorsalis pulse intact bilaterally:  Yes Comments      Assessment: Encounter Diagnoses  Name Primary?  . Essential hypertension Yes  . Diabetes mellitus with complication (HCC)   . Hypothyroidism, unspecified type   . Morbid obesity (HCC)   . Pure hypercholesterolemia   . Hyperlipidemia with target low density lipoprotein (LDL) cholesterol less than 100 mg/dL   . Need for influenza vaccination   . Screening for cancer   . Cough   . Respiratory tract infection   . Screening for breast cancer   . Screen for colon cancer   . Abdominal pain, unspecified abdominal location     Plan: Hypertension-continue current medication, reviewed her recent December cardiology notes   Hyperlipidemia-labs today, continue current medication  diabetes-Advised weight loss, healthy diet, glucose monitoring, labs today  Abdominal pain-Pending labs, consider CT abdomen pelvis   Hypothyroidism-labs today, continue current medication  She is due for cancer screening so will send for mammogram, refer back to GI for colonoscopy, and she will need to return for Pap smear at her convenience  URI, cough- recommended Mucinex DM over-the-counter, hydration, and if not improving within the next 3-5 days can begin Z-Pak.  Counseled on the influenza virus vaccine.  Vaccine information sheet  given.  Influenza vaccine given after consent obtained.  Jhanvi was seen today for med check.  Diagnoses and all orders for this visit:  Essential hypertension -     Comprehensive metabolic panel -     Hemoglobin A1c -     Lipid panel  Diabetes mellitus with complication (HCC) -     Hemoglobin A1c  Hypothyroidism, unspecified type -     TSH -     T4, free  Morbid obesity (HCC)  Pure hypercholesterolemia  Hyperlipidemia with target low density lipoprotein (LDL) cholesterol less than 100 mg/dL -     Lipid panel  Need for influenza vaccination  Screening for cancer  Cough  Respiratory tract infection  Screening for breast cancer -     MM DIGITAL SCREENING BILATERAL; Future  Screen for colon cancer -     Ambulatory referral to Gastroenterology  Abdominal pain, unspecified abdominal location -     Lipase  Other orders -     azithromycin (ZITHROMAX) 250 MG tablet; 2 tablets day 1, then 1 tablet days 2-4   Spent > 45 minutes face to face with patient in discussion of symptoms, evaluation, plan and recommendations.

## 2017-02-03 NOTE — Addendum Note (Signed)
Addended by: Winn JockVALENTINE, Yosselin Zoeller N on: 02/03/2017 05:04 PM   Modules accepted: Orders

## 2017-02-03 NOTE — Patient Instructions (Addendum)
Recommendations:  You are due for updated mammogram and Pap smear  Please schedule mammogram  We will refer you back to Dr. Loreta AveMann for updated colonoscopy  We updated your flu shot today  I recommend you have a shingles vaccine to help prevent shingles or herpes zoster outbreak.   Please call your insurer to inquire about coverage for the Shingrix vaccine given in 2 doses.   Some insurers cover this vaccine after age 64, some cover this after age 560.  If your insurer covers this, then call to schedule appointment to have this vaccine here.  Please try to exercise most days per week such as 45-60 minutes of walking daily  Eat a healthy low-fat diet and try to lose weight.  Set a goal to try to lose 20 pounds in the next 3 months.  Continue her current medications   you need to be checking your blood sugar at least 3-4 days/week fasting in the morning and write these numbers down   Diet  Increase your water intake, get at least 64 ounces of water daily  Eat 3-4 fruits daily  Eat plenty of vegetables throughout the day, preferably each meal  Eat good sources of grains such as oatmeal, barley, whole grain pasta, whole grain bread, but limit the serving size to 1 cup of oatmeal or pasta per meal or 2 slices of bread per meal  We don't need to meat at each meal, however if you do eat meat, limit serving size to the size of your palm, and eat chicken fish or [place], lean cuts of meat  Eat beans every day as this is a good nutrient source and helps to curb appetite  Consider using a program such as Weight Watchers  Consider using a Smart phone app such as My [employer] or [employer] to track your calories and progress   Things to limit or avoid:  Avoid fast food, fried foods, fatty foods  Limit sweets, ice cream, cake and other baked goods  Avoid soda, beer, alcohol, sweet tea

## 2017-02-04 ENCOUNTER — Other Ambulatory Visit: Payer: Self-pay | Admitting: Medical

## 2017-02-04 LAB — COMPREHENSIVE METABOLIC PANEL
AG Ratio: 1.2 (calc) (ref 1.0–2.5)
ALT: 19 U/L (ref 6–29)
AST: 20 U/L (ref 10–35)
Albumin: 4.1 g/dL (ref 3.6–5.1)
Alkaline phosphatase (APISO): 82 U/L (ref 33–130)
BUN: 13 mg/dL (ref 7–25)
CO2: 29 mmol/L (ref 20–32)
Calcium: 10 mg/dL (ref 8.6–10.4)
Chloride: 101 mmol/L (ref 98–110)
Creat: 0.87 mg/dL (ref 0.50–0.99)
Globulin: 3.5 g/dL (calc) (ref 1.9–3.7)
Glucose, Bld: 90 mg/dL (ref 65–99)
Potassium: 4.4 mmol/L (ref 3.5–5.3)
Sodium: 138 mmol/L (ref 135–146)
Total Bilirubin: 0.5 mg/dL (ref 0.2–1.2)
Total Protein: 7.6 g/dL (ref 6.1–8.1)

## 2017-02-04 LAB — T4, FREE: Free T4: 0.9 ng/dL (ref 0.8–1.8)

## 2017-02-04 LAB — LIPID PANEL
Cholesterol: 168 mg/dL (ref <200)
HDL: 41 mg/dL — ABNORMAL LOW (ref >50)
LDL Cholesterol (Calc): 104 mg/dL (calc) — ABNORMAL HIGH
Non-HDL Cholesterol (Calc): 127 mg/dL (calc) (ref <130)
Total CHOL/HDL Ratio: 4.1 (calc) (ref <5.0)
Triglycerides: 126 mg/dL (ref <150)

## 2017-02-04 LAB — HEMOGLOBIN A1C
Hgb A1c MFr Bld: 6 % of total Hgb — ABNORMAL HIGH (ref <5.7)
Mean Plasma Glucose: 126 (calc)
eAG (mmol/L): 7 (calc)

## 2017-02-04 LAB — TSH: TSH: 3.49 mIU/L (ref 0.40–4.50)

## 2017-02-04 LAB — LIPASE: Lipase: 36 U/L (ref 7–60)

## 2017-02-04 MED ORDER — ATORVASTATIN CALCIUM 20 MG PO TABS: 20.0000 mg | ORAL_TABLET | Freq: Every day | ORAL | 1 refills | Status: DC

## 2017-02-04 MED ORDER — FUROSEMIDE 20 MG PO TABS: 20.0000 mg | ORAL_TABLET | Freq: Every day | ORAL | 1 refills | Status: DC

## 2017-02-04 MED ORDER — DULAGLUTIDE 0.75 MG/0.5ML ~~LOC~~ SOAJ
0.5000 mL | SUBCUTANEOUS | 5 refills | Status: DC
Start: 2017-02-04 — End: 2017-05-31

## 2017-02-04 MED ORDER — POTASSIUM CHLORIDE CRYS ER 10 MEQ PO TBCR: 10.0000 meq | EXTENDED_RELEASE_TABLET | Freq: Every day | ORAL | 2 refills | Status: DC

## 2017-02-04 MED ORDER — LEVOTHYROXINE SODIUM 50 MCG PO TABS: 50.0000 ug | ORAL_TABLET | Freq: Every day | ORAL | 3 refills | Status: DC

## 2017-02-25 DIAGNOSIS — I83813 Varicose veins of bilateral lower extremities with pain: Secondary | ICD-10-CM | POA: Diagnosis not present

## 2017-02-25 DIAGNOSIS — I8311 Varicose veins of right lower extremity with inflammation: Secondary | ICD-10-CM | POA: Diagnosis not present

## 2017-02-25 DIAGNOSIS — I83893 Varicose veins of bilateral lower extremities with other complications: Secondary | ICD-10-CM | POA: Diagnosis not present

## 2017-02-25 DIAGNOSIS — I8312 Varicose veins of left lower extremity with inflammation: Secondary | ICD-10-CM | POA: Diagnosis not present

## 2017-03-10 DIAGNOSIS — I8311 Varicose veins of right lower extremity with inflammation: Secondary | ICD-10-CM | POA: Diagnosis not present

## 2017-03-10 DIAGNOSIS — I8312 Varicose veins of left lower extremity with inflammation: Secondary | ICD-10-CM | POA: Diagnosis not present

## 2017-03-22 ENCOUNTER — Encounter: Payer: Self-pay | Admitting: Medical

## 2017-03-22 DIAGNOSIS — I8311 Varicose veins of right lower extremity with inflammation: Secondary | ICD-10-CM | POA: Diagnosis not present

## 2017-03-22 DIAGNOSIS — I83893 Varicose veins of bilateral lower extremities with other complications: Secondary | ICD-10-CM | POA: Diagnosis not present

## 2017-03-22 DIAGNOSIS — I8312 Varicose veins of left lower extremity with inflammation: Secondary | ICD-10-CM | POA: Diagnosis not present

## 2017-03-22 DIAGNOSIS — I83813 Varicose veins of bilateral lower extremities with pain: Secondary | ICD-10-CM | POA: Diagnosis not present

## 2017-05-10 ENCOUNTER — Encounter: Payer: Self-pay | Admitting: Medical

## 2017-05-14 ENCOUNTER — Encounter: Payer: Self-pay | Admitting: Medical

## 2017-05-28 ENCOUNTER — Other Ambulatory Visit: Payer: Self-pay | Admitting: Medical

## 2017-05-28 ENCOUNTER — Encounter: Payer: Self-pay | Admitting: Medical

## 2017-05-28 ENCOUNTER — Ambulatory Visit (INDEPENDENT_AMBULATORY_CARE_PROVIDER_SITE_OTHER): Payer: 59 | Admitting: Medical

## 2017-05-28 VITALS — BP 124/82 | HR 60 | Ht 65.7 in | Wt 263.4 lb

## 2017-05-28 DIAGNOSIS — Z0000 Encounter for general adult medical examination without abnormal findings: Secondary | ICD-10-CM

## 2017-05-28 DIAGNOSIS — K635 Polyp of colon: Secondary | ICD-10-CM

## 2017-05-28 DIAGNOSIS — D539 Nutritional anemia, unspecified: Secondary | ICD-10-CM | POA: Diagnosis not present

## 2017-05-28 DIAGNOSIS — I350 Nonrheumatic aortic (valve) stenosis: Secondary | ICD-10-CM | POA: Diagnosis not present

## 2017-05-28 DIAGNOSIS — J301 Allergic rhinitis due to pollen: Secondary | ICD-10-CM | POA: Diagnosis not present

## 2017-05-28 DIAGNOSIS — I10 Essential (primary) hypertension: Secondary | ICD-10-CM | POA: Diagnosis not present

## 2017-05-28 DIAGNOSIS — F09 Unspecified mental disorder due to known physiological condition: Secondary | ICD-10-CM | POA: Diagnosis not present

## 2017-05-28 DIAGNOSIS — R600 Localized edema: Secondary | ICD-10-CM | POA: Diagnosis not present

## 2017-05-28 DIAGNOSIS — E039 Hypothyroidism, unspecified: Secondary | ICD-10-CM | POA: Diagnosis not present

## 2017-05-28 DIAGNOSIS — Z780 Asymptomatic menopausal state: Secondary | ICD-10-CM

## 2017-05-28 DIAGNOSIS — Z7185 Encounter for immunization safety counseling: Secondary | ICD-10-CM | POA: Insufficient documentation

## 2017-05-28 DIAGNOSIS — R4189 Other symptoms and signs involving cognitive functions and awareness: Secondary | ICD-10-CM

## 2017-05-28 DIAGNOSIS — E2839 Other primary ovarian failure: Secondary | ICD-10-CM

## 2017-05-28 DIAGNOSIS — Z129 Encounter for screening for malignant neoplasm, site unspecified: Secondary | ICD-10-CM

## 2017-05-28 DIAGNOSIS — E785 Hyperlipidemia, unspecified: Secondary | ICD-10-CM | POA: Diagnosis not present

## 2017-05-28 DIAGNOSIS — Z7189 Other specified counseling: Secondary | ICD-10-CM | POA: Insufficient documentation

## 2017-05-28 DIAGNOSIS — R945 Abnormal results of liver function studies: Secondary | ICD-10-CM | POA: Diagnosis not present

## 2017-05-28 DIAGNOSIS — I447 Left bundle-branch block, unspecified: Secondary | ICD-10-CM | POA: Diagnosis not present

## 2017-05-28 DIAGNOSIS — E118 Type 2 diabetes mellitus with unspecified complications: Secondary | ICD-10-CM | POA: Diagnosis not present

## 2017-05-28 DIAGNOSIS — Z23 Encounter for immunization: Secondary | ICD-10-CM | POA: Insufficient documentation

## 2017-05-28 DIAGNOSIS — Z1231 Encounter for screening mammogram for malignant neoplasm of breast: Secondary | ICD-10-CM

## 2017-05-28 DIAGNOSIS — R7989 Other specified abnormal findings of blood chemistry: Secondary | ICD-10-CM

## 2017-05-28 DIAGNOSIS — Z1239 Encounter for other screening for malignant neoplasm of breast: Secondary | ICD-10-CM

## 2017-05-28 DIAGNOSIS — Z113 Encounter for screening for infections with a predominantly sexual mode of transmission: Secondary | ICD-10-CM

## 2017-05-28 LAB — POCT URINALYSIS DIP (PROADVANTAGE DEVICE)
Bilirubin, UA: NEGATIVE
Blood, UA: NEGATIVE
Glucose, UA: NEGATIVE mg/dL
Ketones, POC UA: NEGATIVE mg/dL
Nitrite, UA: NEGATIVE
Protein Ur, POC: NEGATIVE mg/dL
Specific Gravity, Urine: 1.015
Urobilinogen, Ur: NEGATIVE
pH, UA: 6.5 (ref 5.0–8.0)

## 2017-05-28 NOTE — Progress Notes (Signed)
Subjective:    Kathryn [name] is a 67 y.o. female who presents for Preventative Services visit and chronic medical problems/med check visit.    Of note, the visit was somewhat abbreviated or hurried due to insurance issues at checking and the fact that she wasn't ready with triage until 30 minutes into the appt.    Primary Care Provider Sherle Mello, Kermit Balo, PA-C here for primary care  Current Health Care Team:  Sees dentist and eye doctor  Dr. Bryan Lemma, cardiology  Gynecology?  [facility]  Medical Services you may have received from other than [facility] providers in the past year (date may be approximate) [facility] this past year  Exercise Current exercise habits: not much, some walking   Nutrition/Diet Current diet: eats chicken, fish, not much beef.  Eats some fast food.  Drinks sweet tea rarely, no soda.  Drinks water.  Depression Screen Depression screen [facility] 2/9 05/28/2017  Decreased Interest 0  Down, Depressed, Hopeless 0  PHQ - 2 Score 0    Activities of Daily Living Screen/Functional Status Survey Is the patient deaf or have difficulty hearing?: No Does the patient have difficulty seeing, even when wearing glasses/contacts?: Yes Does the patient have difficulty concentrating, remembering, or making decisions?: No Does the patient have difficulty walking or climbing stairs?: No Does the patient have difficulty dressing or bathing?: No Does the patient have difficulty doing errands alone such as visiting a doctor's office or shopping?: No  Can patient draw a clock face showing 3:15 oclock, yes  Fall Risk Screen Fall Risk  05/28/2017  Falls in the past year? No    Gait Assessment: Normal gait observed yes  Advanced directives Does patient have a Health Care Power of Attorney? No Does patient have a Living Will? No  Past Medical History:  Diagnosis Date  . Anemia   . Aortic stenosis    Echo 04/2008:  EF 60%, mean AV gradient  7 mmHg.   . Diabetes mellitus   . Diverticula of colon    per colonoscopy6/20/11  . Edema of lower extremity    wears ted hose  . H/O echocardiogram 2014   EF 65%, slight gradient of aortic valve, no major findings otherwise  . Hx of cardiovascular stress test    Adenosine Myoview 04/2008:  Normal.  . Hyperlipidemia   . Hypertension   . Hypothyroidism   . LBBB (left bundle branch block)   . Obesity   . 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 Tubulovillous adenoma   . Wears dentures    upper  . Wears glasses     Past Surgical History:  Procedure Laterality Date  . CARPAL TUNNEL RELEASE     bilat  . COLECTOMY  08/13/09   laparoscopic converted to open transverse colectomy w/ side to side anastomosis  . COLONOSCOPY  09/01/10   Dr. Charna Elizabeth; polyp, tubular adenoma; repeat 3 years  . LASIK    . TRANSTHORACIC ECHOCARDIOGRAM  11/2012; 06/2016   a. EF~65%. Mild ES. Mean gradient 13 mmHg. Peak 20 mmHg;; b. Normal LV size and function.  EF 60-65%.  Possible mild LVOT gradient.  Marland Kitchen VEIN SURGERY  2010   right leg    Social History   Socioeconomic History  . Marital status: Divorced    Spouse name: Not on file  . Number of children: Not on file  . Years of education: Not on file  . Highest education level: Not  on file  Occupational History  . Not on file  Social Needs  . Financial resource strain: Not on file  . Food insecurity:    Worry: Not on file    Inability: Not on file  . Transportation needs:    Medical: Not on file    Non-medical: Not on file  Tobacco Use  . Smoking status: Never Smoker  . Smokeless tobacco: Never Used  Substance and Sexual Activity  . Alcohol use: No  . Drug use: No  . Sexual activity: Not on file    Comment: married, 5 children, works at [employer], exrecise with walking  Lifestyle  . Physical activity:    Days per week: Not on file    Minutes per session: Not on file  . Stress: Not on file    Relationships  . Social connections:    Talks on phone: Not on file    Gets together: Not on file    Attends religious service: Not on file    Active member of club or organization: Not on file    Attends meetings of clubs or organizations: Not on file    Relationship status: Not on file  . Intimate partner violence:    Fear of current or ex partner: Not on file    Emotionally abused: Not on file    Physically abused: Not on file    Forced sexual activity: Not on file  Other Topics Concern  . Not on file  Social History Narrative   Single, works part time at [employer], exercises with walking, lives alon    Family History  Problem Relation Age of Onset  . Cancer Father        stomach cancer  . Diabetes Sister   . CAD Sister        s/p PCI  . CAD Mother        s/p PCI  . CAD Brother        s/p PCI     Current Outpatient Medications:  .  ALPRAZolam (XANAX) 0.5 MG tablet, BRING TO OFFICE ON DAY OF PROCEDURE TO BE DISPENSED BY MEDICAL STAFF, Disp: , Rfl: 0 .  atorvastatin (LIPITOR) 20 MG tablet, Take 1 tablet (20 mg total) by mouth daily., Disp: 90 tablet, Rfl: 1 .  Dulaglutide (TRULICITY) 0.75 MG/0.5ML SOPN, Inject 0.5 mLs into the skin once a week., Disp: 2 mL, Rfl: 5 .  furosemide (LASIX) 20 MG tablet, Take 1 tablet (20 mg total) by mouth daily., Disp: 90 tablet, Rfl: 1 .  levothyroxine (SYNTHROID, LEVOTHROID) 50 MCG tablet, Take 1 tablet (50 mcg total) by mouth daily., Disp: 90 tablet, Rfl: 3 .  lisinopril-hydrochlorothiazide (PRINZIDE,ZESTORETIC) 20-12.5 MG tablet, Take 1 tablet by mouth daily., Disp: 90 tablet, Rfl: 3 .  azithromycin (ZITHROMAX) 250 MG tablet, 2 tablets day 1, then 1 tablet days 2-4 (Patient not taking: Reported on 05/28/2017), Disp: 6 tablet, Rfl: 0 .  cetirizine (ZYRTEC) 10 MG tablet, Take 1 tablet (10 mg total) by mouth at bedtime. (Patient not taking: Reported on 02/03/2017), Disp: 90 tablet, Rfl: 3 .  potassium chloride (K-DUR,KLOR-CON) 10 MEQ  tablet, Take 1 tablet (10 mEq total) by mouth daily. (Patient not taking: Reported on 05/28/2017), Disp: 90 tablet, Rfl: 2  Allergies  Allergen Reactions  . Codeine     History reviewed: allergies, current medications, past family history, past medical history, past social history, past surgical history and problem list   Acute issues discussed: Having some cold  symptoms, nose running, congestion.  Having some itchy watery eyes.  Wants antibiotic.     Chronic issues discussed: Hyperlipidemia - compliant with Lipitor 20mg    Diabetes - compliant with Trulicity weekly  Hypothyroidism - compliant with Synthroid 50mcg daily  HTN - compliant with lisinopril HCT 20/12.5mg  daily, Lasix 20mg  daily, K-dur 10 daily  Lives with daughter who is 27yo and her grand child.  Not currently working.    Objective:     Biometrics BP 124/82 (BP Location: Right Arm, Patient Position: Sitting, Cuff Size: Normal)   Pulse 60   Ht 5' 5.7" (1.669 m)   Wt 263 lb 6.4 oz (119.5 kg)   SpO2 98%   BMI 42.90 kg/m   Cognitive Testing  Alert? Yes  Normal Appearance?Yes  Oriented to person? Yes  Place? Yes   Time? Yes  Recall of three objects?  Yes  Can perform simple calculations? Yes  Displays appropriate judgment?Yes, but questionable judgement at times in the past and seems slow with mental processing at times  Can read the correct time from a watch face?Yes  General appearance: alert, no distress, WD/WN, AA female  Nutritional Status: Inadequate calore intake? yes Loss of muscle mass? no Loss of fat beneath skin? no Localized or general edema? yes Diminished functional status? Not necessarily but some questionable status  Other pertinent exam: HEENT: normocephalic, sclerae anicteric, TMs pearly, nares patent, no discharge or erythema, pharynx normal Oral cavity: MMM, no lesions Neck: supple, no lymphadenopathy, no thyromegaly, no masses, no bruits Heart: RRR, normal S1, S2, no  murmurs Lungs: CTA bilaterally, no wheezes, rhonchi, or rales Abdomen: +bs, soft, abdominal surgical scar, non tender, non distended, no masses, no hepatomegaly, no splenomegaly Musculoskeletal: nontender, no swelling, no obvious deformity Extremities: no edema, no cyanosis, no clubbing Pulses: 2+ symmetric, upper and lower extremities, normal cap refill Neurological: alert, oriented x 3, CN2-12 intact, strength normal upper extremities and lower extremities, sensation normal throughout, DTRs 2+ throughout, no cerebellar signs, gait normal Psychiatric: normal affect, behavior normal, pleasant  Gyn - deferred   Assessment:   Encounter Diagnoses  Name Primary?  . Essential hypertension Yes  . Welcome to Medicare preventive visit   . LBBB (left bundle branch block)   . Mild aortic stenosis by prior echocardiogram   . Allergic rhinitis due to pollen, unspecified seasonality   . Polyp of colon, unspecified part of colon, unspecified type   . Diabetes mellitus with complication (HCC)   . Hypothyroidism, unspecified type   . Bilateral lower extremity edema   . Deficiency anemia   . Elevated LFTs   . Hyperlipidemia with target low density lipoprotein (LDL) cholesterol less than 100 mg/dL   . Impaired cognitive perception pattern   . Morbid obesity (HCC)   . Screening for cancer   . Screening for breast cancer   . Vaccine counseling   . Need for pneumococcal vaccination   . Screen for STD (sexually transmitted disease)   . Post-menopausal   . Estrogen deficiency      Plan:   A preventative services visit was completed today.  During the course of the visit today, we discussed and counseled about appropriate screening and preventive services.  A health risk assessment was established today that included a review of current medications, allergies, social history, family history, medical and preventative health history, biometrics, and preventative screenings to identify potential safety  concerns or impairments.  A personalized plan was printed today for your records and use.   Personalized  health advice and education was given today to reduce health risks and promote self management and wellness.  Information regarding end of life planning was discussed today.  Below are some general recommendations I have for you:  Yearly screenings See your eye doctor yearly for routine vision care. See your dentist yearly for routine dental care including hygiene visits twice yearly. See me here yearly for a routine physical and preventative care visit   Cancer screening Colon cancer screening:   We will refer you AGAIN for screening colonoscopy. We had done this in 01/2017, but not sure what happened.     Cervical cancer screening/ Pap smear - return at your convenience for pap smear.     Breast cancer screening -  Please call to schedule your mammogram AND Bone Density scan at your convenience.  [facility]   Or    [facility]   [phone]          [phone] [address]       [address] [place], [place] [address]        [place], [place] [address]  Osteoporosis screening/Bone Density test - I recommend a bone density test if you are over 59 years old, or under 74 years old if you have risk factors such as a bone fracture as an adult, parent with history of bone fracture as adult, thyroid disease, early menopause, or underweight for example.   Specific Concerns today:  . Shingles vaccine:  I recommend you have a shingles vaccine to help prevent shingles or herpes zoster outbreak.   Please call your insurer to inquire about coverage for the Shingrix vaccine given in 2 doses.   Some insurers cover this vaccine after age 55, some cover this after age 64.  If your insurer covers this, then call to schedule appointment to have this vaccine here. Marland Kitchen Prevnar 13 - pneumococcal vaccine.   I recommend you call insurer about  coverage for the Prevnar 13 vaccine.  . I recommend you work with an attorney to prepare advanced directives such as living will and health care power of attorney   Allergies   Continue flonase nasal spray but I am adding a night time allergy pill called zyrtec  Swelling of legs  Continue Lasix and potassium daily  Diabetes, obesity  I will refer you to the Prep Program at the [facility]  Pending labs, I may change your medications for sugar control and to help with weight loss   Naveah was seen today for medicare wellness.  Diagnoses and all orders for this visit:  Essential hypertension -     Basic metabolic panel  Welcome to Medicare preventive visit -     POCT Urinalysis DIP (Proadvantage Device)  LBBB (left bundle branch block)  Mild aortic stenosis by prior echocardiogram  Allergic rhinitis due to pollen, unspecified seasonality  Polyp of colon, unspecified part of colon, unspecified type  Diabetes mellitus with complication (HCC) -     Hemoglobin A1c  Hypothyroidism, unspecified type -     TSH  Bilateral lower extremity edema  Deficiency anemia  Elevated LFTs  Hyperlipidemia with target low density lipoprotein (LDL) cholesterol less than 100 mg/dL  Impaired cognitive perception pattern  Morbid obesity (HCC)  Screening for cancer  Screening for breast cancer -     MM DIGITAL SCREENING BILATERAL; Future  Vaccine counseling  Need for pneumococcal vaccination  Screen for STD (sexually transmitted disease) -  HIV antibody -     RPR -     GC/Chlamydia Probe Amp  Post-menopausal -     DG Bone Density; Future  Estrogen deficiency -     DG Bone Density; Future    Medicare Attestation A preventative services visit was completed today.  During the course of the visit the patient was educated and counseled about appropriate screening and preventive services.  A health risk assessment was established with the patient that included a review of  current medications, allergies, social history, family history, medical and preventative health history, biometrics, and preventative screenings to identify potential safety concerns or impairments.  A personalized plan was printed today for the patient's records and use.   Personalized health advice and education was given today to reduce health risks and promote self management and wellness.  Information regarding end of life planning was discussed today.  Kristian Covey, PA-C   05/28/2017

## 2017-05-28 NOTE — Patient Instructions (Addendum)
Thanks for trusting [place] with your health care and for coming in for a physical today.  Below are some general recommendations I have for you:  Yearly screenings See your eye doctor yearly for routine vision care. See your dentist yearly for routine dental care including hygiene visits twice yearly. See me here yearly for a routine physical and preventative care visit   Cancer screening Colon cancer screening:   We will refer you AGAIN for screening colonoscopy. We had done this in 01/2017, but not sure what happened.     Cervical cancer screening/ Pap smear - return at your convenience for pap smear.     Breast cancer screening -  Please call to schedule your mammogram AND Bone Density scan at your convenience.  [facility]   Or    [facility]   [phone]          [phone] [address]       [address], [address] [place], [place] [address]        [place], [place] [address]  Osteoporosis screening/Bone Density test - I recommend a bone density test if you are over 73 years old, or under 21 years old if you have risk factors such as a bone fracture as an adult, parent with history of bone fracture as adult, thyroid disease, early menopause, or underweight for example.   Specific Concerns today:  . Shingles vaccine:  I recommend you have a shingles vaccine to help prevent shingles or herpes zoster outbreak.   Please call your insurer to inquire about coverage for the Shingrix vaccine given in 2 doses.   Some insurers cover this vaccine after age 28, some cover this after age 91.  If your insurer covers this, then call to schedule appointment to have this vaccine here. Marland Kitchen Prevnar 13 - pneumococcal vaccine.   I recommend you call insurer about coverage for the Prevnar 13 vaccine.  . I recommend you work with an attorney to prepare advanced directives such as living will and health care power of attorney   Allergies   Continue  flonase nasal spray but I am adding a night time allergy pill called zyrtec  Swelling of legs  Continue Lasix and potassium daily  Diabetes, obesity  I will refer you to the Prep [facility] at the [facility]  Pending labs, I may change your medications for sugar control and to help with weight loss   Please follow up yearly for a physical.    I have included other useful information below for your review.  Preventative Care for Adults - Female      MAINTAIN REGULAR HEALTH EXAMS:  A routine yearly physical is a good way to check in with your primary care provider about your health and preventive screening. It is also an opportunity to share updates about your health and any concerns you have, and receive a thorough all-over exam.   Most health insurance companies pay for at least some preventative services.  Check with your health plan for specific coverages.  WHAT PREVENTATIVE SERVICES DO WOMEN NEED?  Adult women should have their weight and blood pressure checked regularly.   Women age 7 and older should have their cholesterol levels checked regularly.  Women should be screened for cervical cancer with a Pap smear and pelvic exam beginning at either age 66, or 3 years after they become sexually activity.    Breast cancer screening generally begins at age 31 with  a mammogram and breast exam by your primary care provider.    Beginning at age 87 and continuing to age 6, women should be screened for colorectal cancer.  Certain people may need continued testing until age 37.  Updating vaccinations is part of preventative care.  Vaccinations help protect against diseases such as the flu.  Osteoporosis is a disease in which the bones lose minerals and strength as we age. Women ages 43 and over should discuss this with their caregivers, as should women after menopause who have other risk factors.  Lab tests are generally done as part of preventative care to screen for anemia and blood  disorders, to screen for problems with the kidneys and liver, to screen for bladder problems, to check blood sugar, and to check your cholesterol level.  Preventative services generally include counseling about diet, exercise, avoiding tobacco, drugs, excessive alcohol consumption, and sexually transmitted infections.    GENERAL RECOMMENDATIONS FOR GOOD HEALTH:  Healthy diet:  Eat a variety of foods, including fruit, vegetables, animal or vegetable protein, such as meat, fish, chicken, and eggs, or beans, lentils, tofu, and grains, such as rice.  Drink plenty of water daily.  Decrease saturated fat in the diet, avoid lots of red meat, processed foods, sweets, fast foods, and fried foods.  Exercise:  Aerobic exercise helps maintain good heart health. At least 30-40 minutes of moderate-intensity exercise is recommended. For example, a brisk walk that increases your heart rate and breathing. This should be done on most days of the week.   Find a type of exercise or a variety of exercises that you enjoy so that it becomes a part of your daily life.  Examples are running, walking, swimming, water aerobics, and biking.  For motivation and support, explore group exercise such as aerobic class, spin class, Zumba, Yoga,or  martial arts, etc.    Set exercise goals for yourself, such as a certain weight goal, walk or run in a race such as a 5k walk/run.  Speak to your primary care provider about exercise goals.  Disease prevention:  If you smoke or chew tobacco, find out from your caregiver how to quit. It can literally save your life, no matter how long you have been a tobacco user. If you do not use tobacco, never begin.   Maintain a healthy diet and normal weight. Increased weight leads to problems with blood pressure and diabetes.   The Body Mass Index or BMI is a way of measuring how much of your body is fat. Having a BMI above 27 increases the risk of heart disease, diabetes, hypertension,  stroke and other problems related to obesity. Your caregiver can help determine your BMI and based on it develop an exercise and dietary program to help you achieve or maintain this important measurement at a healthful level.  High blood pressure causes heart and blood vessel problems.  Persistent high blood pressure should be treated with medicine if weight loss and exercise do not work.   Fat and cholesterol leaves deposits in your arteries that can block them. This causes heart disease and vessel disease elsewhere in your body.  If your cholesterol is found to be high, or if you have heart disease or certain other medical conditions, then you may need to have your cholesterol monitored frequently and be treated with medication.   Ask if you should have a cardiac stress test if your history suggests this. A stress test is a test done on a treadmill that  looks for heart disease. This test can find disease prior to there being a problem.  Menopause can be associated with physical symptoms and risks. Hormone replacement therapy is available to decrease these. You should talk to your caregiver about whether starting or continuing to take hormones is right for you.   Osteoporosis is a disease in which the bones lose minerals and strength as we age. This can result in serious bone fractures. Risk of osteoporosis can be identified using a bone density scan. Women ages 8365 and over should discuss this with their caregivers, as should women after menopause who have other risk factors. Ask your caregiver whether you should be taking a calcium supplement and Vitamin D, to reduce the rate of osteoporosis.   Avoid drinking alcohol in excess (more than two drinks per day).  Avoid use of street drugs. Do not share needles with anyone. Ask for professional help if you need assistance or instructions on stopping the use of alcohol, cigarettes, and/or drugs.  Brush your teeth twice a day with fluoride toothpaste, and  floss once a day. Good oral hygiene prevents tooth decay and gum disease. The problems can be painful, unattractive, and can cause other health problems. Visit your dentist for a routine oral and dental check up and preventive care every 6-12 months.   Look at your skin regularly.  Use a mirror to look at your back. Notify your caregivers of changes in moles, especially if there are changes in shapes, colors, a size larger than a pencil eraser, an irregular border, or development of new moles.  Safety:  Use seatbelts 100% of the time, whether driving or as a passenger.  Use safety devices such as hearing protection if you work in environments with loud noise or significant background noise.  Use safety glasses when doing any work that could send debris in to the eyes.  Use a helmet if you ride a bike or motorcycle.  Use appropriate safety gear for contact sports.  Talk to your caregiver about gun safety.  Use sunscreen with a SPF (or skin protection factor) of 15 or greater.  Lighter skinned people are at a greater risk of skin cancer. Don't forget to also wear sunglasses in order to protect your eyes from too much damaging sunlight. Damaging sunlight can accelerate cataract formation.   Practice safe sex. Use condoms. Condoms are used for birth control and to help reduce the spread of sexually transmitted infections (or STIs).  Some of the STIs are gonorrhea (the clap), chlamydia, syphilis, trichomonas, herpes, HPV (human papilloma virus) and HIV (human immunodeficiency virus) which causes AIDS. The herpes, HIV and HPV are viral illnesses that have no cure. These can result in disability, cancer and death.   Keep carbon monoxide and smoke detectors in your home functioning at all times. Change the batteries every 6 months or use a model that plugs into the wall.   Vaccinations:  Stay up to date with your tetanus shots and other required immunizations. You should have a booster for tetanus every 10  years. Be sure to get your flu shot every year, since 5%-20% of the [place] population comes down with the flu. The flu vaccine changes each year, so being vaccinated once is not enough. Get your shot in the fall, before the flu season peaks.   Other vaccines to consider:  Human Papilloma Virus or HPV causes cancer of the cervix, and other infections that can be transmitted from person to person. There is a  vaccine for HPV, and females should get immunized between the ages of 24 and 64. It requires a series of 3 shots.   Pneumococcal vaccine to protect against certain types of pneumonia.  This is normally recommended for adults age 41 or older.  However, adults younger than 65 years old with certain underlying conditions such as diabetes, heart or lung disease should also receive the vaccine.  Shingles vaccine to protect against Varicella Zoster if you are older than age 58, or younger than 65 years old with certain underlying illness.  If you have not had the Shingrix vaccine, please call your insurer to inquire about coverage for the Shingrix vaccine given in 2 doses.   Some insurers cover this vaccine after age 53, some cover this after age 67.  If your insurer covers this, then call to schedule appointment to have this vaccine here  Hepatitis A vaccine to protect against a form of infection of the liver by a virus acquired from food.  Hepatitis B vaccine to protect against a form of infection of the liver by a virus acquired from blood or body fluids, particularly if you work in health care.  If you plan to travel internationally, check with your local health department for specific vaccination recommendations.  Cancer Screening:  Breast cancer screening is essential to preventive care for women. All women age 68 and older should perform a breast self-exam every month. At age 2 and older, women should have their caregiver complete a breast exam each year. Women at ages 24 and older should have a  mammogram (x-ray film) of the breasts. Your caregiver can discuss how often you need mammograms.    Cervical cancer screening includes taking a Pap smear (sample of cells examined under a microscope) from the cervix (end of the uterus). It also includes testing for HPV (Human Papilloma Virus, which can cause cervical cancer). Screening and a pelvic exam should begin at age 28, or 3 years after a woman becomes sexually active. Screening should occur every year, with a Pap smear but no HPV testing, up to age 27. After age 63, you should have a Pap smear every 3 years with HPV testing, if no HPV was found previously.   Most routine colon cancer screening begins at the age of 56. On a yearly basis, doctors may provide special easy to use take-home tests to check for hidden blood in the stool. Sigmoidoscopy or colonoscopy can detect the earliest forms of colon cancer and is life saving. These tests use a small camera at the end of a tube to directly examine the colon. Speak to your caregiver about this at age 8, when routine screening begins (and is repeated every 5 years unless early forms of pre-cancerous polyps or small growths are found).      Advance Directive Advance directives are legal documents that let you make choices ahead of time about your health care and medical treatment in case you become unable to communicate for yourself. Advance directives are a way for you to communicate your wishes to family, friends, and health care providers. This can help convey your decisions about end-of-life care if you become unable to communicate. Discussing and writing advance directives should happen over time rather than all at once. Advance directives can be changed depending on your situation and what you want, even after you have signed the advance directives. If you do not have an advance directive, some states assign family decision makers to act on your behalf  based on how closely you are related to  them. Each state has its own laws regarding advance directives. You may want to check with your health care provider, attorney, or state representative about the laws in your state. There are different types of advance directives, such as:  Medical power of attorney.  Living will.  Do not resuscitate (DNR) or do not attempt resuscitation (DNAR) order.  Health care proxy and medical power of attorney A health care proxy, also called a health care agent, is a person who is appointed to make medical decisions for you in cases in which you are unable to make the decisions yourself. Generally, people choose someone they know well and trust to represent their preferences. Make sure to ask this person for an agreement to act as your proxy. A proxy may have to exercise judgment in the event of a medical decision for which your wishes are not known. A medical power of attorney is a legal document that names your health care proxy. Depending on the laws in your state, after the document is written, it may also need to be:  Signed.  Notarized.  Dated.  Copied.  Witnessed.  Incorporated into your medical record.  You may also want to appoint someone to manage your financial affairs in a situation in which you are unable to do so. This is called a durable power of attorney for finances. It is a separate legal document from the durable power of attorney for health care. You may choose the same person or someone different from your health care proxy to act as your agent in financial matters. If you do not appoint a proxy, or if there is a concern that the proxy is not acting in your best interests, a court-appointed guardian may be designated to act on your behalf. Living will A living will is a set of instructions documenting your wishes about medical care when you cannot express them yourself. Health care providers should keep a copy of your living will in your medical record. You may want to give a  copy to family members or friends. To alert caregivers in case of an emergency, you can place a card in your wallet to let them know that you have a living will and where they can find it. A living will is used if you become:  Terminally ill.  Incapacitated.  Unable to communicate or make decisions.  Items to consider in your living will include:  The use or non-use of life-sustaining equipment, such as dialysis machines and breathing machines (ventilators).  A DNR or DNAR order, which is the instruction not to use cardiopulmonary resuscitation (CPR) if breathing or heartbeat stops.  The use or non-use of tube feeding.  Withholding of food and fluids.  Comfort (palliative) care when the goal becomes comfort rather than a cure.  Organ and tissue donation.  A living will does not give instructions for distributing your money and property if you should pass away. It is recommended that you seek the advice of a lawyer when writing a will. Decisions about taxes, beneficiaries, and asset distribution will be legally binding. This process can relieve your family and friends of any concerns surrounding disputes or questions that may come up about the distribution of your assets. DNR or DNAR A DNR or DNAR order is a request not to have CPR in the event that your heart stops beating or you stop breathing. If a DNR or DNAR order has not been made  and shared, a health care provider will try to help any patient whose heart has stopped or who has stopped breathing. If you plan to have surgery, talk with your health care provider about how your DNR or DNAR order will be followed if problems occur. Summary  Advance directives are the legal documents that allow you to make choices ahead of time about your health care and medical treatment in case you become unable to communicate for yourself.  The process of discussing and writing advance directives should happen over time. You can change the advance  directives, even after you have signed them.  Advance directives include DNR or DNAR orders, living wills, and designating an agent as your medical power of attorney. This information is not intended to replace advice given to you by your health care provider. Make sure you discuss any questions you have with your health care provider. Document Released: 05/19/2007 Document Revised: 12/30/2015 Document Reviewed: 12/30/2015 Elsevier Interactive Patient Education  2017 [employer].

## 2017-05-29 LAB — BASIC METABOLIC PANEL
BUN/Creatinine Ratio: 9 — ABNORMAL LOW (ref 12–28)
BUN: 9 mg/dL (ref 8–27)
CO2: 25 mmol/L (ref 20–29)
Calcium: 9.7 mg/dL (ref 8.7–10.3)
Chloride: 102 mmol/L (ref 96–106)
Creatinine, Ser: 0.95 mg/dL (ref 0.57–1.00)
GFR calc Af Amer: 73 mL/min/{1.73_m2} (ref >59)
GFR calc non Af Amer: 63 mL/min/{1.73_m2} (ref >59)
Glucose: 88 mg/dL (ref 65–99)
Potassium: 4.6 mmol/L (ref 3.5–5.2)
Sodium: 142 mmol/L (ref 134–144)

## 2017-05-29 LAB — RPR: RPR Ser Ql: NONREACTIVE

## 2017-05-29 LAB — HEMOGLOBIN A1C
Est. average glucose Bld gHb Est-mCnc: 137 mg/dL
Hgb A1c MFr Bld: 6.4 % — ABNORMAL HIGH (ref 4.8–5.6)

## 2017-05-29 LAB — HIV ANTIBODY (ROUTINE TESTING W REFLEX): HIV Screen 4th Generation wRfx: NONREACTIVE

## 2017-05-29 LAB — TSH: TSH: 4.88 u[IU]/mL — ABNORMAL HIGH (ref 0.450–4.500)

## 2017-05-30 ENCOUNTER — Telehealth: Payer: Self-pay | Admitting: Medical

## 2017-05-30 NOTE — Telephone Encounter (Signed)
P.A. TRULICITY 

## 2017-05-31 ENCOUNTER — Other Ambulatory Visit: Payer: Self-pay | Admitting: Medical

## 2017-05-31 ENCOUNTER — Encounter: Payer: Self-pay | Admitting: Medical

## 2017-05-31 ENCOUNTER — Telehealth: Payer: Self-pay | Admitting: Medical

## 2017-05-31 ENCOUNTER — Other Ambulatory Visit (HOSPITAL_COMMUNITY)
Admission: RE | Admit: 2017-05-31 | Discharge: 2017-05-31 | Disposition: A | Discharge status: Home / Self Care | Payer: 59 | Source: Ambulatory Visit | Attending: Family Medicine | Admitting: Family Medicine

## 2017-05-31 ENCOUNTER — Ambulatory Visit (INDEPENDENT_AMBULATORY_CARE_PROVIDER_SITE_OTHER): Payer: 59 | Admitting: Medical

## 2017-05-31 VITALS — BP 124/82 | HR 61 | Ht 67.5 in | Wt 260.4 lb

## 2017-05-31 DIAGNOSIS — Z124 Encounter for screening for malignant neoplasm of cervix: Secondary | ICD-10-CM | POA: Diagnosis present

## 2017-05-31 DIAGNOSIS — A5901 Trichomonal vulvovaginitis: Secondary | ICD-10-CM | POA: Insufficient documentation

## 2017-05-31 DIAGNOSIS — E2839 Other primary ovarian failure: Secondary | ICD-10-CM | POA: Diagnosis not present

## 2017-05-31 DIAGNOSIS — E118 Type 2 diabetes mellitus with unspecified complications: Secondary | ICD-10-CM

## 2017-05-31 DIAGNOSIS — Z1231 Encounter for screening mammogram for malignant neoplasm of breast: Secondary | ICD-10-CM | POA: Diagnosis not present

## 2017-05-31 DIAGNOSIS — E039 Hypothyroidism, unspecified: Secondary | ICD-10-CM | POA: Diagnosis not present

## 2017-05-31 DIAGNOSIS — Z780 Asymptomatic menopausal state: Secondary | ICD-10-CM

## 2017-05-31 DIAGNOSIS — Z1239 Encounter for other screening for malignant neoplasm of breast: Secondary | ICD-10-CM

## 2017-05-31 MED ORDER — SEMAGLUTIDE (1 MG/DOSE) 2 MG/1.5ML ~~LOC~~ SOPN: 1.0000 mg | PEN_INJECTOR | SUBCUTANEOUS | 3 refills | Status: DC

## 2017-05-31 MED ORDER — FUROSEMIDE 20 MG PO TABS: 20.0000 mg | ORAL_TABLET | Freq: Every day | ORAL | 3 refills | Status: DC

## 2017-05-31 MED ORDER — ATORVASTATIN CALCIUM 20 MG PO TABS: 20.0000 mg | ORAL_TABLET | Freq: Every day | ORAL | 3 refills | Status: DC

## 2017-05-31 MED ORDER — AZITHROMYCIN 250 MG PO TABS: ORAL_TABLET | ORAL | 0 refills | Status: DC

## 2017-05-31 MED ORDER — CETIRIZINE HCL 10 MG PO TABS: 10.0000 mg | ORAL_TABLET | Freq: Every day | ORAL | 3 refills | Status: DC

## 2017-05-31 MED ORDER — POTASSIUM CHLORIDE CRYS ER 10 MEQ PO TBCR: 10.0000 meq | EXTENDED_RELEASE_TABLET | Freq: Every day | ORAL | 3 refills | Status: DC

## 2017-05-31 MED ORDER — LEVOTHYROXINE SODIUM 75 MCG PO TABS: 75.0000 ug | ORAL_TABLET | Freq: Every day | ORAL | 1 refills | Status: DC

## 2017-05-31 MED ORDER — LISINOPRIL-HYDROCHLOROTHIAZIDE 20-12.5 MG PO TABS: 1.0000 | ORAL_TABLET | Freq: Every day | ORAL | 3 refills | Status: DC

## 2017-05-31 NOTE — Telephone Encounter (Signed)
Call Rose Fillersebbie Kinney RN at [facility].  I want to refer this patient to her for the [facility](PREP) Provider Referral Exercise Program.  [facility]  [phone]  [address] [place], [place] [id number]

## 2017-05-31 NOTE — Addendum Note (Signed)
Addended by: Jac CanavanYSINGER, Ellanor Feuerstein S on: 05/31/2017 02:32 PM   Modules accepted: Orders

## 2017-05-31 NOTE — Telephone Encounter (Signed)
Called Debbie, LVM to call back so we could refer patient to her, and get what information she would need. Left call back number.

## 2017-05-31 NOTE — Progress Notes (Addendum)
Subjective: Chief Complaint  Patient presents with  . Pap and Breast Exam    no concerns   Here for pap and breast exam.  She was here Friday for wellness visit but was late so we had limited time.    She has no breast or pelvic concerns.  Here to review lab from Friday as well.  Past Medical History:  Diagnosis Date  . Anemia   . Aortic stenosis    Echo 04/2008:  EF 60%, mean AV gradient 7 mmHg.   . Diabetes mellitus   . Diverticula of colon    per colonoscopy6/20/11  . Edema of lower extremity    wears ted hose  . H/O echocardiogram 2014   EF 65%, slight gradient of aortic valve, no major findings otherwise  . Hx of cardiovascular stress test    Adenosine Myoview 04/2008:  Normal.  . Hyperlipidemia   . Hypertension   . Hypothyroidism   . LBBB (left bundle branch block)   . Obesity   . 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 Tubulovillous adenoma   . Wears dentures    upper  . Wears glasses    Past Surgical History:  Procedure Laterality Date  . CARPAL TUNNEL RELEASE     bilat  . COLECTOMY  08/13/09   laparoscopic converted to open transverse colectomy w/ side to side anastomosis  . COLONOSCOPY  09/01/10   Dr. Charna Elizabeth; polyp, tubular adenoma; repeat 3 years  . LASIK    . TRANSTHORACIC ECHOCARDIOGRAM  11/2012; 06/2016   a. EF~65%. Mild ES. Mean gradient 13 mmHg. Peak 20 mmHg;; b. Normal LV size and function.  EF 60-65%.  Possible mild LVOT gradient.  Marland Kitchen VEIN SURGERY  2010   right leg   ROS as in subjective    Objective: BP 124/82 (BP Location: Right Arm, Patient Position: Sitting, Cuff Size: Normal)   Pulse 61   Ht 5' 7.5" (1.715 m)   Wt 260 lb 6.4 oz (118.1 kg)   SpO2 97%   BMI 40.18 kg/m    Breast: non tender, no masses or lumps, no skin changes, no nipple discharge or inversion, no axillary lymphadenopathy Gyn: Normal external genitalia without lesions, exam difficult given body habitus and obesity, vagina with  normal mucosa, cervix without lesions, no cervical motion tenderness, no abnormal vaginal discharge.  Uterus and adnexa not enlarged, non tender, no masses.  Pap performed.  Exam chaperoned by nurse. Rectal: deferred     Assessment: Encounter Diagnoses  Name Primary?  . Hypothyroidism, unspecified type Yes  . Morbid obesity (HCC)   . Diabetes mellitus with complication (HCC)   . Screening for breast cancer   . Post-menopausal   . Estrogen deficiency   . Screening for cervical cancer     Plan: Hypothyroidism   Your labs show thyroid was under corrected  Increase to Synthroid daily 45-60 minutes before breakfast on an empty stomach  Plan to recheck lab with nurse in 6 weeks  Diabetes and obesity  STOP Trulicity  Begin Ozempic weekly injection  This should be very similar to what you are already doing, but let me know if you have any problems with this  continue to monitor glucose   Work on diet and exercise changes to help lose weight.   I have put some recommendations in the handout below  Call and schedule your mammogram and bone density tests at [facility]  We will call with pap  smear results  Daliah was seen today for pap and breast exam.  Diagnoses and all orders for this visit:  Hypothyroidism, unspecified type -     TSH; Future  Morbid obesity (HCC)  Diabetes mellitus with complication (HCC)  Screening for breast cancer  Post-menopausal  Estrogen deficiency  Screening for cervical cancer -     Cytology - PAP

## 2017-05-31 NOTE — Patient Instructions (Signed)
Hypothyroidism   Your labs show thyroid was under corrected  Increase to Synthroid 75mcg daily 45-60 minutes before breakfast on an empty stomach  Plan to recheck lab with nurse in 6 weeks  Diabetes and obesity  STOP Trulicity  Begin Ozempic weekly injection  This should be very similar to what you are already doing, but let me know if you have any problems with this  continue to monitor glucose   Work on diet and exercise changes to help lose weight.   I have put some recommendations in the handout below  Call and schedule [facility]  [facility]  [phone] [address], [address] [place], [place] [id number]  We will call with pap smear results  I am going to call [employer] exercise program for you.  I recommend exercising most days of the week using a type of exercise that they would enjoy and stick to such as walking, running, swimming, hiking, biking, aerobics, etc.  I recommend a healthy diet.    Do's:   whole grains such as whole grain pasta, rice, whole grains breads and whole grain cereals.  Use small quantities such as 1/2 cup per serving or 2 slices of bread per serving.    Eat 3-5 fruits daily  Eat beans at least once daily  Eat almonds in small quantities at least 3 days per week    If they eat meat, I recommend small portions of lean meats such as chicken, fish, and [place].  Eat as much NON corn and NON potato vegetables as they like, particularly raw or steamed  Drink several large glasses of water daily  Cautions:  Limit red meat  Limit corn and potatoes  Limit sweets, cake, pie, candy  Limit beer and alcohol  Avoid fried food, fast food, large portions  Avoid sugary drinks such as regular soda and sweet tea

## 2017-06-01 ENCOUNTER — Telehealth: Payer: Self-pay | Admitting: Internal Medicine

## 2017-06-01 LAB — GC/CHLAMYDIA PROBE AMP
Chlamydia trachomatis, NAA: NEGATIVE
Neisseria gonorrhoeae by PCR: NEGATIVE

## 2017-06-01 NOTE — Telephone Encounter (Signed)
Received records from [facility]

## 2017-06-02 ENCOUNTER — Telehealth: Payer: Self-pay

## 2017-06-02 ENCOUNTER — Other Ambulatory Visit: Payer: Self-pay | Admitting: Medical

## 2017-06-02 LAB — CYTOLOGY - PAP: Diagnosis: NEGATIVE

## 2017-06-02 MED ORDER — METRONIDAZOLE 500 MG PO TABS: 500.0000 mg | ORAL_TABLET | Freq: Three times a day (TID) | ORAL | 0 refills | Status: AC

## 2017-06-02 NOTE — Telephone Encounter (Signed)
-----   Message from Jac Canavanavid S Tysinger, PA-C sent at 06/02/2017  6:54 AM EDT ----- Chlamydia and gonorrhea negative

## 2017-06-02 NOTE — Telephone Encounter (Signed)
Called patient, LVM to call back to discuss lab work. Left call back number.  

## 2017-06-02 NOTE — Telephone Encounter (Signed)
Called patient and gave lab results. Patient had no questions or concerns.  

## 2017-06-07 ENCOUNTER — Other Ambulatory Visit: Payer: Self-pay

## 2017-06-07 DIAGNOSIS — Z1211 Encounter for screening for malignant neoplasm of colon: Secondary | ICD-10-CM

## 2017-06-08 NOTE — Telephone Encounter (Signed)
Attempted to contact Kathryn [name], LVM advising her to call back to discuss how to proceed with referral.

## 2017-06-09 NOTE — Telephone Encounter (Signed)
Information sent to Rose Fillersebbie Kinney via email. She stated she would contact patient and set up a time.

## 2017-06-10 ENCOUNTER — Encounter: Payer: Self-pay | Admitting: Internal Medicine

## 2017-06-20 NOTE — Telephone Encounter (Signed)
Pt was changed from Trulicity to [employer]

## 2017-06-22 NOTE — Telephone Encounter (Signed)
Called pharmacy to make sure Ozempic went thru and they had no record of Ozempic, so I called that into pharmacy and went thru for $3.  Called pt and left message

## 2017-07-12 ENCOUNTER — Telehealth: Payer: Self-pay | Admitting: *Deleted

## 2017-07-12 ENCOUNTER — Other Ambulatory Visit: Payer: Self-pay | Admitting: *Deleted

## 2017-07-12 ENCOUNTER — Inpatient Hospital Stay: Admission: RE | Admit: 2017-07-12 | Payer: Medicare Other | Source: Ambulatory Visit

## 2017-07-12 ENCOUNTER — Encounter: Payer: Self-pay | Admitting: Medical

## 2017-07-12 ENCOUNTER — Ambulatory Visit (INDEPENDENT_AMBULATORY_CARE_PROVIDER_SITE_OTHER): Payer: 59 | Admitting: Medical

## 2017-07-12 ENCOUNTER — Other Ambulatory Visit: Payer: 59

## 2017-07-12 VITALS — BP 132/80 | HR 65 | Temp 98.0°F | Ht 68.0 in | Wt 248.2 lb

## 2017-07-12 DIAGNOSIS — E2839 Other primary ovarian failure: Secondary | ICD-10-CM

## 2017-07-12 DIAGNOSIS — I350 Nonrheumatic aortic (valve) stenosis: Secondary | ICD-10-CM

## 2017-07-12 DIAGNOSIS — I8393 Asymptomatic varicose veins of bilateral lower extremities: Secondary | ICD-10-CM | POA: Diagnosis not present

## 2017-07-12 DIAGNOSIS — E785 Hyperlipidemia, unspecified: Secondary | ICD-10-CM | POA: Diagnosis not present

## 2017-07-12 DIAGNOSIS — J301 Allergic rhinitis due to pollen: Secondary | ICD-10-CM | POA: Diagnosis not present

## 2017-07-12 DIAGNOSIS — I447 Left bundle-branch block, unspecified: Secondary | ICD-10-CM | POA: Diagnosis not present

## 2017-07-12 DIAGNOSIS — Z780 Asymptomatic menopausal state: Secondary | ICD-10-CM

## 2017-07-12 DIAGNOSIS — I872 Venous insufficiency (chronic) (peripheral): Secondary | ICD-10-CM | POA: Diagnosis not present

## 2017-07-12 DIAGNOSIS — E039 Hypothyroidism, unspecified: Secondary | ICD-10-CM

## 2017-07-12 DIAGNOSIS — J029 Acute pharyngitis, unspecified: Secondary | ICD-10-CM

## 2017-07-12 DIAGNOSIS — E118 Type 2 diabetes mellitus with unspecified complications: Secondary | ICD-10-CM | POA: Diagnosis not present

## 2017-07-12 DIAGNOSIS — R600 Localized edema: Secondary | ICD-10-CM

## 2017-07-12 DIAGNOSIS — I10 Essential (primary) hypertension: Secondary | ICD-10-CM

## 2017-07-12 MED ORDER — POTASSIUM CHLORIDE CRYS ER 10 MEQ PO TBCR: 10.0000 meq | EXTENDED_RELEASE_TABLET | Freq: Every day | ORAL | 3 refills | Status: DC

## 2017-07-12 MED ORDER — EXENATIDE ER 2 MG/0.85ML ~~LOC~~ AUIJ: 2.0000 mg | AUTO-INJECTOR | SUBCUTANEOUS | 5 refills | Status: DC

## 2017-07-12 MED ORDER — CETIRIZINE HCL 10 MG PO TABS: 10.0000 mg | ORAL_TABLET | Freq: Every day | ORAL | 3 refills | Status: DC

## 2017-07-12 MED ORDER — AMOXICILLIN 875 MG PO TABS: 875.0000 mg | ORAL_TABLET | Freq: Two times a day (BID) | ORAL | 0 refills | Status: DC

## 2017-07-12 NOTE — Progress Notes (Signed)
Subjective: Chief Complaint  Patient presents with  . Medication Management   Here for medication recheck.  Medical team: Dr. Bryan Lemma, cardiology Tysinger, Kermit Balo, PA-C here for primary care  She completed the course of metronidazole for trichomonas infection.  She did tell her partner who she is no longer with.   Advised they go on medication for this as we asked  She is compliant with Synthroid .  We increased the dose last visit.    Diabetes - ozempic is not covered by insurance.    We had tried to change from Trulicity last visit to Ozempic.    Wants antibiotic for recurrent throat pain.   Currently out of there zyrtec, having some allergy problems of late, with sneezing, itchy eyes.    Past Medical History:  Diagnosis Date  . Anemia   . Aortic stenosis    Echo 04/2008:  EF 60%, mean AV gradient 7 mmHg.   . Diabetes mellitus   . Diverticula of colon    per colonoscopy6/20/11  . Edema of lower extremity    wears ted hose  . H/O echocardiogram 2014   EF 65%, slight gradient of aortic valve, no major findings otherwise  . Hx of cardiovascular stress test    Adenosine Myoview 04/2008:  Normal.  . Hyperlipidemia   . Hypertension   . Hypothyroidism   . LBBB (left bundle branch block)   . Obesity   . 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 Tubulovillous adenoma   . Wears dentures    upper  . Wears glasses    Current Outpatient Medications on File Prior to Visit  Medication Sig Dispense Refill  . atorvastatin (LIPITOR) 20 MG tablet Take 1 tablet (20 mg total) by mouth daily. 90 tablet 3  . furosemide (LASIX) 20 MG tablet Take 1 tablet (20 mg total) by mouth daily. 90 tablet 3  . levothyroxine (SYNTHROID) 75 MCG tablet Take 1 tablet (75 mcg total) by mouth daily. 90 tablet 1  . lisinopril-hydrochlorothiazide (PRINZIDE,ZESTORETIC) 20-12.5 MG tablet Take 1 tablet by mouth daily. 90 tablet 3   No current facility-administered  medications on file prior to visit.    ROS as in subjective    Objective: BP 132/80   Pulse 65   Temp 98 F (36.7 C) (Oral)   Ht  (1.727 m)   Wt 248 lb 3.2 oz (112.6 kg)   SpO2 98%   BMI 37.74 kg/m   BP Readings from Last 3 Encounters:  07/12/17 132/80  05/31/17 124/82  05/28/17 124/82   Wt Readings from Last 3 Encounters:  07/12/17 248 lb 3.2 oz (112.6 kg)  05/31/17 260 lb 6.4 oz (118.1 kg)  05/28/17 263 lb 6.4 oz (119.5 kg)   General appearance: alert, no distress, WD/WN,  HEENT: normocephalic, sclerae anicteric, TMs pearly, nares patent, no discharge or erythema, pharynx normal Oral cavity: MMM, no lesions Neck: supple, no lymphadenopathy, no thyromegaly, no masses Heart: 2/6 faint systolic brief murmur heard best in upper sternal border, otherwise RRR, normal S1, S2 Lungs: CTA bilaterally, no wheezes, rhonchi, or rales Pulses: 2+ symmetric, upper and lower extremities, normal cap refill Ext: no edema     Assessment: Encounter Diagnoses  Name Primary?  . Essential hypertension Yes  . Mild aortic stenosis by prior echocardiogram   . LBBB (left bundle branch block)   . Allergic rhinitis due to pollen, unspecified seasonality   . Diabetes mellitus with complication (HCC)   .  Hypothyroidism, unspecified type   . Hyperlipidemia with target low density lipoprotein (LDL) cholesterol less than 100 mg/dL   . Morbid obesity (HCC)   . Estrogen deficiency   . Post-menopausal   . Varicose veins of both lower extremities, unspecified whether complicated   . Chronic venous insufficiency   . Pharyngitis, unspecified etiology      Plan: Discussed her concerns and recommendations  Diabetes  Continue to check your sugars fasting in the morning before breakfast.  The goal should be less than 130 blood sugar  Since her insurance would not cover Ozempic, we will change to once weekly Bydureon/B-cise pen device  High blood pressure  Continue to take lisinopril  HCT 20/12.5 mg daily in the morning  Swelling in the legs  Continue to take Lasix 20 mg daily in the morning  Take Klor-Con potassium chloride 10 mEq daily along with the Lasix  Allergies  Go pick up the cetirizine/Zyrtec 10 mg tablet to take daily at bedtime for allergies  Hypothyroidism  Continue take your thyroid medicine Synthroid/levothyroxine 75 mcg daily in the morning on an empty stomach about 45 minutes before breakfast  High cholesterol  Continue to take Lipitor/atorvastatin 20 mg daily at bedtime  Call to schedule your bone density test  We will call to get a copy of your mammogram that you had recently  I reviewed the vascular clinic records that came in regarding her hx/o varicose veins and chronic venous insuffficiency  Pharyngitis - begin round of antiobiotic as below   Laresha was seen today for medication management.  Diagnoses and all orders for this visit:  Essential hypertension  Mild aortic stenosis by prior echocardiogram  LBBB (left bundle branch block)  Allergic rhinitis due to pollen, unspecified seasonality  Diabetes mellitus with complication (HCC)  Hypothyroidism, unspecified type -     TSH -     T4, free  Hyperlipidemia with target low density lipoprotein (LDL) cholesterol less than 100 mg/dL  Morbid obesity (HCC)  Estrogen deficiency  Post-menopausal  Varicose veins of both lower extremities, unspecified whether complicated  Chronic venous insufficiency  Pharyngitis, unspecified etiology  Other orders -     Exenatide ER (BYDUREON BCISE) 2 MG/0.85ML AUIJ; Inject 2 mg into the skin once a week. -     potassium chloride (K-DUR,KLOR-CON) 10 MEQ tablet; Take 1 tablet (10 mEq total) by mouth daily. -     cetirizine (ZYRTEC) 10 MG tablet; Take 1 tablet (10 mg total) by mouth at bedtime. -     amoxicillin (AMOXIL) 875 MG tablet; Take 1 tablet (875 mg total) by mouth 2 (two) times daily.

## 2017-07-12 NOTE — Telephone Encounter (Signed)
-----   Message from Tobin Chad, RN sent at 01/27/2017  9:56 AM EST ----- LABS  CMP , LIPID MAIL 06/27/17 DUE 07/28/17--

## 2017-07-12 NOTE — Telephone Encounter (Signed)
LETTER AND LABSLIP MAILED  

## 2017-07-12 NOTE — Patient Instructions (Addendum)
  Diabetes  Continue to check your sugars fasting in the morning before breakfast.  The goal should be less than 130 blood sugar  Since her insurance would not cover Ozempic, we will change to once weekly Bydureon/B-cise pen device  High blood pressure  Continue to take lisinopril HCT 20/12.5 mg daily in the morning  Swelling in the legs  Continue to take Lasix 20 mg daily in the morning  Take Klor-Con potassium chloride 10 mEq daily along with the Lasix  Allergies  Go pick up the cetirizine/Zyrtec 10 mg tablet to take daily at bedtime for allergies  Hypothyroidism  Continue take your thyroid medicine Synthroid/levothyroxine 75 mcg daily in the morning on an empty stomach about 45 minutes before breakfast  High cholesterol  Continue to take Lipitor/atorvastatin 20 mg daily at bedtime  Call to schedule your bone density test.    [address], [place], [place] [address] Phone: [phone]  We will call to get a copy of your mammogram that you had recently

## 2017-07-13 ENCOUNTER — Other Ambulatory Visit: Payer: Self-pay | Admitting: Medical

## 2017-07-13 LAB — T4, FREE: Free T4: 1.56 ng/dL (ref 0.82–1.77)

## 2017-07-13 LAB — TSH: TSH: 0.914 u[IU]/mL (ref 0.450–4.500)

## 2017-07-21 ENCOUNTER — Other Ambulatory Visit: Payer: Self-pay | Admitting: Medical

## 2017-07-22 NOTE — Telephone Encounter (Signed)
This was already refilled on 05/31/17

## 2017-08-07 ENCOUNTER — Other Ambulatory Visit: Payer: Self-pay | Admitting: Medical

## 2017-08-10 ENCOUNTER — Encounter: Payer: Self-pay | Admitting: Medical

## 2017-08-17 ENCOUNTER — Telehealth: Payer: Self-pay | Admitting: Medical

## 2017-08-17 NOTE — Telephone Encounter (Signed)
She is on Bydureon a similar medication.  Victoza does work better.  She can call her insurance first to see if Victoza is covered?  Victoza is daily vs Bydureon which is weekly

## 2017-08-17 NOTE — Telephone Encounter (Signed)
  Patient called she wants to know if you thought Victoza would help her She says her current meds are not strong enough and her whole family takes Victoza and they are losing weight  Did advise her that she would need an appointment to discuss that

## 2017-08-18 ENCOUNTER — Telehealth: Payer: Self-pay

## 2017-08-18 ENCOUNTER — Other Ambulatory Visit: Payer: Self-pay | Admitting: Medical

## 2017-08-18 MED ORDER — LIRAGLUTIDE 18 MG/3ML ~~LOC~~ SOPN
1.8000 mg | PEN_INJECTOR | Freq: Every day | SUBCUTANEOUS | 2 refills | Status: DC
Start: 2017-08-18 — End: 2017-10-14

## 2017-08-18 NOTE — Telephone Encounter (Signed)
Patient states that her insurance will pay for Victoza daily.

## 2017-08-18 NOTE — Telephone Encounter (Signed)
Ok, have her change to Victoza then daily injection and Stop Bydureon when she gets the Victoza.   Continue healthy diet, regular exercise and check glucose every morning.    On a different note,  We work with a company that does research studies and medication trials, called [employer].   They are currently doing a study on a medication for a vaccine that helps people prevent C diff infection when on antibiotics  Participants in the study receive compensation, close follow up and monitoring.   I wanted to let them know about this study because she has been on recent antibiotics and I think she would be a good candidate for the study.    See if they have any interest in being part of a study.  If agreeable, pass on his info to [employer] and let me know.  If they desire not to be part of the study, that is fine.   We will continue their current medications and/or treatment as usual.

## 2017-08-18 NOTE — Telephone Encounter (Signed)
Patient notified of change and she is not interested in the [employer] study.

## 2017-08-18 NOTE — Telephone Encounter (Signed)
Patient notified of recommendations and states that she will try to get in touch with her insurance co.

## 2017-08-20 ENCOUNTER — Other Ambulatory Visit: Payer: Self-pay

## 2017-08-20 DIAGNOSIS — E118 Type 2 diabetes mellitus with unspecified complications: Secondary | ICD-10-CM

## 2017-08-20 MED ORDER — PEN NEEDLES 31G X 5 MM MISC: 1.0000 | 2 refills | Status: DC

## 2017-08-25 ENCOUNTER — Telehealth: Payer: Self-pay | Admitting: Medical

## 2017-08-25 NOTE — Telephone Encounter (Signed)
[facility] about duplication in medication.   I had her on Bydureon, but she wanted to change recently to daily Victoza.  However, pharmacy shows she picked up Trulicity on 08/21/17 and Bydureon 08/13/17.  She should only be on ONE of these medications!   Please call her pharmacy to discontinue Bydureon and Trulicity since she wanted to switch to Victoza daily.   Also make sure she knows to ONLY take one of these, Victoza, not the other 2.   If she in fact has 3 different medications at home (Trulicity, AxtellBydureon, and Victoza), don't throw them away.   She could use Trulicity first, finish this out, then Bydureon until that runs out, then change to Victoza.  These 3 medications are all in the same class of medication.  I just want to make sure she knows NOT to use more than one of these at a time.  The pharmacy may have on auto refill which is why we need to D/C Bydureon and Trulicity.

## 2017-08-27 NOTE — Telephone Encounter (Signed)
Pt states that she only has bydureon and Victoza. Pt was advised to use bydureon first and once she runs out then switch to Victoza but do not use both together. (I said this to patient multiple times as she was also trying to talk to someone else in person while speaking to me on phone- pt did state she understood what I was saying though) I have contacted pharmacy and advised them to take bydureon and truility off pt's med list if they had it on there.

## 2017-08-27 NOTE — Telephone Encounter (Signed)
They only had [facility]icity on [facility]

## 2017-09-21 ENCOUNTER — Telehealth: Payer: Self-pay

## 2017-09-21 NOTE — Telephone Encounter (Signed)
Left message for patient to call me back to verify that she is only taking Victoza.

## 2017-09-24 NOTE — Telephone Encounter (Signed)
Patient states that she is only taking Victoza.

## 2017-10-11 ENCOUNTER — Ambulatory Visit: Payer: Self-pay | Admitting: Medical

## 2017-10-13 ENCOUNTER — Ambulatory Visit (INDEPENDENT_AMBULATORY_CARE_PROVIDER_SITE_OTHER): Payer: 59 | Admitting: Medical

## 2017-10-13 VITALS — BP 130/82 | HR 77 | Temp 98.2°F | Resp 16 | Ht 67.0 in | Wt 248.8 lb

## 2017-10-13 DIAGNOSIS — E118 Type 2 diabetes mellitus with unspecified complications: Secondary | ICD-10-CM

## 2017-10-13 LAB — POCT GLYCOSYLATED HEMOGLOBIN (HGB A1C): Hemoglobin A1C: 5.8 % — AB (ref 4.0–5.6)

## 2017-10-13 NOTE — Progress Notes (Signed)
Subjective: Chief Complaint  Patient presents with  . dm check    dm sugar running ok, refills victoza, levothyroxin, lasix,  flu shot,  antibiotics and nasel spray    Here for medication recheck.  Medical team: Dr. Bryan Lemmaavid Harding, cardiology Tysinger, Kermit Baloavid S, PA-C here for primary care  Diabetes-since last visit she called and informed to change to different medication as 1 of her family members were using Victoza and she want to try this instead of what she was taking.  She is compliant with Victoza daily.  She has not checking sugars every day but says her numbers look fine.  She is exercising very little, says she is trying to eat healthy but she does eat some candy.  No foot concerns.  Hyperlipidemia-compliant with Lipitor 20 mg daily without complaint  Edema uses Lasix as needed, compliant with K-Dur  Hypertension-compliant with lisinopril HCTZ 20/12.5 mg daily without complaint.  Not checking blood pressures.  Hypothyroidism-compliant with Synthroid 75 mcg daily  She has ongoing complaints of allergy problems nasal congestion   Past Medical History:  Diagnosis Date  . Anemia   . Aortic stenosis    Echo 04/2008:  EF 60%, mean AV gradient 7 mmHg.   . Diabetes mellitus   . Diverticula of colon    per colonoscopy6/20/11  . Edema of lower extremity    wears ted hose  . H/O echocardiogram 2014   EF 65%, slight gradient of aortic valve, no major findings otherwise  . Hx of cardiovascular stress test    Adenosine Myoview 04/2008:  Normal.  . Hyperlipidemia   . Hypertension   . Hypothyroidism   . LBBB (left bundle branch block)   . Obesity   . 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 Tubulovillous adenoma   . Wears dentures    upper  . Wears glasses    Current Outpatient Medications on File Prior to Visit  Medication Sig Dispense Refill  . atorvastatin (LIPITOR) 20 MG tablet Take 1 tablet (20 mg total) by mouth daily. 90 tablet 3  .  cetirizine (ZYRTEC) 10 MG tablet Take 1 tablet (10 mg total) by mouth at bedtime. 90 tablet 3  . furosemide (LASIX) 20 MG tablet Take 1 tablet (20 mg total) by mouth daily. 90 tablet 3  . lisinopril-hydrochlorothiazide (PRINZIDE,ZESTORETIC) 20-12.5 MG tablet Take 1 tablet by mouth daily. 90 tablet 3  . potassium chloride (K-DUR,KLOR-CON) 10 MEQ tablet Take 1 tablet (10 mEq total) by mouth daily. 90 tablet 3   No current facility-administered medications on file prior to visit.    ROS as in subjective    Objective: BP 130/82   Pulse 77   Temp 98.2 F (36.8 C) (Oral)   Resp 16   Ht 5\' 7"  (1.702 m)   Wt 248 lb 12.8 oz (112.9 kg)   SpO2 98%   BMI 38.97 kg/m   BP Readings from Last 3 Encounters:  10/13/17 130/82  07/12/17 132/80  05/31/17 124/82   Wt Readings from Last 3 Encounters:  10/13/17 248 lb 12.8 oz (112.9 kg)  07/12/17 248 lb 3.2 oz (112.6 kg)  05/31/17 260 lb 6.4 oz (118.1 kg)   General appearance: alert, no distress, WD/WN,  HEENT: normocephalic, sclerae anicteric, TMs pearly, nares patent, turbinated swelling with mild clear discharge, pharynx normal Oral cavity: MMM, no lesions Neck: supple, no lymphadenopathy, no thyromegaly, no masses Heart: 2/6 faint systolic brief murmur heard best in upper sternal border, otherwise RRR,  normal S1, S2 Lungs: CTA bilaterally, no wheezes, rhonchi, or rales Pulses: 2+ symmetric, upper and lower extremities, normal cap refill Ext: no edema     Assessment: Encounter Diagnosis  Name Primary?  . Diabetes mellitus with complication (HCC) Yes     Plan: Discussed her concerns and recommendations  Diabetes  Counseled on glucose monitoring, glad to see she is doing well on Victoza hemoglobin A1c 5.8% today and she has lost weight in recent months   High blood pressure  Continue to take lisinopril HCT 20/12.5 mg daily in the morning  Swelling in the legs  Continue to take Lasix 20 mg daily in the morning as  needed  Take Klor-Con potassium chloride 10 mEq daily along with the Lasix  Allergies  Advised she use her cetirizine/Zyrtec 10 mg tablet to take daily at bedtime for allergies as well as Flonase nasal  Hypothyroidism  Continue take your thyroid medicine Synthroid/levothyroxine 75 mcg daily in the morning on an empty stomach about 45 minutes before breakfast  High cholesterol  Continue to take Lipitor/atorvastatin 20 mg daily at bedtime   Kathryn [name] was seen today for dm check.  Diagnoses and all orders for this visit:  Diabetes mellitus with complication (HCC) -     HgB A1c -     Insulin Pen Needle (PEN NEEDLES) 31G X 5 MM MISC; 1 each by Does not apply route as directed.  Other orders -     levothyroxine (SYNTHROID) 75 MCG tablet; Take 1 tablet (75 mcg total) by mouth daily. -     liraglutide (VICTOZA) 18 MG/3ML SOPN; Inject 0.3 mLs (1.8 mg total) into the skin daily. -     fluticasone (FLONASE) 50 MCG/ACT nasal spray; Place 2 sprays into both nostrils daily.

## 2017-10-14 MED ORDER — LEVOTHYROXINE SODIUM 75 MCG PO TABS: 75.0000 ug | ORAL_TABLET | Freq: Every day | ORAL | 1 refills | Status: DC

## 2017-10-14 MED ORDER — PEN NEEDLES 31G X 5 MM MISC: 1.0000 | 11 refills | Status: DC

## 2017-10-14 MED ORDER — FLUTICASONE PROPIONATE 50 MCG/ACT NA SUSP: 2.0000 | Freq: Every day | NASAL | 6 refills | Status: DC

## 2017-10-14 MED ORDER — LIRAGLUTIDE 18 MG/3ML ~~LOC~~ SOPN: 1.8000 mg | PEN_INJECTOR | Freq: Every day | SUBCUTANEOUS | 5 refills | Status: DC

## 2017-10-14 NOTE — Addendum Note (Signed)
Addended by: Herminio CommonsJOHNSON, Julieanne Hadsall A on: 10/14/2017 08:05 AM   Modules accepted: Orders

## 2018-03-25 ENCOUNTER — Other Ambulatory Visit: Payer: Self-pay | Admitting: Medical

## 2018-03-28 NOTE — Telephone Encounter (Signed)
Left message on voicemail for patient to call back to schedule appointment for CPE 

## 2018-04-28 ENCOUNTER — Other Ambulatory Visit: Payer: Self-pay | Admitting: Medical

## 2018-06-04 ENCOUNTER — Other Ambulatory Visit: Payer: Self-pay | Admitting: Medical

## 2018-06-06 NOTE — Telephone Encounter (Signed)
Pt has an appt tomorrow morning. Will refill then

## 2018-06-07 ENCOUNTER — Encounter: Payer: Self-pay | Admitting: Medical

## 2018-06-07 ENCOUNTER — Other Ambulatory Visit: Payer: Self-pay

## 2018-06-07 ENCOUNTER — Ambulatory Visit (INDEPENDENT_AMBULATORY_CARE_PROVIDER_SITE_OTHER): Payer: 59 | Admitting: Medical

## 2018-06-07 VITALS — Ht 67.0 in | Wt 248.0 lb

## 2018-06-07 DIAGNOSIS — Z23 Encounter for immunization: Secondary | ICD-10-CM

## 2018-06-07 DIAGNOSIS — I350 Nonrheumatic aortic (valve) stenosis: Secondary | ICD-10-CM

## 2018-06-07 DIAGNOSIS — F09 Unspecified mental disorder due to known physiological condition: Secondary | ICD-10-CM

## 2018-06-07 DIAGNOSIS — Z780 Asymptomatic menopausal state: Secondary | ICD-10-CM

## 2018-06-07 DIAGNOSIS — I872 Venous insufficiency (chronic) (peripheral): Secondary | ICD-10-CM

## 2018-06-07 DIAGNOSIS — E118 Type 2 diabetes mellitus with unspecified complications: Secondary | ICD-10-CM

## 2018-06-07 DIAGNOSIS — E039 Hypothyroidism, unspecified: Secondary | ICD-10-CM

## 2018-06-07 DIAGNOSIS — J988 Other specified respiratory disorders: Secondary | ICD-10-CM

## 2018-06-07 DIAGNOSIS — R4189 Other symptoms and signs involving cognitive functions and awareness: Secondary | ICD-10-CM

## 2018-06-07 DIAGNOSIS — R600 Localized edema: Secondary | ICD-10-CM

## 2018-06-07 DIAGNOSIS — R945 Abnormal results of liver function studies: Secondary | ICD-10-CM

## 2018-06-07 DIAGNOSIS — E785 Hyperlipidemia, unspecified: Secondary | ICD-10-CM

## 2018-06-07 DIAGNOSIS — Z7189 Other specified counseling: Secondary | ICD-10-CM

## 2018-06-07 DIAGNOSIS — I447 Left bundle-branch block, unspecified: Secondary | ICD-10-CM | POA: Diagnosis not present

## 2018-06-07 DIAGNOSIS — E2839 Other primary ovarian failure: Secondary | ICD-10-CM

## 2018-06-07 DIAGNOSIS — J301 Allergic rhinitis due to pollen: Secondary | ICD-10-CM

## 2018-06-07 DIAGNOSIS — Z7185 Encounter for immunization safety counseling: Secondary | ICD-10-CM

## 2018-06-07 DIAGNOSIS — I10 Essential (primary) hypertension: Secondary | ICD-10-CM

## 2018-06-07 DIAGNOSIS — I8393 Asymptomatic varicose veins of bilateral lower extremities: Secondary | ICD-10-CM

## 2018-06-07 DIAGNOSIS — K635 Polyp of colon: Secondary | ICD-10-CM

## 2018-06-07 DIAGNOSIS — R7989 Other specified abnormal findings of blood chemistry: Secondary | ICD-10-CM

## 2018-06-07 MED ORDER — FUROSEMIDE 20 MG PO TABS: 20.0000 mg | ORAL_TABLET | Freq: Every day | ORAL | 0 refills | Status: DC

## 2018-06-07 MED ORDER — LEVOTHYROXINE SODIUM 75 MCG PO TABS: 75.0000 ug | ORAL_TABLET | Freq: Every day | ORAL | 0 refills | Status: DC

## 2018-06-07 MED ORDER — POTASSIUM CHLORIDE CRYS ER 10 MEQ PO TBCR: 10.0000 meq | EXTENDED_RELEASE_TABLET | Freq: Every day | ORAL | 0 refills | Status: DC

## 2018-06-07 MED ORDER — FLUTICASONE PROPIONATE 50 MCG/ACT NA SUSP: 2.0000 | Freq: Every day | NASAL | 6 refills | Status: DC

## 2018-06-07 MED ORDER — VITAMIN D 25 MCG (1000 UNIT) PO TABS: 1000.0000 [IU] | ORAL_TABLET | Freq: Every day | ORAL | 3 refills | Status: DC

## 2018-06-07 MED ORDER — CETIRIZINE HCL 10 MG PO TABS: 10.0000 mg | ORAL_TABLET | Freq: Every day | ORAL | 3 refills | Status: DC

## 2018-06-07 MED ORDER — LISINOPRIL-HYDROCHLOROTHIAZIDE 20-12.5 MG PO TABS: 1.0000 | ORAL_TABLET | Freq: Every day | ORAL | 3 refills | Status: DC

## 2018-06-07 MED ORDER — AMOXICILLIN 500 MG PO CAPS: 500.0000 mg | ORAL_CAPSULE | Freq: Three times a day (TID) | ORAL | 0 refills | Status: DC

## 2018-06-07 MED ORDER — LIRAGLUTIDE 18 MG/3ML ~~LOC~~ SOPN: PEN_INJECTOR | SUBCUTANEOUS | 0 refills | Status: DC

## 2018-06-07 MED ORDER — ATORVASTATIN CALCIUM 20 MG PO TABS: 20.0000 mg | ORAL_TABLET | Freq: Every day | ORAL | 3 refills | Status: DC

## 2018-06-07 NOTE — Progress Notes (Signed)
Subjective:     Patient ID: Kathryn [name], female   DOB: [birth year], 68 y.o.   MRN: [id number]  This visit type was conducted due to national recommendations for restrictions regarding the COVID-19 Pandemic (e.g. social distancing) in an effort to limit this patient's exposure and mitigate transmission in our community.  Due to their co-morbid illnesses, this patient is at least at moderate risk for complications without adequate follow up.  This format is felt to be most appropriate for this patient at this time.    Documentation for virtual audio and video telecommunications through Zoom encounter:  The patient was located at home. The provider was located in the office. The patient did consent to this visit and is aware of possible charges through their insurance for this visit.  The other persons participating in this telemedicine service were none. Time spent on call was 30 minutes and in review of previous records >35 minutes total.  This virtual service is not related to other E/M service within previous 7 days.   HPI Chief Complaint  Patient presents with  . med check    med check sugar running normal need refill on meds    Virtual visit today for med check.  Her last visit was August for the same.  She is due at this point for routine labs.  Last hemoglobin A1c in August was 5.8%.  Her medical history includes diabetes, hypertension, hyperlipidemia, obesity, left bundle branch block, mild aortic stenosis, estrogen deficiency, hypothyroidism, chronic venous insufficiency, varicose veins.  Diabetes - taking Victoza daily.  Checking glucose regulalry, seeing good numbers although she couldn't really give me solid numbers, more of vague "normal". Checking feet daily, no concerns.    hyperlipidemia - compliant with Lipitor 20mg  daily  Hypothyroidism - taking levothyroxine 75mcg daily, but takes sometimes with food.  HTN - taking Lisinopril HCT 20/12.5mg  daily  Edema -  taking Lasix 20mg  prn, not daily.   Taking potassium chloride daily though.     Exercise - some walking.    Diet - doesn't eat a lot of junk food.   Eats a lot of vegetables and fruits.  Eats oatmeal and eggs in the morning many days.  Dinner is typically is a sandwich or chicken and beans and rice.    She noted that she checked with insurance and they do cover the shingles vaccine.  She as in prior visits asked for antibiotics to have on hand in case she gets a respiratory infection.  She currently has a mild sore throat but no other symptoms.  She has allergy symptoms this time year and needs a refill of Flonase.  We have discussed this before and she is asked for antibiotics from time to time to have on hand   Past Medical History:  Diagnosis Date  . Anemia   . Aortic stenosis    Echo 04/2008:  EF 60%, mean AV gradient 7 mmHg.   . Diabetes mellitus   . Diverticula of colon    per colonoscopy6/20/11  . Edema of lower extremity    wears ted hose  . H/O echocardiogram 2014   EF 65%, slight gradient of aortic valve, no major findings otherwise  . Hx of cardiovascular stress test    Adenosine Myoview 04/2008:  Normal.  . Hyperlipidemia   . Hypertension   . Hypothyroidism   . LBBB (left bundle branch block)   . Obesity   . 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  Tubulovillous adenoma   . Wears dentures    upper  . Wears glasses    Current Outpatient Medications on File Prior to Visit  Medication Sig Dispense Refill  . Insulin Pen Needle (PEN NEEDLES) 31G X 5 MM MISC 1 each by Does not apply route as directed. 100 each 11   No current facility-administered medications on file prior to visit.     Review of Systems As in subjective    Objective:   Physical Exam  Ht 5\' 7"  (1.702 m)   Wt 248 lb (112.5 kg)   BMI 38.84 kg/m    BP Readings from Last 3 Encounters:  10/13/17 130/82  07/12/17 132/80  05/31/17 124/82    Wt Readings from  Last 3 Encounters:  06/07/18 248 lb (112.5 kg)  10/13/17 248 lb 12.8 oz (112.9 kg)  07/12/17 248 lb 3.2 oz (112.6 kg)   Due to coronavirus pandemic stay at home measures, patient visit was virtual and they were not examined in person.   General: Answers questions appropriately, alert and oriented, no acute distress      Assessment:     Encounter Diagnoses  Name Primary?  . Diabetes mellitus with complication (HCC) Yes  . Essential hypertension   . Chronic venous insufficiency   . LBBB (left bundle branch block)   . Mild aortic stenosis by prior echocardiogram   . Varicose veins of both lower extremities, unspecified whether complicated   . Respiratory tract infection   . Allergic rhinitis due to pollen, unspecified seasonality   . Hypothyroidism, unspecified type   . Bilateral lower extremity edema   . Need for pneumococcal vaccination   . Morbid obesity (HCC)   . Impaired cognitive perception pattern   . Hyperlipidemia with target low density lipoprotein (LDL) cholesterol less than 100 mg/dL   . Estrogen deficiency   . Elevated LFTs   . Polyp of colon, unspecified part of colon, unspecified type   . Post-menopausal   . Vaccine counseling   . Need for shingles vaccine        Plan:     We discussed her chronic health issues, medications, routine follow-up  She will return here tomorrow for fasting labs and to have 2 vaccines, Prevnar 13 and Shingrix No. 1  Diabetes  I looked back through her chart records for many years I believe her initial diagnosis is probably around 2014 when her hemoglobin A1c was 6.7% and she had blood sugar readings within that year that showed 138 at one point fasting however, since then she has done a pretty good job of maintaining good diabetes control  Continue Victoza daily to help with weight management as well as keeping blood sugars under control  We counseled on diet and exercise  Counseled on daily foot checks  Continue glucose  monitoring at home regularly  She will return tomorrow for fasting labs  Hypertension-compliant with lisinopril HCT 20/12.5 mg daily, continue current medication  Hyperlipidemia-continue statin atorvastatin 20 mg, fasting labs tomorrow  Allergic rhinitis-refilled Flonase, continue Zyrtec, continue this and trigger avoidance  Hypothyroidism -continue levothyroxine daily daily, discussed proper use of medication since she was not taking it daily an hour before breakfast on an empty stomach.  She will return for labs tomorrow   Edema- uses Lasix as needed and has not had a problem with edema lately.  We discussed proper use of potassium along with Lasix.  She has actually been taking potassium daily although she has been doing the  Lasix as needed.  Return tomorrow for labs  Vaccine counseling- she has checked insurance on Shingrix and she will return tomorrow for labs as well as 2 vaccines, Shingrix No. 1 and Prevnar 13.  She will need a repeat Shingrix No. 2 booster in 2 months  We discussed her concern for antibiotic.  She has a current mild sore throat which is likely due to allergies.  She was adamant about an antibiotic on file so I sent amoxicillin with instructions to only use it she developed fever or symptoms of sinus infection as we discussed but unfortunately she has a misconception about the role of antibiotics and we may have to come up with some other solution in the future  Korynne was seen today for med check.  Diagnoses and all orders for this visit:  Diabetes mellitus with complication (HCC) -     Comprehensive metabolic panel; Future -     Hemoglobin A1c; Future -     Microalbumin/Creatinine Ratio, Urine; Future  Essential hypertension -     Comprehensive metabolic panel; Future -     Microalbumin/Creatinine Ratio, Urine; Future  Chronic venous insufficiency  LBBB (left bundle branch block)  Mild aortic stenosis by prior echocardiogram  Varicose veins of  both lower extremities, unspecified whether complicated  Respiratory tract infection  Allergic rhinitis due to pollen, unspecified seasonality  Hypothyroidism, unspecified type -     CBC; Future -     TSH; Future  Bilateral lower extremity edema -     CBC; Future  Need for pneumococcal vaccination  Morbid obesity (HCC)  Impaired cognitive perception pattern  Hyperlipidemia with target low density lipoprotein (LDL) cholesterol less than 100 mg/dL -     Comprehensive metabolic panel; Future -     Lipid panel; Future  Estrogen deficiency  Elevated LFTs -     CBC; Future  Polyp of colon, unspecified part of colon, unspecified type  Post-menopausal  Vaccine counseling  Need for shingles vaccine  Other orders -     potassium chloride (K-DUR,KLOR-CON) 10 MEQ tablet; Take 1 tablet (10 mEq total) by mouth daily. -     lisinopril-hydrochlorothiazide (PRINZIDE,ZESTORETIC) 20-12.5 MG tablet; Take 1 tablet by mouth daily. -     liraglutide (VICTOZA) 18 MG/3ML SOPN; INJECT 1.8 MG UNDER THE SKIN ONCE DAILY -     levothyroxine (SYNTHROID, LEVOTHROID) 75 MCG tablet; Take 1 tablet (75 mcg total) by mouth daily. -     furosemide (LASIX) 20 MG tablet; Take 1 tablet (20 mg total) by mouth daily. -     fluticasone (FLONASE) 50 MCG/ACT nasal spray; Place 2 sprays into both nostrils daily. -     cetirizine (ZYRTEC) 10 MG tablet; Take 1 tablet (10 mg total) by mouth at bedtime. -     atorvastatin (LIPITOR) 20 MG tablet; Take 1 tablet (20 mg total) by mouth daily. -     amoxicillin (AMOXIL) 500 MG capsule; Take 1 capsule (500 mg total) by mouth 3 (three) times daily.

## 2018-06-08 ENCOUNTER — Other Ambulatory Visit (INDEPENDENT_AMBULATORY_CARE_PROVIDER_SITE_OTHER): Payer: 59

## 2018-06-08 DIAGNOSIS — R7989 Other specified abnormal findings of blood chemistry: Secondary | ICD-10-CM

## 2018-06-08 DIAGNOSIS — E118 Type 2 diabetes mellitus with unspecified complications: Secondary | ICD-10-CM

## 2018-06-08 DIAGNOSIS — I10 Essential (primary) hypertension: Secondary | ICD-10-CM

## 2018-06-08 DIAGNOSIS — E039 Hypothyroidism, unspecified: Secondary | ICD-10-CM

## 2018-06-08 DIAGNOSIS — R600 Localized edema: Secondary | ICD-10-CM

## 2018-06-08 DIAGNOSIS — Z23 Encounter for immunization: Secondary | ICD-10-CM | POA: Diagnosis not present

## 2018-06-08 DIAGNOSIS — R945 Abnormal results of liver function studies: Secondary | ICD-10-CM

## 2018-06-08 DIAGNOSIS — E785 Hyperlipidemia, unspecified: Secondary | ICD-10-CM

## 2018-06-09 LAB — CBC
Hematocrit: 38.2 % (ref 34.0–46.6)
Hemoglobin: 12.5 g/dL (ref 11.1–15.9)
MCH: 26.5 pg — ABNORMAL LOW (ref 26.6–33.0)
MCHC: 32.7 g/dL (ref 31.5–35.7)
MCV: 81 fL (ref 79–97)
Platelets: 253 10*3/uL (ref 150–450)
RBC: 4.71 x10E6/uL (ref 3.77–5.28)
RDW: 13.3 % (ref 11.7–15.4)
WBC: 5.2 10*3/uL (ref 3.4–10.8)

## 2018-06-09 LAB — LIPID PANEL
Chol/HDL Ratio: 2.9 ratio (ref 0.0–4.4)
Cholesterol, Total: 123 mg/dL (ref 100–199)
HDL: 43 mg/dL (ref >39)
LDL Calculated: 64 mg/dL (ref 0–99)
Triglycerides: 79 mg/dL (ref 0–149)
VLDL Cholesterol Cal: 16 mg/dL (ref 5–40)

## 2018-06-09 LAB — TSH: TSH: 2.74 u[IU]/mL (ref 0.450–4.500)

## 2018-06-09 LAB — HEMOGLOBIN A1C
Est. average glucose Bld gHb Est-mCnc: 123 mg/dL
Hgb A1c MFr Bld: 5.9 % — ABNORMAL HIGH (ref 4.8–5.6)

## 2018-06-09 LAB — COMPREHENSIVE METABOLIC PANEL
ALT: 25 IU/L (ref 0–32)
AST: 22 IU/L (ref 0–40)
Albumin/Globulin Ratio: 1.3 (ref 1.2–2.2)
Albumin: 4 g/dL (ref 3.8–4.8)
Alkaline Phosphatase: 102 IU/L (ref 39–117)
BUN/Creatinine Ratio: 15 (ref 12–28)
BUN: 14 mg/dL (ref 8–27)
Bilirubin Total: 0.3 mg/dL (ref 0.0–1.2)
CO2: 21 mmol/L (ref 20–29)
Calcium: 9.8 mg/dL (ref 8.7–10.3)
Chloride: 104 mmol/L (ref 96–106)
Creatinine, Ser: 0.91 mg/dL (ref 0.57–1.00)
GFR calc Af Amer: 76 mL/min/{1.73_m2} (ref >59)
GFR calc non Af Amer: 66 mL/min/{1.73_m2} (ref >59)
Globulin, Total: 3.2 g/dL (ref 1.5–4.5)
Glucose: 96 mg/dL (ref 65–99)
Potassium: 4.6 mmol/L (ref 3.5–5.2)
Sodium: 141 mmol/L (ref 134–144)
Total Protein: 7.2 g/dL (ref 6.0–8.5)

## 2018-06-09 LAB — MICROALBUMIN / CREATININE URINE RATIO
Creatinine, Urine: 189.4 mg/dL
Microalb/Creat Ratio: 3 mg/g creat (ref 0–29)
Microalbumin, Urine: 5.4 ug/mL

## 2018-06-16 ENCOUNTER — Telehealth: Payer: Self-pay | Admitting: Medical

## 2018-06-16 NOTE — Telephone Encounter (Signed)
Please see prior note regarding Diabetic Eye Exam.

## 2018-06-16 NOTE — Telephone Encounter (Signed)
Received a fax from [facility] in response to a medical records request sent. Per fax no current Diabetic eye exam available. Last exam was prior to 2014.

## 2018-06-20 NOTE — Telephone Encounter (Signed)
Patient states that she has not been to the eye dr in years.  She does go to Dr Dione Booze.   Patient advised to call and schedule appointment for diabetic eye exam asap.

## 2018-06-20 NOTE — Telephone Encounter (Signed)
Call and try to figure out who she is seeing for eye doctor so we can call and get last diabetic eye exam report

## 2018-06-23 ENCOUNTER — Encounter: Payer: Self-pay | Admitting: Medical

## 2018-08-25 ENCOUNTER — Other Ambulatory Visit: Payer: Self-pay | Admitting: Medical

## 2018-10-01 ENCOUNTER — Other Ambulatory Visit: Payer: Self-pay | Admitting: Medical

## 2018-10-17 ENCOUNTER — Telehealth: Payer: Self-pay | Admitting: Medical

## 2018-10-17 NOTE — Telephone Encounter (Signed)
Received requested records from [facility] °

## 2018-10-19 ENCOUNTER — Telehealth: Payer: Self-pay | Admitting: Medical

## 2018-10-19 ENCOUNTER — Other Ambulatory Visit: Payer: Self-pay | Admitting: Medical

## 2018-10-19 NOTE — Telephone Encounter (Signed)
Is this ok to refill?  

## 2018-10-19 NOTE — Telephone Encounter (Signed)
Left message on voicemail for patient to call back to schedule appointment.  

## 2018-10-19 NOTE — Telephone Encounter (Signed)
Schedule physical or med check, whichever is due.  Also, encourage her to schedule mammogram as it hasn't been done, and has she turned in cologard?

## 2018-10-20 ENCOUNTER — Other Ambulatory Visit: Payer: Self-pay | Admitting: Medical

## 2018-10-20 DIAGNOSIS — E118 Type 2 diabetes mellitus with unspecified complications: Secondary | ICD-10-CM

## 2018-10-20 NOTE — Telephone Encounter (Signed)
Called pt and scheduled an appointment for 11/01/2018.

## 2018-11-01 ENCOUNTER — Ambulatory Visit (INDEPENDENT_AMBULATORY_CARE_PROVIDER_SITE_OTHER): Payer: 59 | Admitting: Medical

## 2018-11-01 ENCOUNTER — Encounter: Payer: Self-pay | Admitting: Medical

## 2018-11-01 ENCOUNTER — Other Ambulatory Visit: Payer: Self-pay

## 2018-11-01 VITALS — BP 120/70 | HR 71 | Temp 98.4°F | Ht 67.0 in | Wt 248.2 lb

## 2018-11-01 DIAGNOSIS — I447 Left bundle-branch block, unspecified: Secondary | ICD-10-CM

## 2018-11-01 DIAGNOSIS — I10 Essential (primary) hypertension: Secondary | ICD-10-CM

## 2018-11-01 DIAGNOSIS — Z129 Encounter for screening for malignant neoplasm, site unspecified: Secondary | ICD-10-CM

## 2018-11-01 DIAGNOSIS — Z1239 Encounter for other screening for malignant neoplasm of breast: Secondary | ICD-10-CM

## 2018-11-01 DIAGNOSIS — E785 Hyperlipidemia, unspecified: Secondary | ICD-10-CM

## 2018-11-01 DIAGNOSIS — E039 Hypothyroidism, unspecified: Secondary | ICD-10-CM

## 2018-11-01 DIAGNOSIS — I350 Nonrheumatic aortic (valve) stenosis: Secondary | ICD-10-CM | POA: Diagnosis not present

## 2018-11-01 DIAGNOSIS — K635 Polyp of colon: Secondary | ICD-10-CM

## 2018-11-01 DIAGNOSIS — Z23 Encounter for immunization: Secondary | ICD-10-CM | POA: Diagnosis not present

## 2018-11-01 DIAGNOSIS — I8393 Asymptomatic varicose veins of bilateral lower extremities: Secondary | ICD-10-CM

## 2018-11-01 DIAGNOSIS — Z0000 Encounter for general adult medical examination without abnormal findings: Secondary | ICD-10-CM

## 2018-11-01 DIAGNOSIS — F09 Unspecified mental disorder due to known physiological condition: Secondary | ICD-10-CM

## 2018-11-01 DIAGNOSIS — Z780 Asymptomatic menopausal state: Secondary | ICD-10-CM

## 2018-11-01 DIAGNOSIS — I872 Venous insufficiency (chronic) (peripheral): Secondary | ICD-10-CM

## 2018-11-01 DIAGNOSIS — Z7189 Other specified counseling: Secondary | ICD-10-CM

## 2018-11-01 DIAGNOSIS — E118 Type 2 diabetes mellitus with unspecified complications: Secondary | ICD-10-CM

## 2018-11-01 DIAGNOSIS — R4189 Other symptoms and signs involving cognitive functions and awareness: Secondary | ICD-10-CM

## 2018-11-01 DIAGNOSIS — Z7185 Encounter for immunization safety counseling: Secondary | ICD-10-CM

## 2018-11-01 DIAGNOSIS — E2839 Other primary ovarian failure: Secondary | ICD-10-CM

## 2018-11-01 MED ORDER — LUBRIDERM DAILY MOISTURE EX LOTN: 1.0000 "application " | TOPICAL_LOTION | Freq: Every day | CUTANEOUS | 2 refills | Status: DC

## 2018-11-01 MED ORDER — DOXYCYCLINE HYCLATE 100 MG PO TABS: 100.0000 mg | ORAL_TABLET | Freq: Two times a day (BID) | ORAL | 0 refills | Status: DC

## 2018-11-01 NOTE — Progress Notes (Addendum)
Subjective:    Kathryn [name] is a 68 y.o. female who presents for Preventative Services visit and chronic medical problems/med check visit.    Primary Care Provider Tysinger, Kermit Baloavid S, PA-C here for primary care  Current Health Care Team:  Dentist  Eye doctor  Dr. Charna ElizabethJyothi Mann, GI   Medical Services you may have received from other than [facility] providers in the past year (date may be approximate) none  Exercise Current exercise habits: walking    Nutrition/Diet Current diet: none   Depression Screen [facility] 05/28/2017  Decreased Interest 0  Down, Depressed, Hopeless 0  PHQ - 2 Score 0    Activities of Daily Living Screen/Functional Status Survey Is the patient deaf or have difficulty hearing?: No Does the patient have difficulty seeing, even when wearing glasses/contacts?: No Does the patient have difficulty concentrating, remembering, or making decisions?: No Does the patient have difficulty walking or climbing stairs?: No Does the patient have difficulty dressing or bathing?: No Does the patient have difficulty doing errands alone such as visiting a doctor's office or shopping?: No  Can patient draw a clock face showing 3:15 oclock, yes  Fall Risk Screen Fall Risk  05/28/2017  Falls in the past year? No    Gait Assessment: Normal gait observed yes  Advanced directives Does patient have a Health Care Power of Attorney? No Does patient have a Living Will? No  Past Medical History:  Diagnosis Date  . Anemia   . Aortic stenosis    Echo 04/2008:  EF 60%, mean AV gradient 7 mmHg.   . Diabetes mellitus   . Diverticula of colon    per colonoscopy6/20/11  . Edema of lower extremity    wears ted hose  . H/O echocardiogram 2014   EF 65%, slight gradient of aortic valve, no major findings otherwise  . Hx of cardiovascular stress test    Adenosine Myoview 04/2008:  Normal.  . Hyperlipidemia   . Hypertension   . Hypothyroidism   . LBBB (left  bundle branch block)   . Obesity   . 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 Tubulovillous adenoma   . Wears dentures    upper  . Wears glasses     Past Surgical History:  Procedure Laterality Date  . CARPAL TUNNEL RELEASE     bilat  . COLECTOMY  08/13/09   laparoscopic converted to open transverse colectomy w/ side to side anastomosis  . COLONOSCOPY  09/01/10   Dr. Charna ElizabethJyothi Mann; polyp, tubular adenoma; repeat 3 years  . LASIK    . TRANSTHORACIC ECHOCARDIOGRAM  11/2012; 06/2016   a. EF~65%. Mild ES. Mean gradient 13 mmHg. Peak 20 mmHg;; b. Normal LV size and function.  EF 60-65%.  Possible mild LVOT gradient.  Marland Kitchen. VEIN SURGERY  2010   right leg    Social History   Socioeconomic History  . Marital status: Divorced    Spouse name: Not on file  . Number of children: Not on file  . Years of education: Not on file  . Highest education level: Not on file  Occupational History  . Not on file  Social Needs  . Financial resource strain: Not on file  . Food insecurity    Worry: Not on file    Inability: Not on file  . Transportation needs    Medical: Not on file    Non-medical: Not on file  Tobacco Use  . Smoking status: Never  Smoker  . Smokeless tobacco: Never Used  Substance and Sexual Activity  . Alcohol use: No  . Drug use: No  . Sexual activity: Not on file    Comment: married, 5 children, works at [employer], exrecise with walking  Lifestyle  . Physical activity    Days per week: Not on file    Minutes per session: Not on file  . Stress: Not on file  Relationships  . Social Musician on phone: Not on file    Gets together: Not on file    Attends religious service: Not on file    Active member of club or organization: Not on file    Attends meetings of clubs or organizations: Not on file    Relationship status: Not on file  . Intimate partner violence    Fear of current or ex partner: Not on file    Emotionally  abused: Not on file    Physically abused: Not on file    Forced sexual activity: Not on file  Other Topics Concern  . Not on file  Social History Narrative   Single, works part time at [employer], exercises with walking, lives alon    Family History  Problem Relation Age of Onset  . Cancer Father        stomach cancer  . Diabetes Sister   . CAD Sister        s/p PCI  . CAD Mother        s/p PCI  . CAD Brother        s/p PCI     Current Outpatient Medications:  .  atorvastatin (LIPITOR) 20 MG tablet, Take 1 tablet (20 mg total) by mouth daily., Disp: 90 tablet, Rfl: 3 .  B-D UF III MINI PEN NEEDLES 31G X 5 MM MISC, 1 EACH BY DOES NOT APPLY ROUTE AS DIRECTED., Disp: 100 each, Rfl: 11 .  cetirizine (ZYRTEC) 10 MG tablet, Take 1 tablet (10 mg total) by mouth at bedtime., Disp: 90 tablet, Rfl: 3 .  cholecalciferol (VITAMIN D3) 25 MCG (1000 UT) tablet, Take 1 tablet (1,000 Units total) by mouth daily., Disp: 90 tablet, Rfl: 3 .  fluticasone (FLONASE) 50 MCG/ACT nasal spray, Place 2 sprays into both nostrils daily., Disp: 16 g, Rfl: 6 .  furosemide (LASIX) 20 MG tablet, TAKE 1 TABLET BY MOUTH EVERY DAY, Disp: 90 tablet, Rfl: 0 .  levothyroxine (SYNTHROID) 75 MCG tablet, TAKE 1 TABLET BY MOUTH EVERY DAY, Disp: 90 tablet, Rfl: 0 .  liraglutide (VICTOZA) 18 MG/3ML SOPN, INJECT 1.8 MG UNDER THE SKIN ONCE DAILY, Disp: 3 mL, Rfl: 0 .  lisinopril-hydrochlorothiazide (PRINZIDE,ZESTORETIC) 20-12.5 MG tablet, Take 1 tablet by mouth daily., Disp: 90 tablet, Rfl: 3 .  doxycycline (VIBRA-TABS) 100 MG tablet, Take 1 tablet (100 mg total) by mouth 2 (two) times daily., Disp: 20 tablet, Rfl: 0 .  Emollient (LUBRIDERM DAILY MOISTURE) LOTN, Apply 1 application topically daily., Disp: 473 mL, Rfl: 2 .  potassium chloride (K-DUR,KLOR-CON) 10 MEQ tablet, Take 1 tablet (10 mEq total) by mouth daily. (Patient not taking: Reported on 11/01/2018), Disp: 90 tablet, Rfl: 0  Allergies  Allergen Reactions  .  Codeine     History reviewed: allergies, current medications, past family history, past medical history, past social history, past surgical history and problem list   issues discussed: Similar to prior visit she requested antibiotic at the start of the visit, says she may have a sinus infection sinus  congestion but no fever no sore throat no cough no wheezing.  She wants something for skin irritation and bumps on her forearms and arms.  Is a chronic issue for her.  Compliant with medications for blood pressure and cholesterol and diabetes and thyroid  Objective:      Biometrics BP 120/70   Pulse 71   Temp 98.4 F (36.9 C) (Oral)   Ht 5\' 7"  (1.702 m)   Wt 248 lb 3.2 oz (112.6 kg)   SpO2 97%   BMI 38.87 kg/m    Wt Readings from Last 3 Encounters:  11/01/18 248 lb 3.2 oz (112.6 kg)  06/07/18 248 lb (112.5 kg)  10/13/17 248 lb 12.8 oz (112.9 kg)    General appearance: alert, no distress, WD/WN, African American female  Nutritional Status: Inadequate calore intake? no Loss of muscle mass? no Loss of fat beneath skin? no Localized or general edema? no Diminished functional status? no  Other pertinent exam: Skin: dry skin, some nonspecific crusted healing 2-3 mm diameter lesions of bilat forearms and upper arms, suggestive of skin picking HEENT: normocephalic, sclerae anicteric, TMs pearly, nares patent, no discharge or erythema, pharynx normal Oral cavity: MMM, no lesions Neck: supple, no lymphadenopathy, no thyromegaly, no masses Heart: 2/6 systolic murmur heart in upper sternal border, RRR, normal S1, S2 Lungs: CTA bilaterally, no wheezes, rhonchi, or rales Abdomen: +bs, soft, non tender, non distended, no masses, no hepatomegaly, no splenomegaly Musculoskeletal: nontender, no swelling, no obvious deformity Extremities: no edema, no cyanosis, no clubbing Pulses: 2+ symmetric, upper and lower extremities, normal cap refill Neurological: alert, oriented x 3, CN2-12  intact, strength normal upper extremities and lower extremities, sensation normal throughout, DTRs 2+ throughout, no cerebellar signs, gait normal Psychiatric: normal affect, pleasant  Breast/gyn/rectal - declined  Diabetic Foot Exam - Simple   Simple Foot Form Diabetic Foot exam was performed with the following findings: Yes 11/01/2018 10:18 AM  Visual Inspection See comments: Yes Sensation Testing See comments: Yes Pulse Check See comments: Yes Comments      Assessment:   Encounter Diagnoses  Name Primary?  . Encounter for health maintenance examination in adult Yes  . Essential hypertension   . LBBB (left bundle branch block)   . Mild aortic stenosis by prior echocardiogram   . Varicose veins of both lower extremities, unspecified whether complicated   . Chronic venous insufficiency   . Polyp of colon, unspecified part of colon, unspecified type   . Diabetes mellitus with complication ([place])   . Hypothyroidism, unspecified type   . Morbid obesity ([place])   . Hyperlipidemia with target low density lipoprotein (LDL) cholesterol less than 100 mg/dL   . Impaired cognitive perception pattern   . Screening for cancer   . Need for influenza vaccination   . Screening for breast cancer   . Vaccine counseling   . Estrogen deficiency   . Post-menopausal   . Medicare annual wellness visit, initial      Plan:   A preventative services visit was completed today.  During the course of the visit today, we discussed and counseled about appropriate screening and preventive services.  A health risk assessment was established today that included a review of current medications, allergies, social history, family history, medical and preventative health history, biometrics, and preventative screenings to identify potential safety concerns or impairments.  A personalized plan was printed today for your records and use.   Personalized health advice and education was given today to reduce health  risks and promote self management and wellness.  Information regarding end of life planning was discussed today.  Conditions/risks identified: Cognitively impaired - I don't always feels she understands her diagnoses and recommendations.  She lives alone, is independent, but still her cognition is a barrier to care.   Chronic problems discussed today: HTN, hx/o LBBB, aortic stenosis - c/t statin, anti-hypertensive medication  Varicose veins - advised she continue compression hose, regular exercise  diabetes - c/t glucose monitoring, work on losing weight, c/t same medications  Poly of colon - due back with Dr. Loreta Ave for repeat colonoscopy  hypothyroid sim - c/t same medication  Hyperlipidemia - c/t same medication  Obesity - counseled on need to lose weight thorough lifestyle changes    Acute problems discussed today: Sinus congestion - she often requests antibiotic although its not clear there is significant infection but its hard to convince her otherwise  Skin dermatitis issues - begin Lubriderm daily for dry skin   Recommendations:  I recommend a yearly ophthalmology/optometry visit for glaucoma screening and eye checkup  I recommended a yearly dental visit for hygiene and checkup  Advanced directives - discussed nature and purpose of Advanced Directives, encouraged them to complete them if they have not done so and/or encouraged them to get [place] a copy if they have done this already.  Referrals today: Mammogram and bone density  Immunizations: Counseled on the influenza virus vaccine.  Vaccine information sheet given.   High dose Influenza vaccine given after consent obtained.  Counseled on the Shingrix vaccine.  Vaccine information sheet given. Shingrix #2vaccine given after consent obtained.    She is up to date on Td and pneumococcal vaccines    Kathryn [name] was seen today for medicare wellness.  Diagnoses and all orders for this visit:  Encounter for health  maintenance examination in adult -     Hemoglobin A1c -     Comprehensive metabolic panel -     TSH -     Lipid panel -     MM DIGITAL SCREENING BILATERAL; Future -     DG Bone Density; Future  Essential hypertension  LBBB (left bundle branch block)  Mild aortic stenosis by prior echocardiogram  Varicose veins of both lower extremities, unspecified whether complicated  Chronic venous insufficiency  Polyp of colon, unspecified part of colon, unspecified type  Diabetes mellitus with complication ([facility]) -     Hemoglobin A1c  Hypothyroidism, unspecified type -     TSH  Morbid obesity ([facility])  Hyperlipidemia with target low density lipoprotein (LDL) cholesterol less than 100 mg/dL  Impaired cognitive perception pattern  Screening for cancer  Need for influenza vaccination  Screening for breast cancer -     MM DIGITAL SCREENING BILATERAL; Future -     Flu Vaccine QUAD High Dose(Fluad)  Vaccine counseling  Estrogen deficiency -     DG Bone Density; Future  Post-menopausal -     DG Bone Density; Future  Medicare annual wellness visit, initial  Other orders -     doxycycline (VIBRA-TABS) 100 MG tablet; Take 1 tablet (100 mg total) by mouth 2 (two) times daily. -     Emollient (LUBRIDERM DAILY MOISTURE) LOTN; Apply 1 application topically daily. -     Varicella-zoster vaccine IM (Shingrix)     Medicare Attestation A preventative services visit was completed today.  During the course of the visit the patient was educated and counseled about appropriate screening and preventive services.  A health risk assessment  was established with the patient that included a review of current medications, allergies, social history, family history, medical and preventative health history, biometrics, and preventative screenings to identify potential safety concerns or impairments.  A personalized plan was printed today for the patient's records and use.   Personalized health advice and  education was given today to reduce health risks and promote self management and wellness.  Information regarding end of life planning was discussed today.  Kristian CoveyShane Tysinger, PA-C   11/02/2018

## 2018-11-02 ENCOUNTER — Other Ambulatory Visit: Payer: Self-pay | Admitting: Medical

## 2018-11-02 LAB — COMPREHENSIVE METABOLIC PANEL
ALT: 16 IU/L (ref 0–32)
AST: 16 IU/L (ref 0–40)
Albumin/Globulin Ratio: 1.3 (ref 1.2–2.2)
Albumin: 4 g/dL (ref 3.8–4.8)
Alkaline Phosphatase: 85 IU/L (ref 39–117)
BUN/Creatinine Ratio: 11 — ABNORMAL LOW (ref 12–28)
BUN: 10 mg/dL (ref 8–27)
Bilirubin Total: 0.4 mg/dL (ref 0.0–1.2)
CO2: 26 mmol/L (ref 20–29)
Calcium: 9.9 mg/dL (ref 8.7–10.3)
Chloride: 104 mmol/L (ref 96–106)
Creatinine, Ser: 0.91 mg/dL (ref 0.57–1.00)
GFR calc Af Amer: 76 mL/min/{1.73_m2} (ref >59)
GFR calc non Af Amer: 66 mL/min/{1.73_m2} (ref >59)
Globulin, Total: 3.1 g/dL (ref 1.5–4.5)
Glucose: 114 mg/dL — ABNORMAL HIGH (ref 65–99)
Potassium: 4.2 mmol/L (ref 3.5–5.2)
Sodium: 145 mmol/L — ABNORMAL HIGH (ref 134–144)
Total Protein: 7.1 g/dL (ref 6.0–8.5)

## 2018-11-02 LAB — LIPID PANEL
Chol/HDL Ratio: 2.9 ratio (ref 0.0–4.4)
Cholesterol, Total: 114 mg/dL (ref 100–199)
HDL: 39 mg/dL — ABNORMAL LOW (ref >39)
LDL Chol Calc (NIH): 59 mg/dL (ref 0–99)
Triglycerides: 79 mg/dL (ref 0–149)
VLDL Cholesterol Cal: 16 mg/dL (ref 5–40)

## 2018-11-02 LAB — HEMOGLOBIN A1C
Est. average glucose Bld gHb Est-mCnc: 126 mg/dL
Hgb A1c MFr Bld: 6 % — ABNORMAL HIGH (ref 4.8–5.6)

## 2018-11-02 LAB — TSH: TSH: 0.767 u[IU]/mL (ref 0.450–4.500)

## 2018-11-02 MED ORDER — VICTOZA 18 MG/3ML ~~LOC~~ SOPN: PEN_INJECTOR | SUBCUTANEOUS | 5 refills | Status: DC

## 2018-11-02 MED ORDER — POTASSIUM CHLORIDE CRYS ER 10 MEQ PO TBCR: 10.0000 meq | EXTENDED_RELEASE_TABLET | Freq: Every day | ORAL | 3 refills | Status: DC

## 2018-11-02 MED ORDER — FLUTICASONE PROPIONATE 50 MCG/ACT NA SUSP: 2.0000 | Freq: Every day | NASAL | 6 refills | Status: DC

## 2018-11-02 MED ORDER — LEVOTHYROXINE SODIUM 75 MCG PO TABS: 75.0000 ug | ORAL_TABLET | Freq: Every day | ORAL | 3 refills | Status: DC

## 2018-11-02 MED ORDER — ATORVASTATIN CALCIUM 20 MG PO TABS: 20.0000 mg | ORAL_TABLET | Freq: Every day | ORAL | 3 refills | Status: DC

## 2018-11-02 NOTE — Patient Instructions (Addendum)
Thanks for trusting [place] with your health care and for coming in for a physical today.  Below are some general recommendations I have for you:  Yearly screenings See your eye doctor yearly for routine vision care. See your dentist yearly for routine dental care including hygiene visits twice yearly. See me here yearly for a routine physical and preventative care visit   Specific Concerns today:  . Continue plan to get your bone density scan and mammogram . I recommend you exercise at least 5 days/week . I recommend a low-carb low-fat diet . Continue your current medications . Begin Lubriderm lotion daily to help with skin issues . I prescribed a short course of antibiotics per your concern today   Please follow up yearly for a physical.   Preventative Care for Adults - Female      [place]:  A routine yearly physical is a good way to check in with your primary care provider about your health and preventive screening. It is also an opportunity to share updates about your health and any concerns you have, and receive a thorough all-over exam.   Most health insurance companies pay for at least some preventative services.  Check with your health plan for specific coverages.  WHAT PREVENTATIVE SERVICES DO MEN NEED?  Adult men should have their weight and blood pressure checked regularly.   Men age 73 and older should have their cholesterol levels checked regularly.  Beginning at age 72 and continuing to age 58, men should be screened for colorectal cancer.  Certain people may need continued testing until age 21.  Updating vaccinations is part of preventative care.  Vaccinations help protect against diseases such as the flu.  Osteoporosis is a disease in which the bones lose minerals and strength as we age. Men ages 20 and over should discuss this with their caregivers  Lab tests are generally done as part of preventative care to screen for anemia and blood  disorders, to screen for problems with the kidneys and liver, to screen for bladder problems, to check blood sugar, and to check your cholesterol level.  Preventative services generally include counseling about diet, exercise, avoiding tobacco, drugs, excessive alcohol consumption, and sexually transmitted infections.    GENERAL RECOMMENDATIONS FOR GOOD HEALTH:  Healthy diet:  Eat a variety of foods, including fruit, vegetables, animal or vegetable protein, such as meat, fish, chicken, and eggs, or beans, lentils, tofu, and grains, such as rice.  Drink plenty of water daily.  Decrease saturated fat in the diet, avoid lots of red meat, processed foods, sweets, fast foods, and fried foods.  Exercise:  Aerobic exercise helps maintain good heart health. At least 30-40 minutes of moderate-intensity exercise is recommended. For example, a brisk walk that increases your heart rate and breathing. This should be done on most days of the week.   Find a type of exercise or a variety of exercises that you enjoy so that it becomes a part of your daily life.  Examples are running, walking, swimming, water aerobics, and biking.  For motivation and support, explore group exercise such as aerobic class, spin class, Zumba, Yoga,or  martial arts, etc.    Set exercise goals for yourself, such as a certain weight goal, walk or run in a race such as a 5k walk/run.  Speak to your primary care provider about exercise goals.  Disease prevention:  If you smoke or chew tobacco, find out from your caregiver how to quit. It can  literally save your life, no matter how long you have been a tobacco user. If you do not use tobacco, never begin.   Maintain a healthy diet and normal weight. Increased weight leads to problems with blood pressure and diabetes.   The Body Mass Index or BMI is a way of measuring how much of your body is fat. Having a BMI above 27 increases the risk of heart disease, diabetes, hypertension,  stroke and other problems related to obesity. Your caregiver can help determine your BMI and based on it develop an exercise and dietary program to help you achieve or maintain this important measurement at a healthful level.  High blood pressure causes heart and blood vessel problems.  Persistent high blood pressure should be treated with medicine if weight loss and exercise do not work.   Fat and cholesterol leaves deposits in your arteries that can block them. This causes heart disease and vessel disease elsewhere in your body.  If your cholesterol is found to be high, or if you have heart disease or certain other medical conditions, then you may need to have your cholesterol monitored frequently and be treated with medication.   Ask if you should have a cardiac stress test if your history suggests this. A stress test is a test done on a treadmill that looks for heart disease. This test can find disease prior to there being a problem.  Osteoporosis is a disease in which the bones lose minerals and strength as we age. This can result in serious bone fractures. Risk of osteoporosis can be identified using a bone density scan. Men ages 5765 and over should discuss this with their caregivers. Ask your caregiver whether you should be taking a calcium supplement and Vitamin D, to reduce the rate of osteoporosis.   Avoid drinking alcohol in excess (more than two drinks per day).  Avoid use of street drugs. Do not share needles with anyone. Ask for professional help if you need assistance or instructions on stopping the use of alcohol, cigarettes, and/or drugs.  Brush your teeth twice a day with fluoride toothpaste, and floss once a day. Good oral hygiene prevents tooth decay and gum disease. The problems can be painful, unattractive, and can cause other health problems. Visit your dentist for a routine oral and dental check up and preventive care every 6-12 months.   Look at your skin regularly.  Use a  mirror to look at your back. Notify your caregivers of changes in moles, especially if there are changes in shapes, colors, a size larger than a pencil eraser, an irregular border, or development of new moles.  Safety:  Use seatbelts 100% of the time, whether driving or as a passenger.  Use safety devices such as hearing protection if you work in environments with loud noise or significant background noise.  Use safety glasses when doing any work that could send debris in to the eyes.  Use a helmet if you ride a bike or motorcycle.  Use appropriate safety gear for contact sports.  Talk to your caregiver about gun safety.  Use sunscreen with a SPF (or skin protection factor) of 15 or greater.  Lighter skinned people are at a greater risk of skin cancer. Don't forget to also wear sunglasses in order to protect your eyes from too much damaging sunlight. Damaging sunlight can accelerate cataract formation.   Practice safe sex. Use condoms. Condoms are used for birth control and to help reduce the spread of sexually transmitted  infections (or STIs).  Some of the STIs are gonorrhea (the clap), chlamydia, syphilis, trichomonas, herpes, HPV (human papilloma virus) and HIV (human immunodeficiency virus) which causes AIDS. The herpes, HIV and HPV are viral illnesses that have no cure. These can result in disability, cancer and death.   Keep carbon monoxide and smoke detectors in your home functioning at all times. Change the batteries every 6 months or use a model that plugs into the wall.   Vaccinations:  Stay up to date with your tetanus shots and other required immunizations. You should have a booster for tetanus every 10 years. Be sure to get your flu shot every year, since 5%-20% of the [place] population comes down with the flu. The flu vaccine changes each year, so being vaccinated once is not enough. Get your shot in the fall, before the flu season peaks.   Other vaccines to consider:  Human Papilloma  Virus or HPV causes cancer of the cervix, and other infections that can be transmitted from person to person. There is a vaccine for HPV, and males should get immunized between the ages of 30 and 30. It requires a series of 3 shots.   Pneumococcal vaccine to protect against certain types of pneumonia.  This is normally recommended for adults age 48 or older.  However, adults younger than 66 years old with certain underlying conditions such as diabetes, heart or lung disease should also receive the vaccine.  Shingles vaccine to protect against Varicella Zoster if you are older than age 73, or younger than 66 years old with certain underlying illness.  If you have not had the Shingrix vaccine, please call your insurer to inquire about coverage for the Shingrix vaccine given in 2 doses.   Some insurers cover this vaccine after age 1, some cover this after age 35.  If your insurer covers this, then call to schedule appointment to have this vaccine here  Hepatitis A vaccine to protect against a form of infection of the liver by a virus acquired from food.  Hepatitis B vaccine to protect against a form of infection of the liver by a virus acquired from blood or body fluids, particularly if you work in health care.  If you plan to travel internationally, check with your local health department for specific vaccination recommendations.   What should I know about cancer screening? Many types of cancers can be detected early and may often be prevented. Lung Cancer  You should be screened every year for lung cancer if: ? You are a current smoker who has smoked for at least 30 years. ? You are a former smoker who has quit within the past 15 years.  Talk to your health care provider about your screening options, when you should start screening, and how often you should be screened.  Colorectal Cancer  Routine colorectal cancer screening usually begins at 67 years of age and should be repeated every 5-10  years until you are 65 years old. You may need to be screened more often if early forms of precancerous polyps or small growths are found. Your health care provider may recommend screening at an earlier age if you have risk factors for colon cancer.  Your health care provider may recommend using home test kits to check for hidden blood in the stool.  A small camera at the end of a tube can be used to examine your colon (sigmoidoscopy or colonoscopy). This checks for the earliest forms of colorectal cancer.  Prostate  and Testicular Cancer  Depending on your age and overall health, your health care provider may do certain tests to screen for prostate and testicular cancer.  Talk to your health care provider about any symptoms or concerns you have about testicular or prostate cancer.  Skin Cancer  Check your skin from head to toe regularly.  Tell your health care provider about any new moles or changes in moles, especially if: ? There is a change in a mole's size, shape, or color. ? You have a mole that is larger than a pencil eraser.  Always use sunscreen. Apply sunscreen liberally and repeat throughout the day.  Protect yourself by wearing long sleeves, pants, a wide-brimmed hat, and sunglasses when outside.      Advance Directive  Advance directives are legal documents that let you make choices ahead of time about your health care and medical treatment in case you become unable to communicate for yourself. Advance directives are a way for you to communicate your wishes to family, friends, and health care providers. This can help convey your decisions about end-of-life care if you become unable to communicate. Discussing and writing advance directives should happen over time rather than all at once. Advance directives can be changed depending on your situation and what you want, even after you have signed the advance directives. If you do not have an advance directive, some states  assign family decision makers to act on your behalf based on how closely you are related to them. Each state has its own laws regarding advance directives. You may want to check with your health care provider, attorney, or state representative about the laws in your state. There are different types of advance directives, such as:  Medical power of attorney.  Living will.  Do not resuscitate (DNR) or do not attempt resuscitation (DNAR) order. Health care proxy and medical power of attorney A health care proxy, also called a health care agent, is a person who is appointed to make medical decisions for you in cases in which you are unable to make the decisions yourself. Generally, people choose someone they know well and trust to represent their preferences. Make sure to ask this person for an agreement to act as your proxy. A proxy may have to exercise judgment in the event of a medical decision for which your wishes are not known. A medical power of attorney is a legal document that names your health care proxy. Depending on the laws in your state, after the document is written, it may also need to be:  Signed.  Notarized.  Dated.  Copied.  Witnessed.  Incorporated into your medical record. You may also want to appoint someone to manage your financial affairs in a situation in which you are unable to do so. This is called a durable power of attorney for finances. It is a separate legal document from the durable power of attorney for health care. You may choose the same person or someone different from your health care proxy to act as your agent in financial matters. If you do not appoint a proxy, or if there is a concern that the proxy is not acting in your best interests, a court-appointed guardian may be designated to act on your behalf. Living will A living will is a set of instructions documenting your wishes about medical care when you cannot express them yourself. Health care providers  should keep a copy of your living will in your medical record. You may want  to give a copy to family members or friends. To alert caregivers in case of an emergency, you can place a card in your wallet to let them know that you have a living will and where they can find it. A living will is used if you become:  Terminally ill.  Incapacitated.  Unable to communicate or make decisions. Items to consider in your living will include:  The use or non-use of life-sustaining equipment, such as dialysis machines and breathing machines (ventilators).  A DNR or DNAR order, which is the instruction not to use cardiopulmonary resuscitation (CPR) if breathing or heartbeat stops.  The use or non-use of tube feeding.  Withholding of food and fluids.  Comfort (palliative) care when the goal becomes comfort rather than a cure.  Organ and tissue donation. A living will does not give instructions for distributing your money and property if you should pass away. It is recommended that you seek the advice of a lawyer when writing a will. Decisions about taxes, beneficiaries, and asset distribution will be legally binding. This process can relieve your family and friends of any concerns surrounding disputes or questions that may come up about the distribution of your assets. DNR or DNAR A DNR or DNAR order is a request not to have CPR in the event that your heart stops beating or you stop breathing. If a DNR or DNAR order has not been made and shared, a health care provider will try to help any patient whose heart has stopped or who has stopped breathing. If you plan to have surgery, talk with your health care provider about how your DNR or DNAR order will be followed if problems occur. Summary  Advance directives are the legal documents that allow you to make choices ahead of time about your health care and medical treatment in case you become unable to communicate for yourself.  The process of discussing and  writing advance directives should happen over time. You can change the advance directives, even after you have signed them.  Advance directives include DNR or DNAR orders, living wills, and designating an agent as your medical power of attorney. This information is not intended to replace advice given to you by your health care provider. Make sure you discuss any questions you have with your health care provider. Document Released: 05/19/2007 Document Revised: 03/16/2018 Document Reviewed: 12/30/2015 Elsevier Patient Education  2020 [employer]

## 2018-12-26 ENCOUNTER — Other Ambulatory Visit: Payer: Self-pay | Admitting: Medical

## 2019-01-09 ENCOUNTER — Ambulatory Visit
Admission: RE | Admit: 2019-01-09 | Discharge: 2019-01-09 | Disposition: A | Discharge status: Home / Self Care | Payer: 59 | Source: Ambulatory Visit | Attending: Medical | Admitting: Medical

## 2019-01-09 ENCOUNTER — Other Ambulatory Visit: Payer: Self-pay

## 2019-01-09 DIAGNOSIS — Z780 Asymptomatic menopausal state: Secondary | ICD-10-CM

## 2019-01-09 DIAGNOSIS — E2839 Other primary ovarian failure: Secondary | ICD-10-CM

## 2019-01-09 DIAGNOSIS — Z1239 Encounter for other screening for malignant neoplasm of breast: Secondary | ICD-10-CM

## 2019-01-09 DIAGNOSIS — Z0000 Encounter for general adult medical examination without abnormal findings: Secondary | ICD-10-CM

## 2019-01-11 ENCOUNTER — Other Ambulatory Visit: Payer: Self-pay | Admitting: Medical

## 2019-01-11 DIAGNOSIS — R928 Other abnormal and inconclusive findings on diagnostic imaging of breast: Secondary | ICD-10-CM

## 2019-01-18 ENCOUNTER — Other Ambulatory Visit: Payer: Self-pay

## 2019-01-18 ENCOUNTER — Ambulatory Visit: Payer: 59

## 2019-01-18 ENCOUNTER — Ambulatory Visit
Admission: RE | Admit: 2019-01-18 | Discharge: 2019-01-18 | Disposition: A | Discharge status: Home / Self Care | Payer: 59 | Source: Ambulatory Visit | Attending: Medical | Admitting: Medical

## 2019-01-18 DIAGNOSIS — R928 Other abnormal and inconclusive findings on diagnostic imaging of breast: Secondary | ICD-10-CM

## 2019-02-08 ENCOUNTER — Ambulatory Visit (HOSPITAL_COMMUNITY)
Admission: EM | Admit: 2019-02-08 | Discharge: 2019-02-08 | Disposition: A | Discharge status: Home / Self Care | Payer: 59 | Attending: Family Medicine | Admitting: Family Medicine

## 2019-02-08 ENCOUNTER — Encounter (HOSPITAL_COMMUNITY): Payer: Self-pay

## 2019-02-08 ENCOUNTER — Other Ambulatory Visit: Payer: Self-pay

## 2019-02-08 DIAGNOSIS — Z20828 Contact with and (suspected) exposure to other viral communicable diseases: Secondary | ICD-10-CM | POA: Diagnosis not present

## 2019-02-08 DIAGNOSIS — Z0189 Encounter for other specified special examinations: Secondary | ICD-10-CM

## 2019-02-08 NOTE — Discharge Instructions (Signed)
If your Covid-19 test is positive, you will receive a phone call from [place] regarding your results. Negative test results are not called. Both positive and negative results area always visible on MyChart. If you do not have a MyChart account, sign up instructions are in your discharge papers.  

## 2019-02-08 NOTE — ED Triage Notes (Signed)
Pt. States she was eating crablegs at a friend's house last Friday & she experienced facial swelling,rash on left arm was left behind.

## 2019-02-10 LAB — NOVEL CORONAVIRUS, NAA (HOSP ORDER, SEND-OUT TO REF LAB; TAT 18-24 HRS): SARS-CoV-2, NAA: NOT DETECTED

## 2019-02-11 NOTE — ED Provider Notes (Signed)
[facility]   [id number] 02/08/19 Arrival Time: 1647  ASSESSMENT & PLAN:  1. Patient request for diagnostic testing      COVID-19 testing sent. To self-quarantine until results are available. If requested, work note provided.  Follow-up Information    Tysinger, Kermit Balo, PA-C.   Specialty: Family Medicine Why: As needed. Contact information: [address] [place] [address] [phone]           Reviewed expectations re: course of current medical issues. Questions answered. Outlined signs and symptoms indicating need for more acute intervention. Patient verbalized understanding. After Visit Summary given.   SUBJECTIVE: History from: patient. Kathryn TULLIUS is a 68 y.o. female who requests COVID-19 testing. Known COVID-19 contact: questions if friend positive. Recent travel: none. Did have a rash a few days ago; gone. Denies: runny nose, congestion, fever, cough, sore throat, difficulty breathing and headache. Normal PO intake without n/v/d.  ROS: As per HPI.   OBJECTIVE:  Vitals:   02/08/19 1857 02/08/19 1859  BP: (!) 154/95   Pulse: 67   Resp: 18   Temp: 98.5 F (36.9 C)   TempSrc: Oral   SpO2: 99%   Weight:  111.1 kg    General appearance: alert; no distress Eyes: PERRLA; EOMI; conjunctiva normal HENT: [place]; AT; nasal mucosa normal; oral mucosa normal Neck: supple  Lungs: speaks full sentences without difficulty; unlabored Heart: regular rate and rhythm Abdomen: soft, non-tender Extremities: no edema Skin: warm and dry Neurologic: normal gait Psychological: alert and cooperative; normal mood and affect  Labs:  Labs Reviewed  NOVEL CORONAVIRUS, NAA (HOSP ORDER, SEND-OUT TO REF LAB; TAT 18-24 HRS)      Allergies  Allergen Reactions  . Codeine     Past Medical History:  Diagnosis Date  . Anemia   . Aortic stenosis    Echo 04/2008:  EF 60%, mean AV gradient 7 mmHg.   . Diabetes mellitus   . Diverticula of colon     per colonoscopy6/20/11  . Edema of lower extremity    wears ted hose  . H/O echocardiogram 2014   EF 65%, slight gradient of aortic valve, no major findings otherwise  . Hx of cardiovascular stress test    Adenosine Myoview 04/2008:  Normal.  . Hyperlipidemia   . Hypertension   . Hypothyroidism   . LBBB (left bundle branch block)   . Obesity   . 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 Tubulovillous adenoma   . Wears dentures    upper  . Wears glasses    Social History   Socioeconomic History  . Marital status: Divorced    Spouse name: Not on file  . Number of children: Not on file  . Years of education: Not on file  . Highest education level: Not on file  Occupational History  . Not on file  Tobacco Use  . Smoking status: Never Smoker  . Smokeless tobacco: Never Used  Substance and Sexual Activity  . Alcohol use: No  . Drug use: No  . Sexual activity: Not on file    Comment: married, 5 children, works at [employer], exrecise with walking  Other Topics Concern  . Not on file  Social History Narrative   Single, works part time at [employer], exercises with walking, lives alon   Social Determinants of Health   Financial Resource Strain:   . Difficulty of Paying Living Expenses: Not on file  Food Insecurity:   .  Worried About Charity fundraiser in the Last Year: Not on file  . Ran Out of Food in the Last Year: Not on file  Transportation Needs:   . Lack of Transportation (Medical): Not on file  . Lack of Transportation (Non-Medical): Not on file  Physical Activity:   . Days of Exercise per Week: Not on file  . Minutes of Exercise per Session: Not on file  Stress:   . Feeling of Stress : Not on file  Social Connections:   . Frequency of Communication with Friends and Family: Not on file  . Frequency of Social Gatherings with Friends and Family: Not on file  . Attends Religious Services: Not on file  . Active Member of  Clubs or Organizations: Not on file  . Attends Archivist Meetings: Not on file  . Marital Status: Not on file  Intimate Partner Violence:   . Fear of Current or Ex-Partner: Not on file  . Emotionally Abused: Not on file  . Physically Abused: Not on file  . Sexually Abused: Not on file   Family History  Problem Relation Age of Onset  . Cancer Father        stomach cancer  . Diabetes Sister   . CAD Sister        s/p PCI  . CAD Mother        s/p PCI  . CAD Brother        s/p PCI   Past Surgical History:  Procedure Laterality Date  . CARPAL TUNNEL RELEASE     bilat  . COLECTOMY  08/13/09   laparoscopic converted to open transverse colectomy w/ side to side anastomosis  . COLONOSCOPY  09/01/10   Dr. Juanita Craver; polyp, tubular adenoma; repeat 3 years  . LASIK    . TRANSTHORACIC ECHOCARDIOGRAM  11/2012; 06/2016   a. EF~65%. Mild ES. Mean gradient 13 mmHg. Peak 20 mmHg;; b. Normal LV size and function.  EF 60-65%.  Possible mild LVOT gradient.  Marland Kitchen VEIN SURGERY  2010   right leg     Vanessa Kick, MD 02/11/19 [phone]

## 2019-02-16 ENCOUNTER — Telehealth: Payer: Self-pay | Admitting: Emergency Medicine

## 2019-03-06 ENCOUNTER — Other Ambulatory Visit: Payer: Self-pay

## 2019-03-06 ENCOUNTER — Encounter: Payer: Self-pay | Admitting: Medical

## 2019-03-06 ENCOUNTER — Ambulatory Visit (INDEPENDENT_AMBULATORY_CARE_PROVIDER_SITE_OTHER): Payer: 59 | Admitting: Medical

## 2019-03-06 VITALS — BP 128/82 | HR 84 | Temp 98.4°F | Wt 241.0 lb

## 2019-03-06 DIAGNOSIS — E785 Hyperlipidemia, unspecified: Secondary | ICD-10-CM

## 2019-03-06 DIAGNOSIS — L989 Disorder of the skin and subcutaneous tissue, unspecified: Secondary | ICD-10-CM

## 2019-03-06 DIAGNOSIS — E039 Hypothyroidism, unspecified: Secondary | ICD-10-CM

## 2019-03-06 DIAGNOSIS — E118 Type 2 diabetes mellitus with unspecified complications: Secondary | ICD-10-CM

## 2019-03-06 DIAGNOSIS — R600 Localized edema: Secondary | ICD-10-CM

## 2019-03-06 DIAGNOSIS — I10 Essential (primary) hypertension: Secondary | ICD-10-CM | POA: Diagnosis not present

## 2019-03-06 DIAGNOSIS — Z7251 High risk heterosexual behavior: Secondary | ICD-10-CM

## 2019-03-06 DIAGNOSIS — R4189 Other symptoms and signs involving cognitive functions and awareness: Secondary | ICD-10-CM

## 2019-03-06 DIAGNOSIS — F09 Unspecified mental disorder due to known physiological condition: Secondary | ICD-10-CM

## 2019-03-06 DIAGNOSIS — I872 Venous insufficiency (chronic) (peripheral): Secondary | ICD-10-CM

## 2019-03-06 DIAGNOSIS — Z113 Encounter for screening for infections with a predominantly sexual mode of transmission: Secondary | ICD-10-CM

## 2019-03-06 MED ORDER — DOXYCYCLINE HYCLATE 100 MG PO TABS: 100.0000 mg | ORAL_TABLET | Freq: Two times a day (BID) | ORAL | 0 refills | Status: DC

## 2019-03-06 MED ORDER — TRIAMCINOLONE ACETONIDE 0.1 % EX CREA: 1.0000 "application " | TOPICAL_CREAM | Freq: Two times a day (BID) | CUTANEOUS | 0 refills | Status: DC

## 2019-03-06 MED ORDER — LUBRIDERM DAILY MOISTURE EX LOTN: 1.0000 "application " | TOPICAL_LOTION | Freq: Every day | CUTANEOUS | 2 refills | Status: DC

## 2019-03-06 NOTE — Progress Notes (Signed)
Subjective: Chief Complaint  Patient presents with  . other    4 month f/u   Here for med check.   Diabetes - compliant with victoza.  Checking blood sugars, getting 90s.  No foot concerns.   Checks sugars in evenings  HTN - compliant with medications. Doesn't have a BP cuff at home.    Compliant with thyroid medicaiton.   Exercising some with walking.    She notes some recent bumps came up few weeks ago on arms.  Thought it was related to eating crabs.  Had red bumps on both arms like chiggers, swelled up all over.  Went to urgent care.  Refused steroid shot.   They advised benadryl which she did.  They did not put her on antibiotic but she feels she needs one now to help clear up the lesions.    Doesn't take the lasix every day, only when fluid is worse.     Past Medical History:  Diagnosis Date  . Anemia   . Aortic stenosis    Echo 04/2008:  EF 60%, mean AV gradient 7 mmHg.   . Diabetes mellitus   . Diverticula of colon    per colonoscopy6/20/11  . Edema of lower extremity    wears ted hose  . H/O echocardiogram 2014   EF 65%, slight gradient of aortic valve, no major findings otherwise  . Hx of cardiovascular stress test    Adenosine Myoview 04/2008:  Normal.  . Hyperlipidemia   . Hypertension   . Hypothyroidism   . LBBB (left bundle branch block)   . Obesity   . 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 Tubulovillous adenoma   . Wears dentures    upper  . Wears glasses    Current Outpatient Medications on File Prior to Visit  Medication Sig Dispense Refill  . atorvastatin (LIPITOR) 20 MG tablet Take 1 tablet (20 mg total) by mouth daily. 90 tablet 3  . B-D UF III MINI PEN NEEDLES 31G X 5 MM MISC 1 EACH BY DOES NOT APPLY ROUTE AS DIRECTED. 100 each 11  . cetirizine (ZYRTEC) 10 MG tablet Take 1 tablet (10 mg total) by mouth at bedtime. 90 tablet 3  . cholecalciferol (VITAMIN D3) 25 MCG (1000 UT) tablet Take 1 tablet (1,000 Units  total) by mouth daily. 90 tablet 3  . fluticasone (FLONASE) 50 MCG/ACT nasal spray Place 2 sprays into both nostrils daily. 16 g 6  . furosemide (LASIX) 20 MG tablet TAKE 1 TABLET BY MOUTH EVERY DAY 90 tablet 0  . levothyroxine (SYNTHROID) 75 MCG tablet Take 1 tablet (75 mcg total) by mouth daily. 90 tablet 3  . liraglutide (VICTOZA) 18 MG/3ML SOPN INJECT 1.8 MG UNDER THE SKIN ONCE DAILY 3 mL 5  . lisinopril-hydrochlorothiazide (PRINZIDE,ZESTORETIC) 20-12.5 MG tablet Take 1 tablet by mouth daily. 90 tablet 3  . potassium chloride (K-DUR) 10 MEQ tablet Take 1 tablet (10 mEq total) by mouth daily. 90 tablet 3   No current facility-administered medications on file prior to visit.   ROS as in subjective    Objective: BP 128/82   Pulse 84   Temp 98.4 F (36.9 C)   Wt 241 lb (109.3 kg)   BMI 37.75 kg/m   Wt Readings from Last 3 Encounters:  03/06/19 241 lb (109.3 kg)  02/08/19 245 lb (111.1 kg)  11/01/18 248 lb 3.2 oz (112.6 kg)   Gen: wd, wn, nad, African American female Lungs clear  Heart rrr, normal s1, s2, 2/6 murmur? Heard best in upper sternal borders No obvious LE edema 1+ pedal pulses, 2+ UE pulses Skin: numerous 3-4 mm raised round somewhat papular lesions scattered throughout bilat arms upper and lower and a few similar lesions of left lower leg, a few lesions with depression or ulcerated center that is erythematous.   No pus drainage, no warmth, no surrounding erythema.   Diabetic Foot Exam - Simple   Simple Foot Form Visual Inspection No deformities, no ulcerations, no other skin breakdown bilaterally: Yes Sensation Testing Intact to touch and monofilament testing bilaterally: Yes Pulse Check See comments: Yes Comments 1+ pedal pulses        Assessment: Encounter Diagnoses  Name Primary?  . Diabetes mellitus with complication ([place]) Yes  . Essential hypertension   . Chronic venous insufficiency   . Hypothyroidism, unspecified type   . Morbid obesity  ([place])   . Hyperlipidemia with target low density lipoprotein (LDL) cholesterol less than 100 mg/dL   . Bilateral lower extremity edema   . Impaired cognitive perception pattern   . Skin lesions   . Screen for STD (sexually transmitted disease)   . Unprotected sex       Plan: Skin lesions - unclear etiology, possibly bacterial.  Begin triamcinolone cream topically, doxycyline oral.   If not resolved within 7-10 days or much improved, call back/likely referral to derm  Can use Lubriderm for daily moisturizing lotion  STD screen given unprotected sex and new skin lesions  C/t same medication for diabetes, BP, lipids.  C/t efforts to lose weight through health diet and exercise  I asked her to bring in a family member preferably at next visit to get additional history and an idea of her overall social situation, relative functional status.    Daughter Elige Radon lives in [place].  Sees her every other day.       Lesette was seen today for other.  Diagnoses and all orders for this visit:  Diabetes mellitus with complication ([place]) -     Hemoglobin A1c -     Basic Metabolic Panel -     HIV regular -     RPR regular  Essential hypertension -     Basic Metabolic Panel  Chronic venous insufficiency -     Basic Metabolic Panel  Hypothyroidism, unspecified type  Morbid obesity ([place])  Hyperlipidemia with target low density lipoprotein (LDL) cholesterol less than 100 mg/dL  Bilateral lower extremity edema -     Basic Metabolic Panel  Impaired cognitive perception pattern -     HIV regular -     RPR regular  Skin lesions -     HIV regular -     RPR regular  Screen for STD (sexually transmitted disease)  Unprotected sex -     HIV regular -     RPR regular  Other orders -     Emollient (LUBRIDERM DAILY MOISTURE) LOTN; Apply 1 application topically daily. -     doxycycline (VIBRA-TABS) 100 MG tablet; Take 1 tablet (100 mg total) by mouth 2 (two) times daily. -      triamcinolone cream (KENALOG) 0.1 %; Apply 1 application topically 2 (two) times daily.

## 2019-03-07 LAB — BASIC METABOLIC PANEL
BUN/Creatinine Ratio: 19 (ref 12–28)
BUN: 20 mg/dL (ref 8–27)
CO2: 24 mmol/L (ref 20–29)
Calcium: 10.4 mg/dL — ABNORMAL HIGH (ref 8.7–10.3)
Chloride: 104 mmol/L (ref 96–106)
Creatinine, Ser: 1.07 mg/dL — ABNORMAL HIGH (ref 0.57–1.00)
GFR calc Af Amer: 63 mL/min/{1.73_m2} (ref >59)
GFR calc non Af Amer: 54 mL/min/{1.73_m2} — ABNORMAL LOW (ref >59)
Glucose: 85 mg/dL (ref 65–99)
Potassium: 4.4 mmol/L (ref 3.5–5.2)
Sodium: 142 mmol/L (ref 134–144)

## 2019-03-07 LAB — HEMOGLOBIN A1C
Est. average glucose Bld gHb Est-mCnc: 126 mg/dL
Hgb A1c MFr Bld: 6 % — ABNORMAL HIGH (ref 4.8–5.6)

## 2019-03-07 LAB — RPR: RPR Ser Ql: NONREACTIVE

## 2019-03-07 LAB — HIV ANTIBODY (ROUTINE TESTING W REFLEX): HIV Screen 4th Generation wRfx: NONREACTIVE

## 2019-03-09 ENCOUNTER — Ambulatory Visit (INDEPENDENT_AMBULATORY_CARE_PROVIDER_SITE_OTHER): Payer: 59 | Admitting: Cardiology

## 2019-03-09 ENCOUNTER — Other Ambulatory Visit: Payer: Self-pay

## 2019-03-09 ENCOUNTER — Encounter: Payer: Self-pay | Admitting: Cardiology

## 2019-03-09 VITALS — BP 124/79 | HR 68 | Temp 97.2°F | Ht 67.0 in | Wt 240.6 lb

## 2019-03-09 DIAGNOSIS — I10 Essential (primary) hypertension: Secondary | ICD-10-CM | POA: Diagnosis not present

## 2019-03-09 DIAGNOSIS — I447 Left bundle-branch block, unspecified: Secondary | ICD-10-CM | POA: Diagnosis not present

## 2019-03-09 DIAGNOSIS — E785 Hyperlipidemia, unspecified: Secondary | ICD-10-CM

## 2019-03-09 DIAGNOSIS — I350 Nonrheumatic aortic (valve) stenosis: Secondary | ICD-10-CM

## 2019-03-09 NOTE — Progress Notes (Signed)
Primary Care Provider: Carlena Hurl, PA-C Cardiologist: No primary care provider on file. Electrophysiologist:   Clinic Note: Chief Complaint  Patient presents with  . Follow-up    2 yr  . Aortic Stenosis    HPI:    Kathryn [name] is a 69 y.o. female with a PMH notable for mild AS and LBBB who presents today for what is to be 2-year follow-up.  Kathryn [name] was last seen in December 2018.  She was doing relatively well with no major , complaints.  Trying to lose weight.  Recent Hospitalizations: None  Reviewed  CV studies:    The following studies were reviewed today: (if available, images/films reviewed: From Epic Chart or Care Everywhere) . (May 2018) 2D Echo: Aortic valve appears to open well, there appears to be a possible small subvalvular gradient with hyper dynamic LV function..  Also noted moderate elevated PA pressures.  Interval History:   Kathryn [name] returns here today overall feeling fine.  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  But she has been doing fine with no major symptoms.  Otherwise everything stable.  She denies any angina or heart failure symptoms.  She notes some right arm discomfort when she wakes up but otherwise has had no cardiac symptoms to speak of.  CV Review of Symptoms (Summary): no chest pain or dyspnea on exertion negative for - edema, irregular heartbeat, orthopnea, palpitations, paroxysmal nocturnal dyspnea, rapid heart rate, shortness of breath or Syncope/near syncope, TIA/amaurosis fugax.  Claudication  The patient does not have symptoms concerning for COVID-19 infection (fever, chills, cough, or new shortness of breath).  The patient is practicing social distancing & Masking.    REVIEWED OF SYSTEMS   A limited ROS was performed.  Pertinent symptoms noted above.  Otherwise negative Review of Systems  Constitutional: Negative for malaise/fatigue and weight loss.  HENT: Negative for  congestion and nosebleeds.   Gastrointestinal: Negative for abdominal pain, blood in stool and melena.  Genitourinary: Negative for hematuria.  Neurological: Negative for dizziness.  Endo/Heme/Allergies: Positive for environmental allergies.   I have reviewed and (if needed) personally updated the patient's problem list, medications, allergies, past medical and surgical history, social and family history.   PAST MEDICAL HISTORY   Past Medical History:  Diagnosis Date  . Anemia   . Aortic stenosis 2010   Echo 04/2008:  EF 60%, mean AV gradient 7 mmHg.-Remains mild AS by echo in May 2018.  There appears to be a subvalvular component with hyperdynamic LV function.  . Diabetes mellitus   . Diverticula of colon    per colonoscopy6/20/11  . Edema of lower extremity    wears ted hose  . H/O echocardiogram 2014   EF 65%, slight gradient of aortic valve, no major findings otherwise  . Hx of cardiovascular stress test    Adenosine Myoview 04/2008:  Normal.  . Hyperlipidemia   . Hypertension   . Hypothyroidism   . LBBB (left bundle branch block)   . Obesity   . 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 Tubulovillous adenoma   . Wears dentures    upper  . Wears glasses     PAST SURGICAL HISTORY   Past Surgical History:  Procedure Laterality Date  . CARPAL TUNNEL RELEASE     bilat  . COLECTOMY  08/13/09   laparoscopic converted to open transverse colectomy w/ side to side anastomosis  . COLONOSCOPY  09/01/10   Dr. Charna Elizabeth; polyp, tubular adenoma; repeat 3 years  . LASIK    . TRANSTHORACIC ECHOCARDIOGRAM  11/2012; 06/2016   a. EF~65%. Mild AS. Mean gradient 13 mmHg. Peak 20 mmHg;; b. Normal LV size and function.  EF 60-65%.  Possible mild LVOT gradient.  Marland Kitchen VEIN SURGERY  2010   right leg    MEDICATIONS/ALLERGIES   Current Meds  Medication Sig  . atorvastatin (LIPITOR) 20 MG tablet Take 1 tablet (20 mg total) by mouth daily.  . B-D UF III MINI  PEN NEEDLES 31G X 5 MM MISC 1 EACH BY DOES NOT APPLY ROUTE AS DIRECTED.  Marland Kitchen cetirizine (ZYRTEC) 10 MG tablet Take 1 tablet (10 mg total) by mouth at bedtime.  . cholecalciferol (VITAMIN D3) 25 MCG (1000 UT) tablet Take 1 tablet (1,000 Units total) by mouth daily.  Marland Kitchen doxycycline (VIBRA-TABS) 100 MG tablet Take 1 tablet (100 mg total) by mouth 2 (two) times daily.  . Emollient (LUBRIDERM DAILY MOISTURE) LOTN Apply 1 application topically daily.  . fluticasone (FLONASE) 50 MCG/ACT nasal spray Place 2 sprays into both nostrils daily.  . furosemide (LASIX) 20 MG tablet TAKE 1 TABLET BY MOUTH EVERY DAY  . levothyroxine (SYNTHROID) 75 MCG tablet Take 1 tablet (75 mcg total) by mouth daily.  Marland Kitchen liraglutide (VICTOZA) 18 MG/3ML SOPN INJECT 1.8 MG UNDER THE SKIN ONCE DAILY  . lisinopril-hydrochlorothiazide (PRINZIDE,ZESTORETIC) 20-12.5 MG tablet Take 1 tablet by mouth daily.  . potassium chloride (K-DUR) 10 MEQ tablet Take 1 tablet (10 mEq total) by mouth daily.  Marland Kitchen triamcinolone cream (KENALOG) 0.1 % Apply 1 application topically 2 (two) times daily.    Allergies  Allergen Reactions  . Codeine     SOCIAL HISTORY/FAMILY HISTORY   Social History   Tobacco Use  . Smoking status: Never Smoker  . Smokeless tobacco: Never Used  Substance Use Topics  . Alcohol use: No  . Drug use: No   Social History   Social History Narrative   Single, works part time at [employer], exercises with walking, lives alone    Family History family history includes CAD in her brother, mother, and sister; Cancer in her father; Diabetes in her sister.   OBJCTIVE -PE, EKG, labs   Wt Readings from Last 3 Encounters:  03/09/19 240 lb 9.6 oz (109.1 kg)  03/06/19 241 lb (109.3 kg)  02/08/19 245 lb (111.1 kg)    Physical Exam: BP 124/79   Pulse 68   Temp (!) 97.2 F (36.2 C)   Ht 5\' 7"  (1.702 m)   Wt 240 lb 9.6 oz (109.1 kg)   SpO2 100%   BMI 37.68 kg/m  Physical Exam  Constitutional: She is oriented to  person, place, and time. She appears well-developed and well-nourished. No distress.  Morbidly obese.  Well-groomed.  HENT:  Head: Normocephalic and atraumatic.  Neck: No hepatojugular reflux and no JVD (Difficult to assess due to body habitus.) present. Carotid bruit is not present. No thyromegaly present.  Cardiovascular: Normal rate, regular rhythm and intact distal pulses.  No extrasystoles are present. PMI is not displaced. Exam reveals no gallop and no friction rub.  Murmur heard.  Harsh crescendo-decrescendo early systolic murmur is present with a grade of 2/6 at the upper right sternal border radiating to the neck. Pulmonary/Chest: Effort normal and breath sounds normal. No respiratory distress. She has no wheezes. She has no rales.  Abdominal: Soft. Bowel sounds are normal. There is no abdominal tenderness.  Musculoskeletal:  General: Edema (Trivial) present. Normal range of motion.     Cervical back: Normal range of motion and neck supple.  Neurological: She is alert and oriented to person, place, and time.  Psychiatric: She has a normal mood and affect. Her behavior is normal. Judgment and thought content normal.  Vitals reviewed.   Adult ECG Report  Rate: 68 ;  Rhythm: normal sinus rhythm and Sinus arrhythmia; LBBB, borderline right atrial enlargement.  Narrative Interpretation: Stable EKG  Recent Labs:    Lab Results  Component Value Date   CHOL 114 11/01/2018   HDL 39 (L) 11/01/2018   LDLCALC 59 11/01/2018   TRIG 79 11/01/2018   CHOLHDL 2.9 11/01/2018   Lab Results  Component Value Date   CREATININE 1.07 (H) 03/06/2019   BUN 20 03/06/2019   NA 142 03/06/2019   K 4.4 03/06/2019   CL 104 03/06/2019   CO2 24 03/06/2019    ASSESSMENT/PLAN    Problem List Items Addressed This Visit    Morbid obesity (HCC) (Chronic)    Discussed importance of dietary modification and exercise.  Needs to lose weight.  The patient understands the need to lose weight with  diet and exercise. We have discussed specific strategies for this.      Hyperlipidemia with target low density lipoprotein (LDL) cholesterol less than 100 mg/dL (Chronic)    Remains on modest dose atorvastatin with well-controlled lipids. Monitored by PCP.      Essential hypertension (Chronic)    Well-controlled on current meds.  Managed by PCP.      LBBB (left bundle branch block) (Chronic)   Relevant Orders   EKG 12-Lead (Completed)   ECHOCARDIOGRAM COMPLETE   Mild aortic stenosis by prior echocardiogram - Primary (Chronic)   Relevant Orders   ECHOCARDIOGRAM COMPLETE       COVID-19 Education: The signs and symptoms of COVID-19 were discussed with the patient and how to seek care for testing (follow up with PCP or arrange E-visit).   The importance of social distancing was discussed today.  I spent a total of with the patient and chart review. >  50% of the time was spent in direct patient consultation.  Additional time spent with chart review (studies, outside notes, etc): 6 Total Time: 22 min   Current medicines are reviewed at length with the patient today.  (+/- concerns) none   Patient Instructions / Medication Changes & Studies & Tests Ordered   Patient Instructions  Medication Instructions:  NO CHANGES *If you need a refill on your cardiac medications before your next appointment, please call your pharmacy*  Lab Work: NOT NEEDED  Testing/Procedures: WILL BE SCHEDULE AT [address] Your physician has requested that you have an echocardiogram. Echocardiography is a painless test that uses sound waves to create images of your heart. It provides your doctor with information about the size and shape of your heart and how well your heart's chambers and valves are working. This procedure takes approximately one hour. There are no restrictions for this procedure.    Follow-Up: At [facility], you and your health needs are our  priority.  As part of our continuing mission to provide you with exceptional heart care, we have created designated Provider Care Teams.  These Care Teams include your primary Cardiologist (physician) and Advanced Practice Providers (APPs -  Physician Assistants and Nurse Practitioners) who all work together to provide you with the care you need, when you need it.  Your next appointment:   12 month(s)  The format for your next appointment:   In Person  Provider:   Bryan Lemma, MD      Studies Ordered:   Orders Placed This Encounter  Procedures  . EKG 12-Lead  . ECHOCARDIOGRAM COMPLETE     Bryan Lemma, M.D., M.S. Interventional Cardiologist   Pager # [phone] Phone # [phone] [address] [place], [place] [address]   Thank you for choosing Heartcare at [facility]!!

## 2019-03-09 NOTE — Patient Instructions (Signed)
Medication Instructions:  NO CHANGES *If you need a refill on your cardiac medications before your next appointment, please call your pharmacy*  Lab Work: NOT NEEDED  Testing/Procedures: WILL BE SCHEDULE AT [address] Your physician has requested that you have an echocardiogram. Echocardiography is a painless test that uses sound waves to create images of your heart. It provides your doctor with information about the size and shape of your heart and how well your heart's chambers and valves are working. This procedure takes approximately one hour. There are no restrictions for this procedure.    Follow-Up: At [facility], you and your health needs are our priority.  As part of our continuing mission to provide you with exceptional heart care, we have created designated Provider Care Teams.  These Care Teams include your primary Cardiologist (physician) and Advanced Practice Providers (APPs -  Physician Assistants and Nurse Practitioners) who all work together to provide you with the care you need, when you need it.  Your next appointment:   12 month(s)  The format for your next appointment:   In Person  Provider:   Bryan Lemma, MD

## 2019-03-12 ENCOUNTER — Encounter: Payer: Self-pay | Admitting: Cardiology

## 2019-03-12 NOTE — Assessment & Plan Note (Addendum)
Discussed importance of dietary modification and exercise.  Needs to lose weight.  The patient understands the need to lose weight with diet and exercise. We have discussed specific strategies for this.

## 2019-03-12 NOTE — Assessment & Plan Note (Signed)
Remains on modest dose atorvastatin with well-controlled lipids. Monitored by PCP.

## 2019-03-12 NOTE — Assessment & Plan Note (Signed)
Well-controlled on current meds.  Managed by PCP.

## 2019-03-21 ENCOUNTER — Other Ambulatory Visit: Payer: Self-pay

## 2019-03-21 ENCOUNTER — Ambulatory Visit (HOSPITAL_COMMUNITY): Payer: 59 | Attending: Cardiology

## 2019-03-21 DIAGNOSIS — I447 Left bundle-branch block, unspecified: Secondary | ICD-10-CM | POA: Diagnosis not present

## 2019-03-21 DIAGNOSIS — I350 Nonrheumatic aortic (valve) stenosis: Secondary | ICD-10-CM | POA: Insufficient documentation

## 2019-04-03 ENCOUNTER — Telehealth: Payer: Self-pay | Admitting: Medical

## 2019-04-03 NOTE — Telephone Encounter (Signed)
Patient has been taking Victoza. She has informed she would like to be put on the wait list for COVID vaccine.

## 2019-04-03 NOTE — Telephone Encounter (Signed)
I received a report from their insurer's care management service informing me that they may not be taking their medication or either have stopped taking a particular medication - Victoza.  If this is not the case and they are still taking this medication, then disregard this message.

## 2019-04-11 NOTE — Telephone Encounter (Signed)
I have placed patient on the wait list

## 2019-04-11 NOTE — Telephone Encounter (Signed)
Please call since we are not dispensing covid vaccine here   Covid Vaccine Info  I recommend you check out these links for info on the Covid Vaccine, how to sign up for the vaccine, and location of vaccine opportunities in the area.    [facility] Department website link to sign up for the covid vaccine:  [email or web address]    [email or web address]

## 2019-04-18 ENCOUNTER — Telehealth: Payer: Self-pay | Admitting: *Deleted

## 2019-04-18 NOTE — Telephone Encounter (Signed)
-----   Message from Marykay Lex, MD sent at 03/22/2019 11:09 PM EST ----- Echocardiogram result: Normal pump function with ejection fraction of 60 to 65%.  This upper limit of normal.  No evidence of wall motion normalities to suggest prior heart attack.  Both atria are mildly enlarged. The aortic valve does not show evidence of stenosis.  There is sclerosis-hardening, but no stenosis/narrowing.  There is a small gradient but the valve opens well.

## 2019-04-18 NOTE — Telephone Encounter (Signed)
Left message to call back - echo result

## 2019-04-19 ENCOUNTER — Telehealth: Payer: Self-pay | Admitting: Cardiology

## 2019-04-19 NOTE — Telephone Encounter (Signed)
Follow up  Pt returning your call regarding echo results.

## 2019-04-19 NOTE — Telephone Encounter (Signed)
New Message  We are recommending the COVID-19 vaccine to all of our patients. Cardiac medications (including blood thinners) should not deter anyone from being vaccinated and there is no need to hold any of those medications prior to vaccine administration.     Currently, there is a hotline to call (active 03/03/19) to schedule vaccination appointments as no walk-ins will be accepted.   Number: [phone].    If an appointment is not available please go to [email or web address] to sign up for notification when additional vaccine appointments are available.   If you have further questions or concerns about the vaccine process, please visit [email or web address] or contact your primary care physician.   

## 2019-04-21 NOTE — Telephone Encounter (Signed)
The patient has been notified of the result and verbalized understanding.  All questions (if any) were answered.  patient aware recommend to have covid  vaccine Tobin Chad, RN 04/21/2019 12:01 PM

## 2019-04-23 ENCOUNTER — Ambulatory Visit: Payer: 59 | Attending: Internal Medicine

## 2019-04-23 DIAGNOSIS — Z23 Encounter for immunization: Secondary | ICD-10-CM | POA: Insufficient documentation

## 2019-04-23 NOTE — Progress Notes (Signed)
   Covid-19 Vaccination Clinic  Name:  Kathryn [name]    MRN: [id number] DOB: 1950-06-04  04/23/2019  Ms. Uzelac was observed post Covid-19 immunization for 15 minutes without incidence. She was provided with Vaccine Information Sheet and instruction to access the V-Safe system.   Ms. Seaberry was instructed to call 911 with any severe reactions post vaccine: Marland Kitchen Difficulty breathing  . Swelling of your face and throat  . A fast heartbeat  . A bad rash all over your body  . Dizziness and weakness    Immunizations Administered    Name Date Dose VIS Date Route   Pfizer COVID-19 Vaccine 04/23/2019 11:40 AM 0.3 mL 02/03/2019 Intramuscular   Manufacturer: [employer], [employer]   Lot: [id number]   NDC: [address]-2

## 2019-05-23 ENCOUNTER — Ambulatory Visit: Payer: 59 | Attending: Internal Medicine

## 2019-05-23 DIAGNOSIS — Z23 Encounter for immunization: Secondary | ICD-10-CM

## 2019-05-23 NOTE — Progress Notes (Signed)
   Covid-19 Vaccination Clinic  Name:  Kathryn [name]    MRN: [id number] DOB: December 23, 1950  05/23/2019  [name] was observed post Covid-19 immunization for 15 minutes without incident. She was provided with Vaccine Information Sheet and instruction to access the V-Safe system.   [name] was instructed to call 911 with any severe reactions post vaccine: Marland Kitchen Difficulty breathing  . Swelling of face and throat  . A fast heartbeat  . A bad rash all over body  . Dizziness and weakness   Immunizations Administered    Name Date Dose VIS Date Route   Pfizer COVID-19 Vaccine 05/23/2019 11:22 AM 0.3 mL 02/03/2019 Intramuscular   Manufacturer: [employer], [employer]   Lot: [id number]   NDC: [address]-1

## 2019-06-05 ENCOUNTER — Other Ambulatory Visit: Payer: Self-pay

## 2019-06-05 ENCOUNTER — Telehealth: Payer: Self-pay | Admitting: Medical

## 2019-06-05 ENCOUNTER — Encounter: Payer: Self-pay | Admitting: Medical

## 2019-06-05 ENCOUNTER — Ambulatory Visit (INDEPENDENT_AMBULATORY_CARE_PROVIDER_SITE_OTHER): Payer: 59 | Admitting: Medical

## 2019-06-05 VITALS — BP 122/78 | HR 73 | Temp 97.6°F | Ht 67.0 in | Wt 251.6 lb

## 2019-06-05 DIAGNOSIS — L989 Disorder of the skin and subcutaneous tissue, unspecified: Secondary | ICD-10-CM

## 2019-06-05 DIAGNOSIS — R4189 Other symptoms and signs involving cognitive functions and awareness: Secondary | ICD-10-CM

## 2019-06-05 DIAGNOSIS — I10 Essential (primary) hypertension: Secondary | ICD-10-CM | POA: Diagnosis not present

## 2019-06-05 DIAGNOSIS — R7989 Other specified abnormal findings of blood chemistry: Secondary | ICD-10-CM | POA: Diagnosis not present

## 2019-06-05 DIAGNOSIS — F09 Unspecified mental disorder due to known physiological condition: Secondary | ICD-10-CM

## 2019-06-05 DIAGNOSIS — E039 Hypothyroidism, unspecified: Secondary | ICD-10-CM | POA: Diagnosis not present

## 2019-06-05 DIAGNOSIS — E118 Type 2 diabetes mellitus with unspecified complications: Secondary | ICD-10-CM

## 2019-06-05 DIAGNOSIS — I872 Venous insufficiency (chronic) (peripheral): Secondary | ICD-10-CM | POA: Diagnosis not present

## 2019-06-05 MED ORDER — VITAMIN D 25 MCG (1000 UNIT) PO TABS: 1000.0000 [IU] | ORAL_TABLET | Freq: Every day | ORAL | 3 refills | Status: DC

## 2019-06-05 MED ORDER — FUROSEMIDE 20 MG PO TABS: 20.0000 mg | ORAL_TABLET | Freq: Every day | ORAL | 1 refills | Status: DC

## 2019-06-05 MED ORDER — LISINOPRIL-HYDROCHLOROTHIAZIDE 20-12.5 MG PO TABS: 1.0000 | ORAL_TABLET | Freq: Every day | ORAL | 3 refills | Status: DC

## 2019-06-05 MED ORDER — VICTOZA 18 MG/3ML ~~LOC~~ SOPN: PEN_INJECTOR | SUBCUTANEOUS | 0 refills | Status: DC

## 2019-06-05 MED ORDER — CLINDAMYCIN PHOS-BENZOYL PEROX 1-5 % EX GEL: Freq: Two times a day (BID) | CUTANEOUS | 0 refills | Status: DC

## 2019-06-05 MED ORDER — DOXYCYCLINE HYCLATE 100 MG PO TABS: 100.0000 mg | ORAL_TABLET | Freq: Two times a day (BID) | ORAL | 0 refills | Status: DC

## 2019-06-05 MED ORDER — FLUTICASONE PROPIONATE 50 MCG/ACT NA SUSP: 2.0000 | Freq: Every day | NASAL | 11 refills | Status: DC

## 2019-06-05 MED ORDER — CETIRIZINE HCL 10 MG PO TABS: 10.0000 mg | ORAL_TABLET | Freq: Every day | ORAL | 3 refills | Status: DC

## 2019-06-05 NOTE — Telephone Encounter (Signed)
Done

## 2019-06-05 NOTE — Progress Notes (Signed)
Subjective:  Kathryn [name] is a 69 y.o. female who presents for Chief Complaint  Patient presents with  . Follow-up     Here for f/u.  She is running out of Victoza early but does use it daily.   Her only concern is not losing weight.  Trying to exercise.   Eats typically 2 times per day.  Otherwise compliant with medications.  She is up to date on Covid vaccine.   She had a home nurse eval recently to discuss preventative care measures.  She notes victoza quantity is not enough.  Has skin concerns, wants a "strong" medication to clear this up.  No other c/o.  The following portions of the patient's history were reviewed and updated as appropriate: allergies, current medications, past family history, past medical history, past social history, past surgical history and problem list.   Past Medical History:  Diagnosis Date  . Anemia   . Aortic stenosis 2010   Echo 04/2008:  EF 60%, mean AV gradient 7 mmHg.-Remains mild AS by echo in May 2018.  There appears to be a subvalvular component with hyperdynamic LV function.  . Diabetes mellitus   . Diverticula of colon    per colonoscopy6/20/11  . Edema of lower extremity    wears ted hose  . H/O echocardiogram 2014   EF 65%, slight gradient of aortic valve, no major findings otherwise  . Hx of cardiovascular stress test    Adenosine Myoview 04/2008:  Normal.  . Hyperlipidemia   . Hypertension   . Hypothyroidism   . LBBB (left bundle branch block)   . Obesity   . 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 Tubulovillous adenoma   . Wears dentures    upper  . Wears glasses    Current Outpatient Medications on File Prior to Visit  Medication Sig Dispense Refill  . atorvastatin (LIPITOR) 20 MG tablet Take 1 tablet (20 mg total) by mouth daily. 90 tablet 3  . B-D UF III MINI PEN NEEDLES 31G X 5 MM MISC 1 EACH BY DOES NOT APPLY ROUTE AS DIRECTED. 100 each 11  . Emollient (LUBRIDERM DAILY MOISTURE) LOTN  Apply 1 application topically daily. 473 mL 2  . levothyroxine (SYNTHROID) 75 MCG tablet Take 1 tablet (75 mcg total) by mouth daily. 90 tablet 3  . potassium chloride (K-DUR) 10 MEQ tablet Take 1 tablet (10 mEq total) by mouth daily. 90 tablet 3  . triamcinolone cream (KENALOG) 0.1 % Apply 1 application topically 2 (two) times daily. 30 g 0   No current facility-administered medications on file prior to visit.    ROS Otherwise as in subjective above    Objective: BP 122/78   Pulse 73   Temp 97.6 F (36.4 C)   Ht 5\' 7"  (1.702 m)   Wt 251 lb 9.6 oz (114.1 kg)   SpO2 98%   BMI 39.41 kg/m    Wt Readings from Last 3 Encounters:  06/05/19 251 lb 9.6 oz (114.1 kg)  03/09/19 240 lb 9.6 oz (109.1 kg)  03/06/19 241 lb (109.3 kg)    General appearance: alert, no distress, well developed, well nourished Neck: supple, no lymphadenopathy, no thyromegaly, no masses Heart: RRR, normal S1, S2, no murmurs Lungs: CTA bilaterally, no wheezes, rhonchi, or rales Abdomen: +bs, soft, non tender, non distended, no masses, no hepatomegaly, no splenomegaly Pulses: 2+ radial pulses, 2+ pedal pulses, normal cap refill Ext: no edema Skin: left forehead with 2.5  cm diameter brown somewhat thickened slight raised lesion, bilat arms with numerous scattered 5-23mm diameter brown lesions in healing stages    Assessment: Encounter Diagnoses  Name Primary?  . Creatinine elevation Yes  . Essential hypertension   . Chronic venous insufficiency   . Hypothyroidism, unspecified type   . Diabetes mellitus with complication ([place])   . Elevated LFTs   . Impaired cognitive perception pattern   . Skin lesion      Plan: HTN, elevated creatinine - labs today, c/t current medicaiton  Diabetes - I called pharmacy and there is a glitch in the Epic EMR as month supply quantity of Victoza should be 14ml although default is 9ml.    Nevertheless, pharmacist said she is not consistent on refills in general, not  getting it every single month.  I will refer to [employer] for weight loss vs diabetes study.  We may end up using the study, and coming off Victoza daily injection since there are other options for therapy such as weekly ozempic or oral GLP1 medication.  Hypothyroidism - c/t same therapy  Skin lesion - new left forehead lesion, healing skin lesions of arms.  Begin doxycycline, topical therapy below, BenzaClin.  If not improved within 10 days, consider dermatology referral.   Refilled several medications today that were coming up due for refill  Zadia was seen today for follow-up.  Diagnoses and all orders for this visit:  Creatinine elevation -     Comprehensive metabolic panel  Essential hypertension -     Comprehensive metabolic panel  Chronic venous insufficiency  Hypothyroidism, unspecified type  Diabetes mellitus with complication (HCC) -     Comprehensive metabolic panel -     Hemoglobin A1c  Elevated LFTs  Impaired cognitive perception pattern  Skin lesion  Other orders -     cetirizine (ZYRTEC) 10 MG tablet; Take 1 tablet (10 mg total) by mouth at bedtime. -     cholecalciferol (VITAMIN D3) 25 MCG (1000 UNIT) tablet; Take 1 tablet (1,000 Units total) by mouth daily. -     fluticasone (FLONASE) 50 MCG/ACT nasal spray; Place 2 sprays into both nostrils daily. -     furosemide (LASIX) 20 MG tablet; Take 1 tablet (20 mg total) by mouth daily. -     lisinopril-hydrochlorothiazide (ZESTORETIC) 20-12.5 MG tablet; Take 1 tablet by mouth daily. -     doxycycline (VIBRA-TABS) 100 MG tablet; Take 1 tablet (100 mg total) by mouth 2 (two) times daily. -     clindamycin-benzoyl peroxide (BENZACLIN) gel; Apply topically 2 (two) times daily. -     liraglutide (VICTOZA) 18 MG/3ML SOPN; INJECT 1.8 MG UNDER THE SKIN ONCE DAILY    Follow up: pending call back, study

## 2019-06-05 NOTE — Telephone Encounter (Signed)
Please refer to pharmqest to either weight loss or diabetes study.  She is stable on current therapies for months.

## 2019-06-06 LAB — COMPREHENSIVE METABOLIC PANEL
ALT: 19 IU/L (ref 0–32)
AST: 18 IU/L (ref 0–40)
Albumin/Globulin Ratio: 1.4 (ref 1.2–2.2)
Albumin: 4 g/dL (ref 3.8–4.8)
Alkaline Phosphatase: 102 IU/L (ref 39–117)
BUN/Creatinine Ratio: 11 — ABNORMAL LOW (ref 12–28)
BUN: 10 mg/dL (ref 8–27)
Bilirubin Total: 0.3 mg/dL (ref 0.0–1.2)
CO2: 25 mmol/L (ref 20–29)
Calcium: 9.9 mg/dL (ref 8.7–10.3)
Chloride: 108 mmol/L — ABNORMAL HIGH (ref 96–106)
Creatinine, Ser: 0.91 mg/dL (ref 0.57–1.00)
GFR calc Af Amer: 76 mL/min/{1.73_m2} (ref >59)
GFR calc non Af Amer: 65 mL/min/{1.73_m2} (ref >59)
Globulin, Total: 2.9 g/dL (ref 1.5–4.5)
Glucose: 126 mg/dL — ABNORMAL HIGH (ref 65–99)
Potassium: 4.8 mmol/L (ref 3.5–5.2)
Sodium: 144 mmol/L (ref 134–144)
Total Protein: 6.9 g/dL (ref 6.0–8.5)

## 2019-06-06 LAB — HEMOGLOBIN A1C
Est. average glucose Bld gHb Est-mCnc: 128 mg/dL
Hgb A1c MFr Bld: 6.1 % — ABNORMAL HIGH (ref 4.8–5.6)

## 2019-06-09 ENCOUNTER — Other Ambulatory Visit: Payer: Self-pay | Admitting: Medical

## 2019-06-09 MED ORDER — OZEMPIC (1 MG/DOSE) 2 MG/1.5ML ~~LOC~~ SOPN: 1.0000 mg | PEN_INJECTOR | SUBCUTANEOUS | 5 refills | Status: DC

## 2019-06-09 NOTE — Progress Notes (Signed)
ozempic

## 2019-09-10 ENCOUNTER — Other Ambulatory Visit: Payer: Self-pay | Admitting: Medical

## 2019-10-06 ENCOUNTER — Telehealth: Payer: Self-pay

## 2019-10-06 ENCOUNTER — Other Ambulatory Visit: Payer: Self-pay

## 2019-10-06 MED ORDER — LEVOTHYROXINE SODIUM 75 MCG PO TABS: 75.0000 ug | ORAL_TABLET | Freq: Every day | ORAL | 3 refills | Status: DC

## 2019-10-06 NOTE — Telephone Encounter (Signed)
done

## 2019-10-06 NOTE — Telephone Encounter (Signed)
Pt. Called LM stating that she needs a refill on her thyroid medicine pt. Last apt. Was 06/05/19 and next apt is 11/02/19.

## 2019-10-07 ENCOUNTER — Other Ambulatory Visit: Payer: Self-pay | Admitting: Medical

## 2019-11-02 ENCOUNTER — Ambulatory Visit (INDEPENDENT_AMBULATORY_CARE_PROVIDER_SITE_OTHER): Payer: 59 | Admitting: Medical

## 2019-11-02 ENCOUNTER — Other Ambulatory Visit: Payer: Self-pay

## 2019-11-02 ENCOUNTER — Encounter: Payer: Self-pay | Admitting: Medical

## 2019-11-02 VITALS — BP 150/80 | HR 64 | Ht 67.0 in | Wt 270.4 lb

## 2019-11-02 DIAGNOSIS — R600 Localized edema: Secondary | ICD-10-CM

## 2019-11-02 DIAGNOSIS — Z23 Encounter for immunization: Secondary | ICD-10-CM

## 2019-11-02 DIAGNOSIS — Z9119 Patient's noncompliance with other medical treatment and regimen: Secondary | ICD-10-CM

## 2019-11-02 DIAGNOSIS — R635 Abnormal weight gain: Secondary | ICD-10-CM

## 2019-11-02 DIAGNOSIS — I8393 Asymptomatic varicose veins of bilateral lower extremities: Secondary | ICD-10-CM

## 2019-11-02 DIAGNOSIS — I10 Essential (primary) hypertension: Secondary | ICD-10-CM | POA: Diagnosis not present

## 2019-11-02 DIAGNOSIS — I350 Nonrheumatic aortic (valve) stenosis: Secondary | ICD-10-CM | POA: Diagnosis not present

## 2019-11-02 DIAGNOSIS — Z7185 Encounter for immunization safety counseling: Secondary | ICD-10-CM

## 2019-11-02 DIAGNOSIS — F09 Unspecified mental disorder due to known physiological condition: Secondary | ICD-10-CM

## 2019-11-02 DIAGNOSIS — R7989 Other specified abnormal findings of blood chemistry: Secondary | ICD-10-CM

## 2019-11-02 DIAGNOSIS — R4189 Other symptoms and signs involving cognitive functions and awareness: Secondary | ICD-10-CM

## 2019-11-02 DIAGNOSIS — Z1231 Encounter for screening mammogram for malignant neoplasm of breast: Secondary | ICD-10-CM

## 2019-11-02 DIAGNOSIS — J301 Allergic rhinitis due to pollen: Secondary | ICD-10-CM

## 2019-11-02 DIAGNOSIS — Z780 Asymptomatic menopausal state: Secondary | ICD-10-CM

## 2019-11-02 DIAGNOSIS — Z7189 Other specified counseling: Secondary | ICD-10-CM | POA: Insufficient documentation

## 2019-11-02 DIAGNOSIS — E2839 Other primary ovarian failure: Secondary | ICD-10-CM

## 2019-11-02 DIAGNOSIS — Z0000 Encounter for general adult medical examination without abnormal findings: Secondary | ICD-10-CM | POA: Insufficient documentation

## 2019-11-02 DIAGNOSIS — E118 Type 2 diabetes mellitus with unspecified complications: Secondary | ICD-10-CM

## 2019-11-02 DIAGNOSIS — I447 Left bundle-branch block, unspecified: Secondary | ICD-10-CM

## 2019-11-02 DIAGNOSIS — K635 Polyp of colon: Secondary | ICD-10-CM

## 2019-11-02 DIAGNOSIS — IMO0002 Reserved for concepts with insufficient information to code with codable children: Secondary | ICD-10-CM

## 2019-11-02 DIAGNOSIS — I872 Venous insufficiency (chronic) (peripheral): Secondary | ICD-10-CM

## 2019-11-02 DIAGNOSIS — Z113 Encounter for screening for infections with a predominantly sexual mode of transmission: Secondary | ICD-10-CM

## 2019-11-02 DIAGNOSIS — E039 Hypothyroidism, unspecified: Secondary | ICD-10-CM

## 2019-11-02 DIAGNOSIS — Z7251 High risk heterosexual behavior: Secondary | ICD-10-CM

## 2019-11-02 DIAGNOSIS — E1169 Type 2 diabetes mellitus with other specified complication: Secondary | ICD-10-CM

## 2019-11-02 DIAGNOSIS — Z91199 Patient's noncompliance with other medical treatment and regimen due to unspecified reason: Secondary | ICD-10-CM

## 2019-11-02 DIAGNOSIS — E785 Hyperlipidemia, unspecified: Secondary | ICD-10-CM

## 2019-11-02 DIAGNOSIS — J029 Acute pharyngitis, unspecified: Secondary | ICD-10-CM

## 2019-11-02 NOTE — Progress Notes (Signed)
Subjective:    Kathryn [name] is a 69 y.o. female who presents for Preventative Services visit and chronic medical problems/med check visit.    Primary Care Provider Tysinger, Kermit Balo, PA-C here for primary care  Current Health Care Team:  Dr. Bryan Lemma, cardiology  Dr. Charna Elizabeth, GI   Medical Services you may have received from other than [facility] providers in the past year (date may be approximate) none  Exercise Current exercise habits: The patient does not participate in regular exercise at present.   Nutrition/Diet Current diet: well balanced  Depression Screen Depression screen PHQ 2/9 11/02/2019  Decreased Interest 0  Down, Depressed, Hopeless 0  PHQ - 2 Score 0    Activities of Daily Living Screen/Functional Status Survey Is the patient deaf or have difficulty hearing?: No Does the patient have difficulty seeing, even when wearing glasses/contacts?: No Does the patient have difficulty concentrating, remembering, or making decisions?: No Does the patient have difficulty walking or climbing stairs?: No Does the patient have difficulty dressing or bathing?: No Does the patient have difficulty doing errands alone such as visiting a doctor's office or shopping?: No  Can patient draw a clock face showing 3:15 oclock, yes  Fall Risk Screen Fall Risk  11/02/2019 05/28/2017  Falls in the past year? 0 No    Gait Assessment: Normal gait observed - yes  Advanced directives Does patient have a Health Care Power of Attorney? No Does patient have a Living Will? No  Past Medical History:  Diagnosis Date  . Anemia   . Aortic stenosis 2010   Echo 04/2008:  EF 60%, mean AV gradient 7 mmHg.-Remains mild AS by echo in May 2018.  There appears to be a subvalvular component with hyperdynamic LV function.  . Diabetes mellitus   . Diverticula of colon    per colonoscopy6/20/11  . Edema of lower extremity    wears ted hose  . H/O echocardiogram 2014   EF 65%, slight  gradient of aortic valve, no major findings otherwise  . Hx of cardiovascular stress test    Adenosine Myoview 04/2008:  Normal.  . Hyperlipidemia   . Hypertension   . Hypothyroidism   . LBBB (left bundle branch block)   . Obesity   . 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 Tubulovillous adenoma   . Wears dentures    upper  . Wears glasses     Past Surgical History:  Procedure Laterality Date  . CARPAL TUNNEL RELEASE     bilat  . COLECTOMY  08/13/09   laparoscopic converted to open transverse colectomy w/ side to side anastomosis  . COLONOSCOPY  09/01/10   Dr. Charna Elizabeth; polyp, tubular adenoma; repeat 3 years  . LASIK    . TRANSTHORACIC ECHOCARDIOGRAM  11/2012; 06/2016   a. EF~65%. Mild AS. Mean gradient 13 mmHg. Peak 20 mmHg;; b. Normal LV size and function.  EF 60-65%.  Possible mild LVOT gradient.  Marland Kitchen VEIN SURGERY  2010   right leg    Social History   Socioeconomic History  . Marital status: Divorced    Spouse name: Not on file  . Number of children: Not on file  . Years of education: Not on file  . Highest education level: Not on file  Occupational History  . Not on file  Tobacco Use  . Smoking status: Never Smoker  . Smokeless tobacco: Never Used  Substance and Sexual Activity  . Alcohol use: No  .  Drug use: No  . Sexual activity: Not on file    Comment: married, 5 children, works at [employer], exrecise with walking  Other Topics Concern  . Not on file  Social History Narrative   Single,  retired, exercises with walking, lives alone.  Daughter checks on her regularly.    10/2019   Social Determinants of Health   Financial Resource Strain:   . Difficulty of Paying Living Expenses: Not on file  Food Insecurity:   . Worried About Programme researcher, broadcasting/film/videounning Out of Food in the Last Year: Not on file  . Ran Out of Food in the Last Year: Not on file  Transportation Needs:   . Lack of Transportation (Medical): Not on file  . Lack of  Transportation (Non-Medical): Not on file  Physical Activity:   . Days of Exercise per Week: Not on file  . Minutes of Exercise per Session: Not on file  Stress:   . Feeling of Stress : Not on file  Social Connections:   . Frequency of Communication with Friends and Family: Not on file  . Frequency of Social Gatherings with Friends and Family: Not on file  . Attends Religious Services: Not on file  . Active Member of Clubs or Organizations: Not on file  . Attends BankerClub or Organization Meetings: Not on file  . Marital Status: Not on file  Intimate Partner Violence:   . Fear of Current or Ex-Partner: Not on file  . Emotionally Abused: Not on file  . Physically Abused: Not on file  . Sexually Abused: Not on file    Family History  Problem Relation Age of Onset  . Cancer Father        stomach cancer  . Diabetes Sister   . CAD Sister        s/p PCI  . CAD Mother        s/p PCI  . CAD Brother        s/p PCI     Current Outpatient Medications:  .  atorvastatin (LIPITOR) 20 MG tablet, Take 1 tablet (20 mg total) by mouth daily., Disp: 90 tablet, Rfl: 3 .  B-D UF III MINI PEN NEEDLES 31G X 5 MM MISC, 1 EACH BY DOES NOT APPLY ROUTE AS DIRECTED., Disp: 100 each, Rfl: 11 .  cetirizine (ZYRTEC) 10 MG tablet, Take 1 tablet (10 mg total) by mouth at bedtime., Disp: 90 tablet, Rfl: 3 .  cholecalciferol (VITAMIN D3) 25 MCG (1000 UNIT) tablet, Take 1 tablet (1,000 Units total) by mouth daily., Disp: 90 tablet, Rfl: 3 .  Emollient (LUBRIDERM DAILY MOISTURE) LOTN, Apply 1 application topically daily., Disp: 473 mL, Rfl: 2 .  fluticasone (FLONASE) 50 MCG/ACT nasal spray, Place 2 sprays into both nostrils daily., Disp: 16 g, Rfl: 11 .  furosemide (LASIX) 20 MG tablet, Take 1 tablet (20 mg total) by mouth daily., Disp: 90 tablet, Rfl: 1 .  levothyroxine (SYNTHROID) 75 MCG tablet, Take 1 tablet (75 mcg total) by mouth daily., Disp: 90 tablet, Rfl: 3 .  lisinopril-hydrochlorothiazide (ZESTORETIC)  20-12.5 MG tablet, Take 1 tablet by mouth daily., Disp: 90 tablet, Rfl: 3 .  potassium chloride (K-DUR) 10 MEQ tablet, Take 1 tablet (10 mEq total) by mouth daily., Disp: 90 tablet, Rfl: 3 .  Semaglutide, 1 MG/DOSE, (OZEMPIC, 1 MG/DOSE,) 2 MG/1.5ML SOPN, Inject 1 mg into the skin once a week. (Patient not taking: Reported on 11/02/2019), Disp: 6 mL, Rfl: 5  Allergies  Allergen Reactions  . Codeine  History reviewed: allergies, current medications, past family history, past medical history, past social history, past surgical history and problem list  Chronic issues discussed: Diabetes type 2-she reports she quit taking Ozempic a few months ago.  She did not like the way it made her feel so she stopped it.  She wants the " pills" instead.  She says she checks her sugars and that they look pretty good.  No foot concerns  High cholesterol-compliant with Lipitor without complaint  Hypothyroidism-compliant with Synthroid without complaint  Edema-she only takes the Lasix sometimes.  High blood pressure-compliant with lisinopril HCT   Acute issues discussed: She notes some occasional sore throat irritated throat.   Objective:     Biometrics BP (!) 150/80   Pulse 64   Ht  (1.702 m)   Wt 270 lb 6.4 oz (122.7 kg)   SpO2 97%   BMI 42.35 kg/m   Wt Readings from Last 3 Encounters:  11/02/19 270 lb 6.4 oz (122.7 kg)  06/05/19 251 lb 9.6 oz (114.1 kg)  03/09/19 240 lb 9.6 oz (109.1 kg)   BP Readings from Last 3 Encounters:  11/02/19 (!) 150/80  06/05/19 122/78  03/09/19 124/79     Cognitive Testing  Alert? Yes  Normal Appearance?Yes  Oriented to person? Yes  Place? Yes   Time? Yes  Recall of three objects?  No  Can perform simple calculations? No, some difficulty  Displays appropriate judgment?yes  Can read the correct time from a watch face?Yes  General appearance: alert, no distress, WD/WN, African American female  Nutritional Status: Inadequate calore intake?  no Loss of muscle mass? no Loss of fat beneath skin? no Localized or general edema? no Diminished functional status? no  Other pertinent exam: HEENT: normocephalic, sclerae anicteric, TMs pearly, nares patent, no discharge or erythema, pharynx normal Oral cavity: MMM, no lesions Neck: supple, no lymphadenopathy, no thyromegaly, no masses, no bruits Heart: 3/6 brief holosystolic murmur heard best in upper sternal borders, othewrise RRR, normal s1, s2 Lungs: CTA bilaterally, no wheezes, rhonchi, or rales Abdomen: +bs, soft, abdominal surgical scars noted, non tender, non distended, no masses, no hepatomegaly, no splenomegaly Musculoskeletal: nontender, no swelling, no obvious deformity Extremities: no edema, no cyanosis, no clubbing Pulses: 2+ symmetric, upper and lower extremities, normal cap refill Neurological: alert, oriented x 3, CN2-12 intact, strength normal upper extremities and lower extremities, sensation normal throughout, DTRs 2+ throughout, no cerebellar signs, gait normal Psychiatric: normal affect, behavior normal, pleasant  Breast/pelvic - declined  Diabetic Foot Exam - Simple   Simple Foot Form Diabetic Foot exam was performed with the following findings: Yes 11/03/2019  8:50 AM  Visual Inspection No deformities, no ulcerations, no other skin breakdown bilaterally: Yes Sensation Testing Intact to touch and monofilament testing bilaterally: Yes Pulse Check See comments: Yes Comments       Assessment:   Encounter Diagnoses  Name Primary?  . Encounter for health maintenance examination in adult Yes  . Medicare annual wellness visit, subsequent   . Essential hypertension   . LBBB (left bundle branch block)   . Mild aortic stenosis by prior echocardiogram   . Varicose veins of both lower extremities, unspecified whether complicated   . Chronic venous insufficiency   . Allergic rhinitis due to pollen, unspecified seasonality   . Polyp of colon, unspecified part  of colon, unspecified type   . Diabetes mellitus with complication (HCC)   . Hypothyroidism, unspecified type   . Hyperlipidemia with target low density lipoprotein (  LDL) cholesterol less than 100 mg/dL   . Morbid obesity (HCC)   . Bilateral lower extremity edema   . Impaired cognitive perception pattern   . Noncompliance   . Need for influenza vaccination   . Encounter for screening mammogram for malignant neoplasm of breast   . Screen for STD (sexually transmitted disease)   . Vaccine counseling   . Estrogen deficiency   . Post-menopausal   . Unprotected sex   . Creatinine elevation   . Advance directive discussed with patient   . Weight gain   . Sore throat   . Hyperlipidemia associated with type 2 diabetes mellitus (HCC)   . Advanced directives, counseling/discussion      Plan:   A preventative services visit was completed today.  During the course of the visit today, we discussed and counseled about appropriate screening and preventive services.  A health risk assessment was established today that included a review of current medications, allergies, social history, family history, medical and preventative health history, biometrics, and preventative screenings to identify potential safety concerns or impairments.  A personalized plan was printed today for your records and use.   Personalized health advice and education was given today to reduce health risks and promote self management and wellness.  Information regarding end of life planning was discussed today.  Chronic problems discussed today: Diabetes-noncompliant with therapy.  Quit Ozempic on her own a few months ago.  Consider other options for therapy.  Labs today  Hypertension-she reports compliance with medication, blood pressure is elevated today  Weight gain-she is gained 30 pounds since earlier in the year.  Likely due to poor diet.  We counseled on the need to work on this  Hyperlipidemia-continue  statin  Edema uses Lasix as needed  STD screen today.  Discussed safe sex, condom use, encouraged her to use condoms.  Aortic stenosis-reviewed cardiology notes from January 2003 1.  Cardiology felt things are stable at the time, echocardiogram reviewed from January 2021.  She is due follow-up in January 2022  Acute problems discussed today: Irritated throat, exam normal, no obvious abnormality.  She seems to have a similar complaint like this every visit asking for antibiotics.  I am not sure why she obsesses about the specific need for antibiotics when there is no clear infection either time she is asked for this to last several visits  Recommendations:  I recommend a yearly ophthalmology/optometry visit for glaucoma screening and eye checkup  I recommended a yearly dental visit for hygiene and checkup  Advanced directives - discussed nature and purpose of Advanced Directives, encouraged them to complete them if they have not done so and/or encouraged them to get [place] a copy if they have done this already.   Referrals today: Possible referral back to Gastroenterology, Dr. Loreta Ave   Immunizations: Counseled on the influenza virus vaccine.  Vaccine information sheet given.   High dose Influenza vaccine given after consent obtained.  You are up-to-date on Shingrix, pneumococcal, Covid vaccine.  You will be due for Covid booster in November.    You will be due for tetanus booster 2022   Cancer screening: Due for colonoscopy now I believe, please check with Dr. Kenna Gilbert office Due for mammogram in November 2021 Last Pap smear 2019.  Plan to repeat 2022 with HPV    Kathryn [name] was seen today for medicare wellness.  Diagnoses and all orders for this visit:  Encounter for health maintenance examination in adult -     Comprehensive  metabolic panel -     CBC with Differential/Platelet -     Hemoglobin A1c -     Lipid panel -     TSH -     T4, free -     Microalbumin/Creatinine Ratio,  Urine -     HIV Antibody (routine testing w rflx) -     RPR -     GC/Chlamydia Probe Amp  Medicare annual wellness visit, subsequent  Essential hypertension  LBBB (left bundle branch block)  Mild aortic stenosis by prior echocardiogram  Varicose veins of both lower extremities, unspecified whether complicated  Chronic venous insufficiency  Allergic rhinitis due to pollen, unspecified seasonality  Polyp of colon, unspecified part of colon, unspecified type  Diabetes mellitus with complication (HCC) -     Hemoglobin A1c -     Microalbumin/Creatinine Ratio, Urine  Hypothyroidism, unspecified type -     TSH -     T4, free  Hyperlipidemia with target low density lipoprotein (LDL) cholesterol less than 100 mg/dL -     Lipid panel  Morbid obesity (HCC)  Bilateral lower extremity edema  Impaired cognitive perception pattern  Noncompliance  Need for influenza vaccination -     Flu Vaccine QUAD High Dose(Fluad)  Encounter for screening mammogram for malignant neoplasm of breast  Screen for STD (sexually transmitted disease) -     HIV Antibody (routine testing w rflx) -     RPR -     GC/Chlamydia Probe Amp  Vaccine counseling  Estrogen deficiency  Post-menopausal  Unprotected sex  Creatinine elevation  Advance directive discussed with patient  Weight gain  Sore throat  Hyperlipidemia associated with type 2 diabetes mellitus (HCC)  Advanced directives, counseling/discussion     Medicare Attestation A preventative services visit was completed today.  During the course of the visit the patient was educated and counseled about appropriate screening and preventive services.  A health risk assessment was established with the patient that included a review of current medications, allergies, social history, family history, medical and preventative health history, biometrics, and preventative screenings to identify potential safety concerns or impairments.  A  personalized plan was printed today for the patient's records and use.   Personalized health advice and education was given today to reduce health risks and promote self management and wellness.  Information regarding end of life planning was discussed today.  Kristian Covey, PA-C   11/03/2019

## 2019-11-03 ENCOUNTER — Other Ambulatory Visit: Payer: Self-pay | Admitting: Medical

## 2019-11-03 ENCOUNTER — Encounter: Payer: Self-pay | Admitting: Medical

## 2019-11-03 DIAGNOSIS — Z7189 Other specified counseling: Secondary | ICD-10-CM | POA: Insufficient documentation

## 2019-11-03 DIAGNOSIS — E118 Type 2 diabetes mellitus with unspecified complications: Secondary | ICD-10-CM

## 2019-11-03 LAB — CBC WITH DIFFERENTIAL/PLATELET
Basophils Absolute: 0 10*3/uL (ref 0.0–0.2)
Basos: 1 %
EOS (ABSOLUTE): 0.1 10*3/uL (ref 0.0–0.4)
Eos: 2 %
Hematocrit: 37 % (ref 34.0–46.6)
Hemoglobin: 12 g/dL (ref 11.1–15.9)
Immature Grans (Abs): 0 10*3/uL (ref 0.0–0.1)
Immature Granulocytes: 1 %
Lymphocytes Absolute: 2.2 10*3/uL (ref 0.7–3.1)
Lymphs: 43 %
MCH: 26.6 pg (ref 26.6–33.0)
MCHC: 32.4 g/dL (ref 31.5–35.7)
MCV: 82 fL (ref 79–97)
Monocytes Absolute: 0.5 10*3/uL (ref 0.1–0.9)
Monocytes: 9 %
Neutrophils Absolute: 2.3 10*3/uL (ref 1.4–7.0)
Neutrophils: 44 %
Platelets: 287 10*3/uL (ref 150–450)
RBC: 4.51 x10E6/uL (ref 3.77–5.28)
RDW: 13.6 % (ref 11.7–15.4)
WBC: 5.2 10*3/uL (ref 3.4–10.8)

## 2019-11-03 LAB — LIPID PANEL
Chol/HDL Ratio: 2.8 ratio (ref 0.0–4.4)
Cholesterol, Total: 121 mg/dL (ref 100–199)
HDL: 43 mg/dL (ref >39)
LDL Chol Calc (NIH): 65 mg/dL (ref 0–99)
Triglycerides: 63 mg/dL (ref 0–149)
VLDL Cholesterol Cal: 13 mg/dL (ref 5–40)

## 2019-11-03 LAB — HEMOGLOBIN A1C
Est. average glucose Bld gHb Est-mCnc: 148 mg/dL
Hgb A1c MFr Bld: 6.8 % — ABNORMAL HIGH (ref 4.8–5.6)

## 2019-11-03 LAB — COMPREHENSIVE METABOLIC PANEL
ALT: 28 IU/L (ref 0–32)
AST: 25 IU/L (ref 0–40)
Albumin/Globulin Ratio: 1.3 (ref 1.2–2.2)
Albumin: 4.3 g/dL (ref 3.8–4.8)
Alkaline Phosphatase: 99 IU/L (ref 48–121)
BUN/Creatinine Ratio: 10 — ABNORMAL LOW (ref 12–28)
BUN: 9 mg/dL (ref 8–27)
Bilirubin Total: 0.6 mg/dL (ref 0.0–1.2)
CO2: 25 mmol/L (ref 20–29)
Calcium: 9.8 mg/dL (ref 8.7–10.3)
Chloride: 103 mmol/L (ref 96–106)
Creatinine, Ser: 0.86 mg/dL (ref 0.57–1.00)
GFR calc Af Amer: 81 mL/min/{1.73_m2} (ref >59)
GFR calc non Af Amer: 70 mL/min/{1.73_m2} (ref >59)
Globulin, Total: 3.3 g/dL (ref 1.5–4.5)
Glucose: 98 mg/dL (ref 65–99)
Potassium: 4.7 mmol/L (ref 3.5–5.2)
Sodium: 141 mmol/L (ref 134–144)
Total Protein: 7.6 g/dL (ref 6.0–8.5)

## 2019-11-03 LAB — GC/CHLAMYDIA PROBE AMP
Chlamydia trachomatis, NAA: NEGATIVE
Neisseria Gonorrhoeae by PCR: NEGATIVE

## 2019-11-03 LAB — MICROALBUMIN / CREATININE URINE RATIO
Creatinine, Urine: 99.8 mg/dL
Microalb/Creat Ratio: <3 mg/g creat (ref 0–29)
Microalbumin, Urine: <3 ug/mL

## 2019-11-03 LAB — T4, FREE: Free T4: 1.32 ng/dL (ref 0.82–1.77)

## 2019-11-03 LAB — TSH: TSH: 0.587 u[IU]/mL (ref 0.450–4.500)

## 2019-11-03 LAB — RPR: RPR Ser Ql: NONREACTIVE

## 2019-11-03 LAB — HIV ANTIBODY (ROUTINE TESTING W REFLEX): HIV Screen 4th Generation wRfx: NONREACTIVE

## 2019-11-03 MED ORDER — BD PEN NEEDLE MINI U/F 31G X 5 MM MISC: 11 refills | Status: AC

## 2019-11-03 MED ORDER — ATORVASTATIN CALCIUM 20 MG PO TABS: 20.0000 mg | ORAL_TABLET | Freq: Every day | ORAL | 3 refills | Status: DC

## 2019-11-03 MED ORDER — RYBELSUS 3 MG PO TABS: 1.0000 | ORAL_TABLET | Freq: Every day | ORAL | 1 refills | Status: DC

## 2019-11-03 NOTE — Patient Instructions (Signed)
Chronic problems discussed today: Diabetes-pending labs we will call with recommendations  Hypertension-make sure you take your blood pressure medicine every day  Weight gain  Unfortunately has gained a lot of weight since last visit.  I strongly recommend you be careful with your eating, exercise every single day.  Hyperlipidemia-continue cholesterol medication  Edema  For both swelling and blood pressure I recommend you take the Lasix/furosemide and potassium daily  If you are sexually active, I recommend you use condoms every time.  Aortic stenosis  I reviewed your cardiology note from January 2021.  You are due back to see the heart doctor in January 2022   Recommendations:  I recommend a yearly ophthalmology/optometry visit for glaucoma screening and eye checkup  I recommended a yearly dental visit for hygiene and checkup   Advanced directives  If you have already completed a healthcare power of attorney and living will, please get [place] a copy of these records   Referrals today: Possible referral back to Gastroenterology, Dr. Loreta Ave   Immunizations: We updated your flu shot today  You are up-to-date on Shingrix, pneumococcal, Covid vaccine.  You will be due for Covid booster in November.    You will be due for tetanus booster 2022   Cancer screening: Due for colonoscopy now I believe, please check with Dr. Kenna Gilbert office Due for mammogram in November 2021 Last Pap smear 2019.  Plan to repeat 2022 with HPV

## 2019-11-17 ENCOUNTER — Other Ambulatory Visit: Payer: Self-pay | Admitting: Medical

## 2019-11-17 ENCOUNTER — Telehealth: Payer: Self-pay | Admitting: Medical

## 2019-11-17 MED ORDER — DOXYCYCLINE MONOHYDRATE 100 MG PO TABS: 100.0000 mg | ORAL_TABLET | Freq: Two times a day (BID) | ORAL | 0 refills | Status: DC

## 2019-11-17 NOTE — Telephone Encounter (Signed)
Pt called and states that at last appt she discussed her occasional sore throat. She states she requested antibiotics be sent in for that. She states that medication was never sent in. Pt uses [facility] and can be reached at [phone].

## 2019-11-17 NOTE — Telephone Encounter (Signed)
done

## 2019-12-02 ENCOUNTER — Other Ambulatory Visit: Payer: Self-pay | Admitting: Medical

## 2019-12-05 ENCOUNTER — Other Ambulatory Visit: Payer: Self-pay | Admitting: Medical

## 2019-12-20 ENCOUNTER — Ambulatory Visit: Payer: 59

## 2019-12-20 ENCOUNTER — Other Ambulatory Visit: Payer: Self-pay

## 2019-12-21 ENCOUNTER — Other Ambulatory Visit: Payer: Self-pay

## 2019-12-21 ENCOUNTER — Ambulatory Visit (INDEPENDENT_AMBULATORY_CARE_PROVIDER_SITE_OTHER): Payer: 59

## 2019-12-21 DIAGNOSIS — Z23 Encounter for immunization: Secondary | ICD-10-CM

## 2020-01-01 ENCOUNTER — Other Ambulatory Visit: Payer: Self-pay

## 2020-01-01 ENCOUNTER — Ambulatory Visit (INDEPENDENT_AMBULATORY_CARE_PROVIDER_SITE_OTHER): Payer: 59 | Admitting: Medical

## 2020-01-01 ENCOUNTER — Encounter: Payer: Self-pay | Admitting: Medical

## 2020-01-01 VITALS — BP 124/76 | HR 76 | Temp 98.9°F | Wt 263.4 lb

## 2020-01-01 DIAGNOSIS — B379 Candidiasis, unspecified: Secondary | ICD-10-CM

## 2020-01-01 DIAGNOSIS — E785 Hyperlipidemia, unspecified: Secondary | ICD-10-CM

## 2020-01-01 DIAGNOSIS — I872 Venous insufficiency (chronic) (peripheral): Secondary | ICD-10-CM

## 2020-01-01 DIAGNOSIS — B356 Tinea cruris: Secondary | ICD-10-CM

## 2020-01-01 DIAGNOSIS — Z6841 Body Mass Index (BMI) 40.0 and over, adult: Secondary | ICD-10-CM

## 2020-01-01 DIAGNOSIS — I10 Essential (primary) hypertension: Secondary | ICD-10-CM

## 2020-01-01 DIAGNOSIS — E1169 Type 2 diabetes mellitus with other specified complication: Secondary | ICD-10-CM

## 2020-01-01 LAB — POCT WET PREP (WET MOUNT)

## 2020-01-01 MED ORDER — FLUCONAZOLE 100 MG PO TABS: 100.0000 mg | ORAL_TABLET | Freq: Every day | ORAL | 1 refills | Status: DC

## 2020-01-01 MED ORDER — TERBINAFINE HCL 1 % EX CREA: 1.0000 "application " | TOPICAL_CREAM | Freq: Two times a day (BID) | CUTANEOUS | 0 refills | Status: DC

## 2020-01-01 NOTE — Progress Notes (Signed)
Referral sent 

## 2020-01-01 NOTE — Progress Notes (Signed)
Subjective:  Kathryn [name] is a 69 y.o. female who presents for Chief Complaint  Patient presents with  . other    vaginal itching, for two weeks, frequency with urinatio,      Here for rash and vaginal itching.  X 2 weeks notes rash all along privates and in upper thighs in folds.   Is very itchy.  Using powders to treat OTC.   Has some vaginal itching as well.  No fever, no abdominal pain, no vaginal discharge, no vaginal bleeding.  Has some urinary frequency but no burning, no blood in urine, no urine or vaginal odor.  No new sexual partners, no recent sexual activity.  Also wants referral to bariatric surgery for consult.   Has had problems losing weight for years.    Has ongoing problems trying to lose weight.  Trying to eat health, trying to get exercise.    No other aggravating or relieving factors.    No other c/o.  The following portions of the patient's history were reviewed and updated as appropriate: allergies, current medications, past family history, past medical history, past social history, past surgical history and problem list.  ROS Otherwise as in subjective above   Objective: BP 124/76   Pulse 76   Temp 98.9 F (37.2 C)   Wt 263 lb 6.4 oz (119.5 kg)   LMP  (LMP Unknown)   BMI 41.25 kg/m   General appearance: alert, no distress, well developed, well nourished Obese African American female GU: diffuse erythematous rash and intertriginous rash in areas of inner upper thighs, on mons pubis, labia. Exam chaperoned by nurse     Assessment: Encounter Diagnoses  Name Primary?  . Tinea cruris Yes  . Yeast infection   . BMI 40.0-44.9, adult (HCC)   . Essential hypertension   . Chronic venous insufficiency   . Hyperlipidemia with target low density lipoprotein (LDL) cholesterol less than 100 mg/dL   . Type 2 diabetes mellitus with other specified complication, unspecified whether long term insulin use (HCC)      Plan: Tinea, yeast infection - begin  diflucan daily x 7 days along with topical antifungal.  Call or recheck if not completely resolved within 10 days  Referral to bariatric clinic for consideration of weight loss.   She has had difficulty losing weight, has several comorbidities.  C/t efforts with exercise, healthy low fat diet.     Kathryn [name] was seen today for other.  Diagnoses and all orders for this visit:  Tinea cruris  Yeast infection -     POCT Wet Prep ([place])  BMI 40.0-44.9, adult (HCC) -     Amb Referral to Bariatric Surgery  Essential hypertension -     Amb Referral to Bariatric Surgery  Chronic venous insufficiency -     Amb Referral to Bariatric Surgery  Hyperlipidemia with target low density lipoprotein (LDL) cholesterol less than 100 mg/dL -     Amb Referral to Bariatric Surgery  Type 2 diabetes mellitus with other specified complication, unspecified whether long term insulin use (HCC) -     Amb Referral to Bariatric Surgery  Other orders -     fluconazole (DIFLUCAN) 100 MG tablet; Take 1 tablet (100 mg total) by mouth daily. -     terbinafine (LAMISIL AT) 1 % cream; Apply 1 application topically 2 (two) times daily.    Follow up: prn

## 2020-02-05 ENCOUNTER — Other Ambulatory Visit: Payer: Self-pay

## 2020-02-05 ENCOUNTER — Telehealth (INDEPENDENT_AMBULATORY_CARE_PROVIDER_SITE_OTHER): Payer: 59 | Admitting: Medical

## 2020-02-05 ENCOUNTER — Encounter: Payer: Self-pay | Admitting: Medical

## 2020-02-05 VITALS — Temp 99.0°F | Wt 260.0 lb

## 2020-02-05 DIAGNOSIS — R52 Pain, unspecified: Secondary | ICD-10-CM

## 2020-02-05 DIAGNOSIS — J029 Acute pharyngitis, unspecified: Secondary | ICD-10-CM

## 2020-02-05 DIAGNOSIS — R059 Cough, unspecified: Secondary | ICD-10-CM | POA: Diagnosis not present

## 2020-02-05 MED ORDER — GUAIFENESIN ER 600 MG PO TB12: 600.0000 mg | ORAL_TABLET | Freq: Two times a day (BID) | ORAL | 0 refills | Status: DC

## 2020-02-05 MED ORDER — PROMETHAZINE-DM 6.25-15 MG/5ML PO SYRP: 5.0000 mL | ORAL_SOLUTION | Freq: Four times a day (QID) | ORAL | 0 refills | Status: DC | PRN

## 2020-02-05 MED ORDER — EMERGEN-C IMMUNE PLUS PO PACK: 1.0000 | PACK | Freq: Two times a day (BID) | ORAL | 0 refills | Status: DC

## 2020-02-05 NOTE — Progress Notes (Signed)
Pt. Scheduled tomorrow at 2:30 for covid test

## 2020-02-05 NOTE — Progress Notes (Signed)
Subjective:     Patient ID: Kathryn [name], female   DOB: 08-Feb-1951, 69 y.o.   MRN: [id number]  This visit type was conducted due to national recommendations for restrictions regarding the COVID-19 Pandemic (e.g. social distancing) in an effort to limit this patient's exposure and mitigate transmission in our community.  Due to their co-morbid illnesses, this patient is at least at moderate risk for complications without adequate follow up.  This format is felt to be most appropriate for this patient at this time.    Documentation for virtual audio and video telecommunications through [place] encounter:  The patient was located at home. The provider was located in the office. The patient did consent to this visit and is aware of possible charges through their insurance for this visit.  The other persons participating in this telemedicine service were none. Time spent on call was 20 minutes and in review of previous records 20 minutes total.  This virtual service is not related to other E/M service within previous 7 days.   HPI Chief Complaint  Patient presents with  . other    Cough, ST, had fever on few days ago, cold chills, and loss of taste, vomiting one time   Virtual consult for now feeling well.  She notes 3 day hx/o cough, sore throat, loss of taste, fever, chills.  She notes body aches.  No sick contacts.  Has had nausea, but no vomiting, no diarrhea.  Has felt some wheezing in nose but not in chest, and no SOB.   Using Advil.  No recent covid test.  No other aggravating or relieving factors. No other complaint.  Past Medical History:  Diagnosis Date  . Anemia   . Aortic stenosis 2010   Echo 04/2008:  EF 60%, mean AV gradient 7 mmHg.-Remains mild AS by echo in May 2018.  There appears to be a subvalvular component with hyperdynamic LV function.  . Diabetes mellitus   . Diverticula of colon    per colonoscopy6/20/11  . Edema of lower extremity    wears ted hose  .  H/O echocardiogram 2014   EF 65%, slight gradient of aortic valve, no major findings otherwise  . Hx of cardiovascular stress test    Adenosine Myoview 04/2008:  Normal.  . Hyperlipidemia   . Hypertension   . Hypothyroidism   . LBBB (left bundle branch block)   . Obesity   . 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 Tubulovillous adenoma   . Wears dentures    upper  . Wears glasses    Current Outpatient Medications on File Prior to Visit  Medication Sig Dispense Refill  . atorvastatin (LIPITOR) 20 MG tablet Take 1 tablet (20 mg total) by mouth daily. 90 tablet 3  . cetirizine (ZYRTEC) 10 MG tablet Take 1 tablet (10 mg total) by mouth at bedtime. 90 tablet 3  . cholecalciferol (VITAMIN D3) 25 MCG (1000 UNIT) tablet Take 1 tablet (1,000 Units total) by mouth daily. 90 tablet 3  . Emollient (LUBRIDERM DAILY MOISTURE) LOTN Apply 1 application topically daily. 473 mL 2  . fluticasone (FLONASE) 50 MCG/ACT nasal spray Place 2 sprays into both nostrils daily. 16 g 11  . furosemide (LASIX) 20 MG tablet TAKE 1 TABLET BY MOUTH EVERY DAY 90 tablet 1  . Insulin Pen Needle (B-D UF III MINI PEN NEEDLES) 31G X 5 MM MISC 1 EACH BY DOES NOT APPLY ROUTE AS DIRECTED. 100 each 11  .  levothyroxine (SYNTHROID) 75 MCG tablet Take 1 tablet (75 mcg total) by mouth daily. 90 tablet 3  . lisinopril-hydrochlorothiazide (ZESTORETIC) 20-12.5 MG tablet Take 1 tablet by mouth daily. 90 tablet 3  . potassium chloride (KLOR-CON) 10 MEQ tablet TAKE 1 TABLET BY MOUTH EVERY DAY 90 tablet 0  . Semaglutide (RYBELSUS) 3 MG TABS Take 1 tablet by mouth daily. 30 tablet 1  . fluconazole (DIFLUCAN) 100 MG tablet Take 1 tablet (100 mg total) by mouth daily. (Patient not taking: Reported on 02/05/2020) 7 tablet 1  . terbinafine (LAMISIL AT) 1 % cream Apply 1 application topically 2 (two) times daily. (Patient not taking: Reported on 02/05/2020) 30 g 0   No current facility-administered medications  on file prior to visit.    Review of Systems As in subjective    Objective:   Physical Exam Due to coronavirus pandemic stay at home measures, patient visit was virtual and they were not examined in person.   Temp 99 F (37.2 C)   Wt 260 lb (117.9 kg)   LMP  (LMP Unknown)   BMI 40.72 kg/m       Assessment:     Encounter Diagnoses  Name Primary?  . Cough Yes  . Sore throat   . Body aches        Plan:      We discussed her symptoms and concerns.  She will come in tomorrow for Covid testing in the back parking lot.  In the meantime begin medication as below, rest, hydrate well, quarantine as discussed.  We discussed possible complications.  We discussed that if she test positive only other treatment recommendations including monoclonal antibody treatment.  [name] was seen today for other.  Diagnoses and all orders for this visit:  Cough  Sore throat  Body aches  Other orders -     Multiple Vitamins-Minerals (EMERGEN-C IMMUNE PLUS) PACK; Take 1 tablet by mouth 2 (two) times daily. -     promethazine-dextromethorphan (PROMETHAZINE-DM) 6.25-15 MG/5ML syrup; Take 5 mLs by mouth 4 (four) times daily as needed for cough. -     guaiFENesin (MUCINEX) 600 MG 12 hr tablet; Take 1 tablet (600 mg total) by mouth 2 (two) times daily.   Return tomorrow for covid testing.

## 2020-02-06 ENCOUNTER — Other Ambulatory Visit (INDEPENDENT_AMBULATORY_CARE_PROVIDER_SITE_OTHER): Payer: 59

## 2020-02-06 DIAGNOSIS — R52 Pain, unspecified: Secondary | ICD-10-CM | POA: Diagnosis not present

## 2020-02-06 DIAGNOSIS — J029 Acute pharyngitis, unspecified: Secondary | ICD-10-CM | POA: Diagnosis not present

## 2020-02-06 DIAGNOSIS — R059 Cough, unspecified: Secondary | ICD-10-CM | POA: Diagnosis not present

## 2020-02-06 LAB — POC COVID19 BINAXNOW: SARS Coronavirus 2 Ag: NEGATIVE

## 2020-02-08 LAB — NOVEL CORONAVIRUS, NAA: SARS-CoV-2, NAA: NOT DETECTED

## 2020-02-08 LAB — SARS-COV-2, NAA 2 DAY TAT

## 2020-02-09 ENCOUNTER — Telehealth: Payer: Self-pay | Admitting: Medical

## 2020-02-09 NOTE — Telephone Encounter (Signed)
Pt called and states that she is still coughing really bad and has the sore throat  She wants to know if you will send her something in  Pt uses [id number] - [place], [place] - [address]  Please send back to someone else as I will not be in this afternoon I leave at 2.30

## 2020-02-09 NOTE — Telephone Encounter (Signed)
I sent 3 medicines  the other day to help with her symptoms.  She should continue salt water gargles warm fluids, Chloraseptic spray, Tylenol and I give it more time  If she saying the cough medicine is not working or what specifically this you feel needs to be sent in

## 2020-02-09 NOTE — Telephone Encounter (Signed)
Patient has not been taking cough medicine. Patient was informed that it was sent to the pharmacy for her.

## 2020-02-28 ENCOUNTER — Other Ambulatory Visit: Payer: Self-pay | Admitting: Medical

## 2020-03-19 ENCOUNTER — Telehealth: Payer: Self-pay | Admitting: Medical

## 2020-03-19 NOTE — Telephone Encounter (Signed)
Please see the [facility] housecall summary  There is a comment on the back page about financial needs, can pay for medications or transportation.  I cannot tell if [facility] is doing a referral to Child psychotherapist or if we need to see if we can help in some way with social work.    Can you give me some feedback on this

## 2020-03-20 NOTE — Telephone Encounter (Signed)
Thanks

## 2020-03-20 NOTE — Telephone Encounter (Signed)
I called housecalls and spoke with Kathlene November.  He stated that they are handling the referral to their social worker for transportation and needs for daily living.  I requested they put that info on the report.

## 2020-05-20 ENCOUNTER — Other Ambulatory Visit: Payer: Self-pay | Admitting: Medical

## 2020-05-21 ENCOUNTER — Other Ambulatory Visit: Payer: Self-pay | Admitting: Medical

## 2020-07-23 ENCOUNTER — Other Ambulatory Visit: Payer: Self-pay

## 2020-07-24 ENCOUNTER — Telehealth (INDEPENDENT_AMBULATORY_CARE_PROVIDER_SITE_OTHER): Payer: 59 | Admitting: Family Medicine

## 2020-07-24 ENCOUNTER — Encounter: Payer: Self-pay | Admitting: Family Medicine

## 2020-07-24 VITALS — BP 120/70 | HR 68 | Temp 99.3°F | Ht 67.0 in | Wt 240.0 lb

## 2020-07-24 DIAGNOSIS — K120 Recurrent oral aphthae: Secondary | ICD-10-CM | POA: Diagnosis not present

## 2020-07-24 MED ORDER — LIDOCAINE VISCOUS HCL 2 % MT SOLN: OROMUCOSAL | 0 refills | Status: DC

## 2020-07-24 NOTE — Progress Notes (Signed)
Visit started as telephone call (driving, unable to do video, and didn't know how). Unable to get a clear history, and not on MyChart to send photos. Patient ultimately came into the office for a visit.  Chief Complaint  Patient presents with  . Sore Throat    VIRTUAL sore throat, having a hard time eating. Throat feels raw-started 2 days. No other symptoms. No home covid tests done.    History received from telephone visit: 2 days ago she started with raw-feeling throat. Her mouth hurts--back of her throat.  She thinks her throat looks a little red.  Has pain under her tongue, hurts at the "gums" Pain with hot or spicy food, salty foods, feels raw.   She can't eat anything, due to apin, she can only eat soft foods or liquids. Denies pain with swallowing. Her tongue hurts a little also, she noticed a bump. There is a sore beside her gums.  No history of canker sores or mouth problems.  She has dentures.  She has h/o allergies, with some runny nose.  She uses flonase regularly, and used theraflu last night.   PMH, PSH, SH reviewed-- She is a diabetic, last seen in 10/2019, and not scheduled to be seen again until 10/2020. Lab Results  Component Value Date   HGBA1C 6.8 (H) 11/02/2019    Outpatient Encounter Medications as of 07/24/2020  Medication Sig Note  . atorvastatin (LIPITOR) 20 MG tablet Take 1 tablet (20 mg total) by mouth daily.   . Chlorphen-Pseudoephed-APAP (THERAFLU FLU/COLD PO) Take 2 tablets by mouth as needed. 07/24/2020: Last dose last night.  . cholecalciferol (VITAMIN D3) 25 MCG (1000 UNIT) tablet Take 1 tablet (1,000 Units total) by mouth daily.   . fluticasone (FLONASE) 50 MCG/ACT nasal spray Place 2 sprays into both nostrils daily.   . furosemide (LASIX) 20 MG tablet TAKE 1 TABLET BY MOUTH EVERY DAY   . Insulin Pen Needle (B-D UF III MINI PEN NEEDLES) 31G X 5 MM MISC 1 EACH BY DOES NOT APPLY ROUTE AS DIRECTED.   Marland Kitchen levothyroxine (SYNTHROID) 75 MCG tablet Take 1 tablet  (75 mcg total) by mouth daily.   Marland Kitchen lisinopril-hydrochlorothiazide (ZESTORETIC) 20-12.5 MG tablet TAKE 1 TABLET BY MOUTH EVERY DAY   . potassium chloride (KLOR-CON) 10 MEQ tablet TAKE 1 TABLET BY MOUTH EVERY DAY   . [DISCONTINUED] cetirizine (ZYRTEC) 10 MG tablet Take 1 tablet (10 mg total) by mouth at bedtime.   . Semaglutide (RYBELSUS) 3 MG TABS Take 1 tablet by mouth daily. (Patient not taking: Reported on 07/24/2020)   . terbinafine (LAMISIL AT) 1 % cream Apply 1 application topically 2 (two) times daily. (Patient not taking: No sig reported)   . [DISCONTINUED] Emollient (LUBRIDERM DAILY MOISTURE) LOTN Apply 1 application topically daily.   . [DISCONTINUED] fluconazole (DIFLUCAN) 100 MG tablet Take 1 tablet (100 mg total) by mouth daily. (Patient not taking: Reported on 02/05/2020)   . [DISCONTINUED] guaiFENesin (MUCINEX) 600 MG 12 hr tablet Take 1 tablet (600 mg total) by mouth 2 (two) times daily.   . [DISCONTINUED] Multiple Vitamins-Minerals (EMERGEN-C IMMUNE PLUS) PACK Take 1 tablet by mouth 2 (two) times daily.   . [DISCONTINUED] promethazine-dextromethorphan (PROMETHAZINE-DM) 6.25-15 MG/5ML syrup Take 5 mLs by mouth 4 (four) times daily as needed for cough.    No facility-administered encounter medications on file as of 07/24/2020.   Allergies  Allergen Reactions  . Codeine    ROS: no fever, chils. Slight headache. No nausea, vomiting, diarrhea, no rashes,  bleeding, bruising. Some runny nose, chronic (allergies). Denies cough, shortness of breath or chest pain.   Physical Exam:  BP 120/70 Comment: no cuff  Pulse 68   Temp 99.3 F (37.4 C) (Tympanic) Comment: no thermometer  Ht 5\' 7"  (1.702 m)   Wt 240 lb (108.9 kg)   LMP  (LMP Unknown)   BMI 37.59 kg/m   Well-appearing, obese female in no distress.  She is somewhat anxious/upset about the pain and that she can't eat. HEENT: conjunctiva and sclera are clear, EOMI, wearing glasses. TM's and EACs are normal. OP: posterior  oropharynx is clear, no lesions or erythema. She has upper and lower dentures. She has 2 small ulcerations on the right buccal mucosa, near the posterior teeth. Neck: no lymphadenopathy or mass Heart: regular rate and rhythm, 3/6 SEM, heard loudest at RUSB (known, per pt). Lungs: clear bilaterally Neuro: alert and oriented, normal gait   Assessment and Plan:  Oral aphthous ulcer - Plan: lidocaine (XYLOCAINE) 2 % solution   Discussed diagnosis, various treatment options-- TAC paste vs lidocaine, prefers lidocaine.  DM, past due for med check.  Scheduled for CPE in 10/2020

## 2020-07-24 NOTE — Patient Instructions (Addendum)
Use a q-tip to put the lidocaine on the painful area of your cheek.  You may use it every 4 hours as needed for pain.  I encourage you to use it prior to eating (don't swish around the mouth prior to eating, only use a q-tip; if you use more medicine around the whole mouth, you could numb the throat and be at risk for choking on your food).  Use tylenol as well, if needed for pain.    Canker Sores  Canker sores are small, painful sores that develop inside your mouth. You can get one or more canker sores on the inside of your lips or cheeks, on your tongue, or anywhere inside your mouth. Canker sores cannot be passed from person to person (are not contagious). These sores are different from the sores that you may get on the outside of your lips (cold sores or fever blisters). What are the causes? The cause of this condition is not known. The condition may be passed down from a parent (genetic). What increases the risk? This condition is more likely to develop in:  Women.  People in their teens or 57s.  Women who are having their menstrual period.  People who are under a lot of emotional stress.  People who do not get enough iron or B vitamins.  People who do not take care of their mouth and teeth (have poor oral hygiene).  People who have an injury inside the mouth, such as after having dental work or from chewing something hard. What are the signs or symptoms? Canker sores usually start as painful red bumps. Then they turn into small white, yellow, or gray sores that have red borders. The sores may be painful, and the pain may get worse when you eat or drink. Along with the canker sore, symptoms may also include:  Fever.  Fatigue.  Swollen lymph nodes in your neck. How is this diagnosed? This condition may be diagnosed based on your symptoms and an exam of the inside of your mouth. If you get canker sores often or if they are very bad, you may have tests, such as:  Blood tests  to rule out possible causes.  Swabbing a fluid sample from the sore to be tested for infection.  Removing a small tissue sample from the sore (biopsy) to test it for cancer. How is this treated? Most canker sores go away without treatment in about 1 week. Home care is usually the only treatment that you will need. Over-the-counter medicines can relieve discomfort. If you have severe canker sores, your health care provider may prescribe:  Numbing ointment to relieve pain. ? Do not use numbing gels or products containing benzocaine in children who are 74 years of age or younger.  Vitamins.  Steroid medicines. These may be given as pills, mouth rinses, or gels.  Antibiotic mouth rinse. Follow these instructions at home:  Apply, take, or use over-the-counter and prescription medicines only as told by your health care provider. These include vitamins and ointments.  If you were prescribed an antibiotic mouth rinse, use it as told by your health care provider. Do not stop using the antibiotic even if your condition improves.  Until the sores are healed: ? Do not drink coffee or citrus juices. ? Do not eat spicy or salty foods.  Use a mild, over-the-counter mouth rinse as recommended by your health care provider.  Practice good oral hygiene by: ? Flossing your teeth every day. ? Brushing your teeth  with a soft toothbrush twice each day.   Contact a health care provider if:  Your symptoms do not get better after 2 weeks.  You also have a fever or swollen glands in your neck.  You get canker sores often.  You have a canker sore that is getting larger.  You cannot eat or drink due to your canker sores. Summary  Canker sores are small, painful sores that develop inside your mouth.  Canker sores usually start as painful red bumps that turn into small white, yellow, or gray sores that have red borders.  The sores may be quite painful, and the pain may get worse when you eat or  drink.  Most canker sores clear up without treatment in about 1 week. Over-the-counter medicines can relieve discomfort. This information is not intended to replace advice given to you by your health care provider. Make sure you discuss any questions you have with your health care provider. Document Revised: 11/03/2019 Document Reviewed: 11/03/2019 Elsevier Patient Education  2021 [employer].

## 2020-07-25 NOTE — Progress Notes (Signed)
Got pt scheduled 

## 2020-08-19 LAB — HM DIABETES EYE EXAM

## 2020-08-21 ENCOUNTER — Ambulatory Visit: Payer: 59 | Admitting: Family Medicine

## 2020-08-22 ENCOUNTER — Other Ambulatory Visit: Payer: Self-pay | Admitting: Medical

## 2020-08-22 NOTE — Telephone Encounter (Signed)
Pt has cpe in october

## 2020-08-26 ENCOUNTER — Other Ambulatory Visit: Payer: Self-pay | Admitting: Medical

## 2020-09-02 ENCOUNTER — Ambulatory Visit (INDEPENDENT_AMBULATORY_CARE_PROVIDER_SITE_OTHER): Payer: 59 | Admitting: Medical

## 2020-09-02 ENCOUNTER — Other Ambulatory Visit: Payer: Self-pay

## 2020-09-02 VITALS — BP 120/72 | HR 63 | Wt 240.8 lb

## 2020-09-02 DIAGNOSIS — Z23 Encounter for immunization: Secondary | ICD-10-CM | POA: Insufficient documentation

## 2020-09-02 DIAGNOSIS — E1169 Type 2 diabetes mellitus with other specified complication: Secondary | ICD-10-CM

## 2020-09-02 DIAGNOSIS — E785 Hyperlipidemia, unspecified: Secondary | ICD-10-CM

## 2020-09-02 DIAGNOSIS — E039 Hypothyroidism, unspecified: Secondary | ICD-10-CM

## 2020-09-02 DIAGNOSIS — I10 Essential (primary) hypertension: Secondary | ICD-10-CM | POA: Diagnosis not present

## 2020-09-02 DIAGNOSIS — E118 Type 2 diabetes mellitus with unspecified complications: Secondary | ICD-10-CM | POA: Insufficient documentation

## 2020-09-02 NOTE — Progress Notes (Signed)
Subjective:  Kathryn [name] is a 70 y.o. female who presents for Chief Complaint  Patient presents with   Diabetes    Fasting diabetes would like 4th booster     Medical team: Dr. Loreta Ave, GI Dr. Herbie [place] cardio  Here for med check  Diabetes - she notes checking sugars some, no highs or lows, "pretty normal" readings.   No foot concerns, no polydipsia, no polyuria, no vision changes.  She notes running out of rybelsus a few weeks ago  Hyuperlipidemai - she reports compliance with Lipitor without complaint  Hypothyroidism - compliant with medicaiton.  Takes on empty stomach  Hypertension - compliant with medication  No other aggravating or relieving factors.    No other c/o.  Past Medical History:  Diagnosis Date   Anemia    Aortic stenosis 2010   Echo 04/2008:  EF 60%, mean AV gradient 7 mmHg.-Remains mild AS by echo in May 2018.  There appears to be a subvalvular component with hyperdynamic LV function.   Diabetes mellitus    Diverticula of colon    per colonoscopy6/20/11   Edema of lower extremity    wears ted hose   H/O echocardiogram 2014   EF 65%, slight gradient of aortic valve, no major findings otherwise   Hx of cardiovascular stress test    Adenosine Myoview 04/2008:  Normal.   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 90 tablet 3   cholecalciferol (VITAMIN D3) 25 MCG (1000 UNIT) tablet Take 1 tablet (1,000 Units total) by mouth daily. 90 tablet 3   fluticasone (FLONASE) 50 MCG/ACT nasal spray Place 2 sprays into both nostrils daily. 16 g 11   furosemide (LASIX) 20 MG tablet TAKE 1 TABLET BY  MOUTH EVERY DAY 90 tablet 0   Insulin Pen Needle (B-D UF III MINI PEN NEEDLES) 31G X 5 MM MISC 1 EACH BY DOES NOT APPLY ROUTE AS DIRECTED. 100 each 11   lidocaine (XYLOCAINE) 2 % solution Apply solution to the sores in the mouth every 4 hours as needed for pain 30 mL 0   lisinopril-hydrochlorothiazide (ZESTORETIC) 20-12.5 MG tablet TAKE 1 TABLET BY MOUTH EVERY DAY 90 tablet 0   potassium chloride (KLOR-CON) 10 MEQ tablet TAKE 1 TABLET BY MOUTH EVERY DAY 90 tablet 0   Semaglutide (RYBELSUS) 3 MG TABS Take 1 tablet by mouth daily. 30 tablet 1   levothyroxine (SYNTHROID) 75 MCG tablet Take 1 tablet (75 mcg total) by mouth daily. (Patient not taking: Reported on 09/02/2020) 90 tablet 3   No current facility-administered medications on file prior to visit.     The following portions of the patient's history were reviewed and updated as appropriate: allergies, current medications, past family history, past medical history, past social history, past surgical history and problem list.  ROS Otherwise as in subjective above  Objective: BP 120/72   Pulse 63   Wt 240 lb 12.8 oz (109.2 kg)   LMP  (LMP Unknown)   SpO2 99%   BMI 37.71 kg/m   General appearance: alert, no  distress, well developed, well nourished Oral: upper and lower dentures Neck: supple, no lymphadenopathy, no thyromegaly, no masses, no bruits Heart: RRR, normal S1, S2,2+ holosystolic murmurs Lungs: CTA bilaterally, no wheezes, rhonchi, or rales Pulses: 2+ radial pulses, 2+ pedal pulses, normal cap refill Ext: no edema     Assessment: Encounter Diagnoses  Name Primary?   Diabetes mellitus type 2 with complications (HCC) Yes   Hyperlipidemia associated with type 2 diabetes mellitus (HCC)    Essential hypertension    Need for COVID-19 vaccine    Hypothyroidism, unspecified type      Plan: Diabetes - labs today, c/t plan to see eye doctor soon, c/t daily foot checks, routine med check follow up here  Hyperlipidemia -  c/t statin, healthy low cholesterol diet  HTN - c/t current medication  Hypothyroidism - labs today, c/t current medicaiton  Counseled on the Covid virus vaccine.  Vaccine information sheet given.  Covid vaccine given after consent obtained.   [name] was seen today for diabetes.  Diagnoses and all orders for this visit:  Diabetes mellitus type 2 with complications (HCC) -     Comprehensive metabolic panel -     Hemoglobin A1c  Hyperlipidemia associated with type 2 diabetes mellitus (HCC)  Essential hypertension  Need for COVID-19 vaccine -     PFIZER Comirnaty(GRAY TOP)COVID-19 Vaccine  Hypothyroidism, unspecified type -     TSH   Follow up: pending labs

## 2020-09-02 NOTE — Patient Instructions (Signed)
Please review your medication list below with your medications at home to make sure they seem correct  Current Outpatient Medications on File Prior to Visit  Medication Sig Dispense Refill   atorvastatin (LIPITOR) 20 MG tablet Take 1 tablet (20 mg total) by mouth daily. 90 tablet 3   cholecalciferol (VITAMIN D3) 25 MCG (1000 UNIT) tablet Take 1 tablet (1,000 Units total) by mouth daily. 90 tablet 3   fluticasone (FLONASE) 50 MCG/ACT nasal spray Place 2 sprays into both nostrils daily. 16 g 11   furosemide (LASIX) 20 MG tablet TAKE 1 TABLET BY MOUTH EVERY DAY 90 tablet 0   Insulin Pen Needle (B-D UF III MINI PEN NEEDLES) 31G X 5 MM MISC 1 EACH BY DOES NOT APPLY ROUTE AS DIRECTED. 100 each 11   lidocaine (XYLOCAINE) 2 % solution Apply solution to the sores in the mouth every 4 hours as needed for pain 30 mL 0   lisinopril-hydrochlorothiazide (ZESTORETIC) 20-12.5 MG tablet TAKE 1 TABLET BY MOUTH EVERY DAY 90 tablet 0   potassium chloride (KLOR-CON) 10 MEQ tablet TAKE 1 TABLET BY MOUTH EVERY DAY 90 tablet 0   Semaglutide (RYBELSUS) 3 MG TABS Take 1 tablet by mouth daily. 30 tablet 1   levothyroxine (SYNTHROID) 75 MCG tablet Take 1 tablet (75 mcg total) by mouth daily. (Patient not taking: Reported on 09/02/2020) 90 tablet 3   No current facility-administered medications on file prior to visit.

## 2020-09-03 ENCOUNTER — Other Ambulatory Visit: Payer: Self-pay | Admitting: Medical

## 2020-09-03 ENCOUNTER — Other Ambulatory Visit: Payer: Self-pay | Admitting: Internal Medicine

## 2020-09-03 LAB — COMPREHENSIVE METABOLIC PANEL
ALT: 19 IU/L (ref 0–32)
AST: 22 IU/L (ref 0–40)
Albumin/Globulin Ratio: 1.4 (ref 1.2–2.2)
Albumin: 4.3 g/dL (ref 3.8–4.8)
Alkaline Phosphatase: 99 IU/L (ref 44–121)
BUN/Creatinine Ratio: 9 — ABNORMAL LOW (ref 12–28)
BUN: 9 mg/dL (ref 8–27)
Bilirubin Total: 0.4 mg/dL (ref 0.0–1.2)
CO2: 24 mmol/L (ref 20–29)
Calcium: 9.7 mg/dL (ref 8.7–10.3)
Chloride: 100 mmol/L (ref 96–106)
Creatinine, Ser: 1.04 mg/dL — ABNORMAL HIGH (ref 0.57–1.00)
Globulin, Total: 3.1 g/dL (ref 1.5–4.5)
Glucose: 81 mg/dL (ref 65–99)
Potassium: 4 mmol/L (ref 3.5–5.2)
Sodium: 136 mmol/L (ref 134–144)
Total Protein: 7.4 g/dL (ref 6.0–8.5)
eGFR: 59 mL/min/{1.73_m2} — ABNORMAL LOW (ref >59)

## 2020-09-03 LAB — HEMOGLOBIN A1C
Est. average glucose Bld gHb Est-mCnc: 131 mg/dL
Hgb A1c MFr Bld: 6.2 % — ABNORMAL HIGH (ref 4.8–5.6)

## 2020-09-03 LAB — TSH: TSH: 4.86 u[IU]/mL — ABNORMAL HIGH (ref 0.450–4.500)

## 2020-09-03 MED ORDER — LEVOTHYROXINE SODIUM 75 MCG PO TABS: 75.0000 ug | ORAL_TABLET | Freq: Every day | ORAL | 0 refills | Status: DC

## 2020-09-03 MED ORDER — VITAMIN D 25 MCG (1000 UNIT) PO TABS: 1000.0000 [IU] | ORAL_TABLET | Freq: Every day | ORAL | 1 refills | Status: DC

## 2020-09-03 MED ORDER — RYBELSUS 7 MG PO TABS: 1.0000 | ORAL_TABLET | Freq: Every day | ORAL | 3 refills | Status: DC

## 2020-09-03 NOTE — Progress Notes (Signed)
Pt was notified of results. Pt has been out of medication. I advised her to restart medication again. Pt is scheduled for September for CPE

## 2020-09-06 ENCOUNTER — Encounter: Payer: Self-pay | Admitting: Internal Medicine

## 2020-09-15 ENCOUNTER — Other Ambulatory Visit: Payer: Self-pay | Admitting: Medical

## 2020-09-18 ENCOUNTER — Ambulatory Visit: Payer: 59 | Admitting: Medical

## 2020-11-04 ENCOUNTER — Ambulatory Visit: Payer: 59 | Admitting: Medical

## 2020-11-09 ENCOUNTER — Other Ambulatory Visit: Payer: Self-pay | Admitting: Medical

## 2020-11-12 ENCOUNTER — Telehealth: Payer: Self-pay | Admitting: Medical

## 2020-11-12 NOTE — Telephone Encounter (Signed)
Pt called requesting refill on potassium chloride   [facility]

## 2020-11-13 MED ORDER — POTASSIUM CHLORIDE ER 10 MEQ PO TBCR
10.0000 meq | EXTENDED_RELEASE_TABLET | Freq: Every day | ORAL | 0 refills | Status: DC
Start: 2020-11-13 — End: 2020-12-27

## 2020-11-13 NOTE — Telephone Encounter (Signed)
refilled 

## 2020-11-21 ENCOUNTER — Other Ambulatory Visit: Payer: Self-pay | Admitting: Medical

## 2020-12-26 ENCOUNTER — Ambulatory Visit (INDEPENDENT_AMBULATORY_CARE_PROVIDER_SITE_OTHER): Payer: Medicare Other | Admitting: Medical

## 2020-12-26 ENCOUNTER — Other Ambulatory Visit: Payer: Self-pay

## 2020-12-26 VITALS — BP 130/80 | HR 57 | Temp 98.5°F | Ht 65.0 in | Wt 238.4 lb

## 2020-12-26 DIAGNOSIS — J3489 Other specified disorders of nose and nasal sinuses: Secondary | ICD-10-CM | POA: Insufficient documentation

## 2020-12-26 DIAGNOSIS — Z7185 Encounter for immunization safety counseling: Secondary | ICD-10-CM | POA: Diagnosis not present

## 2020-12-26 DIAGNOSIS — I350 Nonrheumatic aortic (valve) stenosis: Secondary | ICD-10-CM

## 2020-12-26 DIAGNOSIS — E1169 Type 2 diabetes mellitus with other specified complication: Secondary | ICD-10-CM

## 2020-12-26 DIAGNOSIS — J301 Allergic rhinitis due to pollen: Secondary | ICD-10-CM | POA: Diagnosis not present

## 2020-12-26 DIAGNOSIS — I447 Left bundle-branch block, unspecified: Secondary | ICD-10-CM

## 2020-12-26 DIAGNOSIS — B070 Plantar wart: Secondary | ICD-10-CM | POA: Insufficient documentation

## 2020-12-26 DIAGNOSIS — Z0000 Encounter for general adult medical examination without abnormal findings: Secondary | ICD-10-CM

## 2020-12-26 DIAGNOSIS — E118 Type 2 diabetes mellitus with unspecified complications: Secondary | ICD-10-CM

## 2020-12-26 DIAGNOSIS — Z1211 Encounter for screening for malignant neoplasm of colon: Secondary | ICD-10-CM

## 2020-12-26 DIAGNOSIS — Z1231 Encounter for screening mammogram for malignant neoplasm of breast: Secondary | ICD-10-CM | POA: Diagnosis not present

## 2020-12-26 DIAGNOSIS — I10 Essential (primary) hypertension: Secondary | ICD-10-CM

## 2020-12-26 DIAGNOSIS — I8393 Asymptomatic varicose veins of bilateral lower extremities: Secondary | ICD-10-CM | POA: Diagnosis not present

## 2020-12-26 DIAGNOSIS — K635 Polyp of colon: Secondary | ICD-10-CM

## 2020-12-26 DIAGNOSIS — Z780 Asymptomatic menopausal state: Secondary | ICD-10-CM

## 2020-12-26 DIAGNOSIS — Z7189 Other specified counseling: Secondary | ICD-10-CM

## 2020-12-26 DIAGNOSIS — R011 Cardiac murmur, unspecified: Secondary | ICD-10-CM

## 2020-12-26 DIAGNOSIS — R059 Cough, unspecified: Secondary | ICD-10-CM

## 2020-12-26 DIAGNOSIS — Z23 Encounter for immunization: Secondary | ICD-10-CM

## 2020-12-26 DIAGNOSIS — I872 Venous insufficiency (chronic) (peripheral): Secondary | ICD-10-CM

## 2020-12-26 DIAGNOSIS — R4189 Other symptoms and signs involving cognitive functions and awareness: Secondary | ICD-10-CM

## 2020-12-26 DIAGNOSIS — E039 Hypothyroidism, unspecified: Secondary | ICD-10-CM

## 2020-12-26 DIAGNOSIS — E785 Hyperlipidemia, unspecified: Secondary | ICD-10-CM

## 2020-12-26 DIAGNOSIS — E2839 Other primary ovarian failure: Secondary | ICD-10-CM

## 2020-12-26 DIAGNOSIS — Z6839 Body mass index (BMI) 39.0-39.9, adult: Secondary | ICD-10-CM

## 2020-12-26 DIAGNOSIS — Z972 Presence of dental prosthetic device (complete) (partial): Secondary | ICD-10-CM

## 2020-12-26 MED ORDER — ATORVASTATIN CALCIUM 20 MG PO TABS: 20.0000 mg | ORAL_TABLET | Freq: Every day | ORAL | 3 refills | Status: DC

## 2020-12-26 MED ORDER — AMOXICILLIN 875 MG PO TABS: 875.0000 mg | ORAL_TABLET | Freq: Two times a day (BID) | ORAL | 0 refills | Status: AC

## 2020-12-26 NOTE — Progress Notes (Addendum)
Subjective:    Kathryn [name] is a 70 y.o. female who presents for Preventative Services visit and chronic medical problems/med check visit.    Chief Complaint  Patient presents with   fasting cpe    Fasting cpe, wants antibiotic for a cold that's been going on for a week. Symptoms- allergies, cough,runny nose, sneezing,. Would like flu shot    Primary Care Provider Tysinger, Kermit Balo, PA-C here for primary care  Current Health Care Team: Dentist, Dr. Not sure the name Eye doctor, don't see one Dr. Charna Elizabeth, GI Dr. Bryan Lemma, cardiology Dr. Consuela Mimes, vascular Dr. Sallye Lat, ophthalomology   Medical Services you may have received from other than [facility] providers in the past year (date may be approximate) Eye doctor, cataracts  Exercise Current exercise habits:  gym and some exercising    Nutrition/Diet Current diet: in general, a "healthy" diet    Depression Screen Depression screen [facility] 2/9 12/26/2020  Decreased Interest 0  Down, Depressed, Hopeless 0  PHQ - 2 Score 0    Activities of Daily Living Screen/Functional Status Survey Is the patient deaf or have difficulty hearing?: No Does the patient have difficulty seeing, even when wearing glasses/contacts?: No Does the patient have difficulty concentrating, remembering, or making decisions?: No Does the patient have difficulty walking or climbing stairs?: No Does the patient have difficulty dressing or bathing?: No Does the patient have difficulty doing errands alone such as visiting a doctor's office or shopping?: No  Can patient draw a clock face showing 3:15 o'clock, yes  Fall Risk Screen Fall Risk  12/26/2020 09/02/2020 11/02/2019 05/28/2017  Falls in the past year? 0 0 0 No  Number falls in past yr: 0 0 - -  Injury with Fall? 0 0 - -  Risk for fall due to : No Fall Risks No Fall Risks - -  Follow up Falls evaluation completed Falls evaluation completed - -    Gait Assessment: Normal gait  observed yes  Advanced directives Does patient have a Health Care Power of Attorney? Yes Does patient have a Living Will? No    Past Medical History:  Diagnosis Date   Anemia    Aortic stenosis 2010   Echo 04/2008:  EF 60%, mean AV gradient 7 mmHg.-Remains mild AS by echo in May 2018.  There appears to be a subvalvular component with hyperdynamic LV function.   Diabetes mellitus    Diverticula of colon    per colonoscopy6/20/11   Edema of lower extremity    wears ted hose   H/O echocardiogram 2014   EF 65%, slight gradient of aortic valve, no major findings otherwise   Hx of cardiovascular stress test    Adenosine Myoview 04/2008:  Normal.   Hyperlipidemia    Hypertension    Hypothyroidism    LBBB (left bundle branch block)    Obesity    Thyromegaly 04/23/08   enlarged heterogeneous thyroid gland on ultrasound   Tubulovillous adenoma    Wears dentures    upper   Wears glasses     Past Surgical History:  Procedure Laterality Date   CARPAL TUNNEL RELEASE     bilat   COLECTOMY  08/13/09   laparoscopic converted to open transverse colectomy w/ side to side anastomosis   COLONOSCOPY  09/01/10   Dr. Charna Elizabeth; polyp, tubular adenoma; repeat 3 years   LASIK     TRANSTHORACIC ECHOCARDIOGRAM  11/2012; 06/2016   a. EF~65%. Mild AS. Mean gradient 13  mmHg. Peak 20 mmHg;; b. Normal LV size and function.  EF 60-65%.  Possible mild LVOT gradient.   VEIN SURGERY  2010   right leg    Social History   Socioeconomic History   Marital status: Divorced    Spouse name: Not on file   Number of children: Not on file   Years of education: Not on file   Highest education level: Not on file  Occupational History   Not on file  Tobacco Use   Smoking status: Never   Smokeless tobacco: Never  Substance and Sexual Activity   Alcohol use: No   Drug use: No   Sexual activity: Not on file    Comment: married, 5 children, works at [employer], exrecise with walking  Other Topics Concern    Not on file  Social History Narrative   Single,  retired, exercises with walking, lives alone.  Daughter checks on her regularly.    10/2019   Social Determinants of Health   Financial Resource Strain: Not on file  Food Insecurity: Not on file  Transportation Needs: Not on file  Physical Activity: Not on file  Stress: Not on file  Social Connections: Not on file  Intimate Partner Violence: Not on file    Family History  Problem Relation Age of Onset   Cancer Father        stomach cancer   Diabetes Sister    CAD Sister        s/p PCI   CAD Mother        s/p PCI   CAD Brother        s/p PCI     Current Outpatient Medications:    amoxicillin (AMOXIL) 875 MG tablet, Take 1 tablet (875 mg total) by mouth 2 (two) times daily for 10 days., Disp: 20 tablet, Rfl: 0   cholecalciferol (VITAMIN D3) 25 MCG (1000 UNIT) tablet, Take 1 tablet (1,000 Units total) by mouth daily., Disp: 90 tablet, Rfl: 1   fluticasone (FLONASE) 50 MCG/ACT nasal spray, Place 2 sprays into both nostrils daily., Disp: 16 g, Rfl: 11   furosemide (LASIX) 20 MG tablet, TAKE 1 TABLET BY MOUTH EVERY DAY, Disp: 90 tablet, Rfl: 0   levothyroxine (SYNTHROID) 75 MCG tablet, TAKE 1 TABLET BY MOUTH EVERY DAY, Disp: 90 tablet, Rfl: 0   lisinopril-hydrochlorothiazide (ZESTORETIC) 20-12.5 MG tablet, TAKE 1 TABLET BY MOUTH EVERY DAY, Disp: 90 tablet, Rfl: 0   potassium chloride (KLOR-CON) 10 MEQ tablet, Take 1 tablet (10 mEq total) by mouth daily., Disp: 90 tablet, Rfl: 0   Semaglutide (RYBELSUS) 7 MG TABS, Take 1 tablet by mouth daily., Disp: 90 tablet, Rfl: 3   atorvastatin (LIPITOR) 20 MG tablet, Take 1 tablet (20 mg total) by mouth daily., Disp: 90 tablet, Rfl: 3   Insulin Pen Needle (B-D UF III MINI PEN NEEDLES) 31G X 5 MM MISC, 1 EACH BY DOES NOT APPLY ROUTE AS DIRECTED., Disp: 100 each, Rfl: 11  Allergies  Allergen Reactions   Codeine     History reviewed: allergies, current medications, past family history, past  medical history, past social history, past surgical history and problem list  Chronic issues discussed: High blood pressure-compliant with medication.  Does not check blood pressures at home  Diabetes-compliant with Rybelsus.  She does not check blood sugars.  She denies any polyuria or polydipsia.  She does have some foot concerns about plantar warts  High cholesterol-compliant with medication without complaint  She is compliant with  vitamin D supplement  Compliant with thyroid medication  She lives alone.  Her daughter [place] Parker who lives in [place] checks on her periodically throughout the week  She notes some exercise  Acute issues discussed: She has about a week history of sinus pressure, drainage, mucus thick, some cough.  Body aches chills or fever.  She requests an antibiotic for this.  Objective:     BP 130/80   Pulse (!) 57   Temp 98.5 F (36.9 C)   Ht 5\' 5"  (1.651 m)   Wt 238 lb 6.4 oz (108.1 kg)   LMP  (LMP Unknown)   BMI 39.67 kg/m   BP Readings from Last 3 Encounters:  12/26/20 130/80  09/02/20 120/72  07/24/20 120/70   Wt Readings from Last 3 Encounters:  12/26/20 238 lb 6.4 oz (108.1 kg)  09/02/20 240 lb 12.8 oz (109.2 kg)  07/24/20 240 lb (108.9 kg)    Biometrics BP 130/80   Pulse (!) 57   Temp 98.5 F (36.9 C)   Ht 5\' 5"  (1.651 m)   Wt 238 lb 6.4 oz (108.1 kg)   LMP  (LMP Unknown)   BMI 39.67 kg/m   Gen: wd, wn, nad, 09/23/20 American female Skin: no acute worrisome changes, skin tags of right temple unchanged HEENT: normocephalic, sclerae anicteric, +sinus pressure,TMs pearly, nares patent, no discharge or erythema, pharynx normal Oral cavity: MMM, no lesions Neck: supple, no lymphadenopathy, no thyromegaly, no masses, no bruits Heart: RRR, normal S1, S2, 2/6 brief upper sternal border murmur Lungs: CTA bilaterally, no wheezes, rhonchi, or rales Abdomen: +bs, soft, non tender, non distended, no masses, no hepatomegaly, no  splenomegaly Musculoskeletal: nontender, no swelling, no obvious deformity Extremities: no edema, no cyanosis, no clubbing Pulses: 2+ symmetric, upper and lower extremities, normal cap refill Neurological: alert, oriented x 3, CN2-12 intact, strength normal upper extremities and lower extremities, sensation normal throughout, DTRs 2+ throughout, no cerebellar signs, gait normal Psychiatric: normal affect, behavior normal, pleasant  Breast/gyn - deferred to later date  Diabetic Foot Exam - Simple   Simple Foot Form Diabetic Foot exam was performed with the following findings: Yes 12/26/2020  5:04 PM  Visual Inspection See comments: Yes Sensation Testing Intact to touch and monofilament testing bilaterally: Yes Pulse Check Posterior Tibialis and Dorsalis pulse intact bilaterally: Yes Comments Right volar foot with 4 separate plantar wart pads with treatment, otherwise foot unremarkable, similar medicated pads on her left forefoot for plantar warts      Assessment:   Encounter Diagnoses  Name Primary?   Encounter for health maintenance examination in adult Yes   Medicare annual wellness visit, subsequent    Needs flu shot    Encounter for screening mammogram for malignant neoplasm of breast    Screen for colon cancer    Varicose veins of both lower extremities, unspecified whether complicated    Vaccine counseling    Type 2 diabetes mellitus with other specified complication, unspecified whether long term insulin use (HCC)    Allergic rhinitis due to pollen, unspecified seasonality    Post-menopausal    Need for influenza vaccination    Advanced directives, counseling/discussion    Chronic venous insufficiency    Polyp of colon, unspecified part of colon, unspecified type    Diabetes mellitus type 2 with complications (HCC)    Essential hypertension    Estrogen deficiency    Hyperlipidemia associated with type 2 diabetes mellitus (HCC)    Hypothyroidism, unspecified type  Impaired cognitive perception pattern    LBBB (left bundle branch block)    Mild aortic stenosis by prior echocardiogram    Morbid obesity (HCC)    BMI 39.0-39.9,adult    Plantar wart of both feet    Wears dentures    Sinus pressure    Cough, unspecified type      Plan:   This visit was a preventative care visit, also known as wellness visit or routine physical.   Topics typically include healthy lifestyle, diet, exercise, preventative care, vaccinations, sick and well care, proper use of emergency dept and after hours care, as well as other concerns.     Recommendations: Continue to return yearly for your annual wellness and preventative care visits.  This gives [place] a chance to discuss healthy lifestyle, exercise, vaccinations, review your chart record, and perform screenings where appropriate.  I recommend you see your eye doctor yearly for routine vision care.  I recommend you see your dentist yearly for routine dental care including hygiene visits twice yearly.   Vaccination recommendations were reviewed Immunization History  Administered Date(s) Administered   Fluad Quad(high Dose 65+) 11/01/2018, 11/02/2019, 03/27/2020, 12/26/2020   Hepatitis B, adult 11/28/2012, 12/29/2012, 03/15/2014   Influenza, High Dose Seasonal PF 04/28/2017, 10/24/2017, 01/01/2018, 10/19/2018   Influenza,inj,Quad PF,6+ Mos 11/28/2012, 03/15/2014, 03/16/2016, 10/16/2016, 02/03/2017   PFIZER Comirnaty(Gray Top)Covid-19 Tri-Sucrose Vaccine 09/02/2020   PFIZER(Purple Top)SARS-COV-2 Vaccination 04/23/2019, 05/23/2019, 12/21/2019   PNEUMOCOCCAL CONJUGATE-20 04/02/2020   Pneumococcal Conjugate-13 10/24/2017, 06/08/2018, 10/20/2018   Pneumococcal Polysaccharide-23 11/28/2012   Tdap 11/27/2010   Zoster Recombinat (Shingrix) 06/08/2018, 10/20/2018, 11/01/2018   Zoster, Live 11/27/2010    Counseled on the influenza virus vaccine.  Vaccine information sheet given.   High dose Influenza vaccine given after  consent obtained.  You are up to date otherwise   Screening for cancer: Colon cancer screening: We will refer you for back to Dr. Loreta Ave for screening colonoscopy  Return for breast and pelvic exam at your convenience  Breast cancer screening: You should perform a self breast exam monthly.   We reviewed recommendations for regular mammograms and breast cancer screening.   Please call to schedule your mammogram and bone density test  [facility]  [phone] [address], [address] [place], [place] [address]    Cervical cancer screening: We reviewed recommendations for pap smear screening.  You are due for pap with HPV   Skin cancer screening: Check your skin regularly for new changes, growing lesions, or other lesions of concern Come in for evaluation if you have skin lesions of concern.  Lung cancer screening: If you have a greater than 20 pack year history of tobacco use, then you may qualify for lung cancer screening with a chest CT scan.   Please call your insurance company to inquire about coverage for this test.  We currently don't have screenings for other cancers besides breast, cervical, colon, and lung cancers.  If you have a strong family history of cancer or have other cancer screening concerns, please let me know.    Bone health: Get at least 150 minutes of aerobic exercise weekly Get weight bearing exercise at least once weekly Bone density test:  A bone density test is an imaging test that uses a type of X-ray to measure the amount of calcium and other minerals in your bones. The test may be used to diagnose or screen you for a condition that causes weak or thin bones (osteoporosis), predict your risk  for a broken bone (fracture), or determine how well your osteoporosis treatment is working. The bone density test is recommended for females 65 and older, or females or males <65 if certain risk factors such as thyroid disease, long  term use of steroids such as for asthma or rheumatological issues, vitamin D deficiency, estrogen deficiency, family history of osteoporosis, self or family history of fragility fracture in first degree relative.   Call and schedule bone density test    Heart health: Get at least 150 minutes of aerobic exercise weekly Limit alcohol It is important to maintain a healthy blood pressure and healthy cholesterol numbers  Heart disease screening: Screening for heart disease includes screening for blood pressure, fasting lipids, glucose/diabetes screening, BMI height to weight ratio, reviewed of smoking status, physical activity, and diet.    Goals include blood pressure 120/80 or less, maintaining a healthy lipid/cholesterol profile, preventing diabetes or keeping diabetes numbers under good control, not smoking or using tobacco products, exercising most days per week or at least 150 minutes per week of exercise, and eating healthy variety of fruits and vegetables, healthy oils, and avoiding unhealthy food choices like fried food, fast food, high sugar and high cholesterol foods.       Medical care options: I recommend you continue to seek care here first for routine care.  We try really hard to have available appointments Monday through Friday daytime hours for sick visits, acute visits, and physicals.  Urgent care should be used for after hours and weekends for significant issues that cannot wait till the next day.  The emergency department should be used for significant potentially life-threatening emergencies.  The emergency department is expensive, can often have long wait times for less significant concerns, so try to utilize primary care, urgent care, or telemedicine when possible to avoid unnecessary trips to the emergency department.  Virtual visits and telemedicine have been introduced since the pandemic started in 2020, and can be convenient ways to receive medical care.  We offer virtual  appointments as well to assist you in a variety of options to seek medical care.   Advanced Directives: I recommend you consider completing a Health Care Power of Attorney and Living Will.   These documents respect your wishes and help alleviate burdens on your loved ones if you were to become terminally ill or be in a position to need those documents enforced.    You can complete Advanced Directives yourself, have them notarized, then have copies made for our office, for you and for anybody you feel should have them in safe keeping.  Or, you can have an attorney prepare these documents.   If you haven't updated your Last Will and Testament in a while, it may be worthwhile having an attorney prepare these documents together and save on some costs.        Specific Concerns: Diabetes-continue Rybelsus tablet.  If your blood sugars not at goal we will increase the dose of this.  I do recommend you check your blood sugars.  The goal is to check morning fasting.  We want numbers to be less than 130 fasting.  We want to avoid any low readings under 70.  Check your feet daily for sores or wounds.  See your eye doctor yearly  Cataracts-we called her eye doctor and she simply needs to call them to get on the schedule for procedure.  We gave her this information.    Hyperlipidemia-continue statin Atorvastatin Lipitor 20 mg daily  Hypertension-continue current medications, Lisinopril HCT 20/12.5 mg daily and Lasix 20 mg daily, along with potassium 10 mEq daily  Hypothyroidism-discussed proper use of medication, continue current medication Levothyroxine 75 mcg daily, labs today  Varicose veins-follow-up with vascular specialist if pain or concerns  Vitamin D deficiency-continue supplement vitamin D  Plantar wart-she declines referral to podiatry.  Advise over-the-counter wart remover if not improving over the next 2 to 3 weeks then recheck  Sinusitis-begin amoxicillin twice daily for 10 days.   Consider Mucinex and nasal saline flush during this time.  Allergic rhinitis/seasonal allergies-use allergy pill over-the-counter as needed seasonally  Wears dentures  Heart murmur - reviewed 2021 echocardiogram  Meztli was seen today for fasting cpe.  Diagnoses and all orders for this visit:  Encounter for health maintenance examination in adult -     Hemoglobin A1c -     TSH + free T4 -     Lipid panel -     CBC -     Comprehensive metabolic panel -     Microalbumin/Creatinine Ratio, Urine -     MM DIGITAL SCREENING BILATERAL; Future -     DG Bone Density; Future  Medicare annual wellness visit, subsequent  Needs flu shot -     Flu Vaccine QUAD High Dose(Fluad)  Encounter for screening mammogram for malignant neoplasm of breast -     MM DIGITAL SCREENING BILATERAL; Future  Screen for colon cancer -     Ambulatory referral to Gastroenterology  Varicose veins of both lower extremities, unspecified whether complicated  Vaccine counseling  Type 2 diabetes mellitus with other specified complication, unspecified whether long term insulin use (HCC) -     Hemoglobin A1c -     CBC -     Comprehensive metabolic panel -     Microalbumin/Creatinine Ratio, Urine  Allergic rhinitis due to pollen, unspecified seasonality  Post-menopausal -     DG Bone Density; Future  Need for influenza vaccination  Advanced directives, counseling/discussion  Chronic venous insufficiency  Polyp of colon, unspecified part of colon, unspecified type  Diabetes mellitus type 2 with complications (HCC)  Essential hypertension  Estrogen deficiency -     DG Bone Density; Future  Hyperlipidemia associated with type 2 diabetes mellitus (HCC) -     Lipid panel  Hypothyroidism, unspecified type -     TSH + free T4  Impaired cognitive perception pattern  LBBB (left bundle branch block)  Mild aortic stenosis by prior echocardiogram  Morbid obesity (HCC)  BMI  39.0-39.9,adult  Plantar wart of both feet  Wears dentures  Sinus pressure  Cough, unspecified type  Other orders -     amoxicillin (AMOXIL) 875 MG tablet; Take 1 tablet (875 mg total) by mouth 2 (two) times daily for 10 days. -     atorvastatin (LIPITOR) 20 MG tablet; Take 1 tablet (20 mg total) by mouth daily.        Medicare Attestation A preventative services visit was completed today.  During the course of the visit the patient was educated and counseled about appropriate screening and preventive services.  A health risk assessment was established with the patient that included a review of current medications, allergies, social history, family history, medical and preventative health history, biometrics, and preventative screenings to identify potential safety concerns or impairments.   Kristian Covey, PA-C   12/26/2020

## 2020-12-26 NOTE — Patient Instructions (Signed)
This visit was a preventative care visit, also known as wellness visit or routine physical.   Topics typically include healthy lifestyle, diet, exercise, preventative care, vaccinations, sick and well care, proper use of emergency dept and after hours care, as well as other concerns.     Recommendations: Continue to return yearly for your annual wellness and preventative care visits.  This gives [place] a chance to discuss healthy lifestyle, exercise, vaccinations, review your chart record, and perform screenings where appropriate.  I recommend you see your eye doctor yearly for routine vision care.  I recommend you see your dentist yearly for routine dental care including hygiene visits twice yearly.   Vaccination recommendations were reviewed Immunization History  Administered Date(s) Administered   Fluad Quad(high Dose 65+) 11/01/2018, 11/02/2019, 03/27/2020   Hepatitis B, adult 11/28/2012, 12/29/2012, 03/15/2014   Influenza, High Dose Seasonal PF 04/28/2017, 10/24/2017, 01/01/2018, 10/19/2018   Influenza,inj,Quad PF,6+ Mos 11/28/2012, 03/15/2014, 03/16/2016, 10/16/2016, 02/03/2017   PFIZER Comirnaty(Gray Top)Covid-19 Tri-Sucrose Vaccine 09/02/2020   PFIZER(Purple Top)SARS-COV-2 Vaccination 04/23/2019, 05/23/2019, 12/21/2019   PNEUMOCOCCAL CONJUGATE-20 04/02/2020   Pneumococcal Conjugate-13 10/24/2017, 06/08/2018, 10/20/2018   Pneumococcal Polysaccharide-23 11/28/2012   Tdap 11/27/2010   Zoster Recombinat (Shingrix) 06/08/2018, 10/20/2018, 11/01/2018   Zoster, Live 11/27/2010    Counseled on the influenza virus vaccine.  Vaccine information sheet given.   High dose Influenza vaccine given after consent obtained.  You are up to date otherwise   Screening for cancer: Colon cancer screening: We will refer you for back to Dr. Loreta Ave for screening colonoscopy  Return for breast and pelvic exam at your convenience  Breast cancer screening: You should perform a self breast exam monthly.    We reviewed recommendations for regular mammograms and breast cancer screening.   Please call to schedule your mammogram and bone density test  [facility]  [phone] [address] [place], [place] [address]    Cervical cancer screening: We reviewed recommendations for pap smear screening.  You are due for pap with HPV   Skin cancer screening: Check your skin regularly for new changes, growing lesions, or other lesions of concern Come in for evaluation if you have skin lesions of concern.  Lung cancer screening: If you have a greater than 20 pack year history of tobacco use, then you may qualify for lung cancer screening with a chest CT scan.   Please call your insurance company to inquire about coverage for this test.  We currently don't have screenings for other cancers besides breast, cervical, colon, and lung cancers.  If you have a strong family history of cancer or have other cancer screening concerns, please let me know.    Bone health: Get at least 150 minutes of aerobic exercise weekly Get weight bearing exercise at least once weekly Bone density test:  A bone density test is an imaging test that uses a type of X-ray to measure the amount of calcium and other minerals in your bones. The test may be used to diagnose or screen you for a condition that causes weak or thin bones (osteoporosis), predict your risk for a broken bone (fracture), or determine how well your osteoporosis treatment is working. The bone density test is recommended for females 65 and older, or females or males <65 if certain risk factors such as thyroid disease, long term use of steroids such as for asthma or rheumatological issues, vitamin D deficiency, estrogen deficiency, family history of osteoporosis, self or family history of fragility  fracture in first degree relative.   Call and schedule bone density test    Heart health: Get at least 150 minutes  of aerobic exercise weekly Limit alcohol It is important to maintain a healthy blood pressure and healthy cholesterol numbers  Heart disease screening: Screening for heart disease includes screening for blood pressure, fasting lipids, glucose/diabetes screening, BMI height to weight ratio, reviewed of smoking status, physical activity, and diet.    Goals include blood pressure 120/80 or less, maintaining a healthy lipid/cholesterol profile, preventing diabetes or keeping diabetes numbers under good control, not smoking or using tobacco products, exercising most days per week or at least 150 minutes per week of exercise, and eating healthy variety of fruits and vegetables, healthy oils, and avoiding unhealthy food choices like fried food, fast food, high sugar and high cholesterol foods.       Medical care options: I recommend you continue to seek care here first for routine care.  We try really hard to have available appointments Monday through Friday daytime hours for sick visits, acute visits, and physicals.  Urgent care should be used for after hours and weekends for significant issues that cannot wait till the next day.  The emergency department should be used for significant potentially life-threatening emergencies.  The emergency department is expensive, can often have long wait times for less significant concerns, so try to utilize primary care, urgent care, or telemedicine when possible to avoid unnecessary trips to the emergency department.  Virtual visits and telemedicine have been introduced since the pandemic started in 2020, and can be convenient ways to receive medical care.  We offer virtual appointments as well to assist you in a variety of options to seek medical care.   Advanced Directives: I recommend you consider completing a Health Care Power of Attorney and Living Will.   These documents respect your wishes and help alleviate burdens on your loved ones if you were to become  terminally ill or be in a position to need those documents enforced.    You can complete Advanced Directives yourself, have them notarized, then have copies made for our office, for you and for anybody you feel should have them in safe keeping.  Or, you can have an attorney prepare these documents.   If you haven't updated your Last Will and Testament in a while, it may be worthwhile having an attorney prepare these documents together and save on some costs.

## 2020-12-27 ENCOUNTER — Other Ambulatory Visit: Payer: Self-pay | Admitting: Medical

## 2020-12-27 LAB — HEMOGLOBIN A1C
Est. average glucose Bld gHb Est-mCnc: 131 mg/dL
Hgb A1c MFr Bld: 6.2 % — ABNORMAL HIGH (ref 4.8–5.6)

## 2020-12-27 LAB — COMPREHENSIVE METABOLIC PANEL
ALT: 16 IU/L (ref 0–32)
AST: 18 IU/L (ref 0–40)
Albumin/Globulin Ratio: 1.5 (ref 1.2–2.2)
Albumin: 4.4 g/dL (ref 3.8–4.8)
Alkaline Phosphatase: 93 IU/L (ref 44–121)
BUN/Creatinine Ratio: 12 (ref 12–28)
BUN: 13 mg/dL (ref 8–27)
Bilirubin Total: 0.5 mg/dL (ref 0.0–1.2)
CO2: 25 mmol/L (ref 20–29)
Calcium: 9.7 mg/dL (ref 8.7–10.3)
Chloride: 104 mmol/L (ref 96–106)
Creatinine, Ser: 1.05 mg/dL — ABNORMAL HIGH (ref 0.57–1.00)
Globulin, Total: 3 g/dL (ref 1.5–4.5)
Glucose: 92 mg/dL (ref 70–99)
Potassium: 4.3 mmol/L (ref 3.5–5.2)
Sodium: 141 mmol/L (ref 134–144)
Total Protein: 7.4 g/dL (ref 6.0–8.5)
eGFR: 58 mL/min/{1.73_m2} — ABNORMAL LOW (ref >59)

## 2020-12-27 LAB — TSH+FREE T4
Free T4: 1.24 ng/dL (ref 0.82–1.77)
TSH: 1.49 u[IU]/mL (ref 0.450–4.500)

## 2020-12-27 LAB — LIPID PANEL
Chol/HDL Ratio: 4.2 ratio (ref 0.0–4.4)
Cholesterol, Total: 192 mg/dL (ref 100–199)
HDL: 46 mg/dL (ref >39)
LDL Chol Calc (NIH): 133 mg/dL — ABNORMAL HIGH (ref 0–99)
Triglycerides: 69 mg/dL (ref 0–149)
VLDL Cholesterol Cal: 13 mg/dL (ref 5–40)

## 2020-12-27 LAB — CBC
Hematocrit: 35.6 % (ref 34.0–46.6)
Hemoglobin: 11.1 g/dL (ref 11.1–15.9)
MCH: 25.5 pg — ABNORMAL LOW (ref 26.6–33.0)
MCHC: 31.2 g/dL — ABNORMAL LOW (ref 31.5–35.7)
MCV: 82 fL (ref 79–97)
Platelets: 253 10*3/uL (ref 150–450)
RBC: 4.36 x10E6/uL (ref 3.77–5.28)
RDW: 14.5 % (ref 11.7–15.4)
WBC: 5 10*3/uL (ref 3.4–10.8)

## 2020-12-27 LAB — MICROALBUMIN / CREATININE URINE RATIO
Creatinine, Urine: 21.7 mg/dL
Microalb/Creat Ratio: <14 mg/g creat (ref 0–29)
Microalbumin, Urine: <3 ug/mL

## 2020-12-27 MED ORDER — ATORVASTATIN CALCIUM 40 MG PO TABS: 40.0000 mg | ORAL_TABLET | Freq: Every day | ORAL | 0 refills | Status: DC

## 2021-02-04 ENCOUNTER — Other Ambulatory Visit: Payer: Self-pay | Admitting: Medical

## 2021-02-07 ENCOUNTER — Other Ambulatory Visit: Payer: Self-pay | Admitting: Medical

## 2021-02-18 ENCOUNTER — Other Ambulatory Visit: Payer: Self-pay | Admitting: Medical

## 2021-03-26 ENCOUNTER — Telehealth: Payer: Self-pay | Admitting: Medical

## 2021-03-26 NOTE — Telephone Encounter (Signed)
Pt called and wants to know if you want her to conitune taking the potassium and the lasix please send to the [id number] - [place], [place] [address]

## 2021-03-27 ENCOUNTER — Other Ambulatory Visit: Payer: Self-pay | Admitting: Medical

## 2021-03-27 MED ORDER — POTASSIUM CHLORIDE ER 10 MEQ PO TBCR: 10.0000 meq | EXTENDED_RELEASE_TABLET | Freq: Every day | ORAL | 2 refills | Status: DC

## 2021-03-27 MED ORDER — FUROSEMIDE 20 MG PO TABS: 20.0000 mg | ORAL_TABLET | Freq: Every day | ORAL | 2 refills | Status: DC

## 2021-03-27 NOTE — Telephone Encounter (Signed)
Pt states she only uses this as needed about once a week or just when swelling occurs. She is out of medications and need this

## 2021-03-31 ENCOUNTER — Other Ambulatory Visit: Payer: Self-pay

## 2021-03-31 ENCOUNTER — Ambulatory Visit (INDEPENDENT_AMBULATORY_CARE_PROVIDER_SITE_OTHER): Payer: Medicare Other | Admitting: Medical

## 2021-03-31 VITALS — BP 110/70 | HR 68 | Wt 239.2 lb

## 2021-03-31 DIAGNOSIS — Z1231 Encounter for screening mammogram for malignant neoplasm of breast: Secondary | ICD-10-CM

## 2021-03-31 DIAGNOSIS — E039 Hypothyroidism, unspecified: Secondary | ICD-10-CM

## 2021-03-31 DIAGNOSIS — E118 Type 2 diabetes mellitus with unspecified complications: Secondary | ICD-10-CM

## 2021-03-31 DIAGNOSIS — I10 Essential (primary) hypertension: Secondary | ICD-10-CM

## 2021-03-31 DIAGNOSIS — J301 Allergic rhinitis due to pollen: Secondary | ICD-10-CM

## 2021-03-31 DIAGNOSIS — Z136 Encounter for screening for cardiovascular disorders: Secondary | ICD-10-CM

## 2021-03-31 DIAGNOSIS — I8393 Asymptomatic varicose veins of bilateral lower extremities: Secondary | ICD-10-CM

## 2021-03-31 DIAGNOSIS — E1169 Type 2 diabetes mellitus with other specified complication: Secondary | ICD-10-CM | POA: Diagnosis not present

## 2021-03-31 DIAGNOSIS — K635 Polyp of colon: Secondary | ICD-10-CM

## 2021-03-31 DIAGNOSIS — E785 Hyperlipidemia, unspecified: Secondary | ICD-10-CM

## 2021-03-31 DIAGNOSIS — I350 Nonrheumatic aortic (valve) stenosis: Secondary | ICD-10-CM

## 2021-03-31 DIAGNOSIS — R413 Other amnesia: Secondary | ICD-10-CM

## 2021-03-31 DIAGNOSIS — I872 Venous insufficiency (chronic) (peripheral): Secondary | ICD-10-CM

## 2021-03-31 MED ORDER — LEVOTHYROXINE SODIUM 75 MCG PO TABS: 75.0000 ug | ORAL_TABLET | Freq: Every day | ORAL | 3 refills | Status: DC

## 2021-03-31 MED ORDER — CETIRIZINE HCL 10 MG PO TABS: 10.0000 mg | ORAL_TABLET | Freq: Every day | ORAL | 11 refills | Status: DC

## 2021-03-31 MED ORDER — RYBELSUS 14 MG PO TABS: 14.0000 mg | ORAL_TABLET | Freq: Every day | ORAL | 3 refills | Status: DC

## 2021-03-31 MED ORDER — VITAMIN D 25 MCG (1000 UNIT) PO TABS: 1000.0000 [IU] | ORAL_TABLET | Freq: Every day | ORAL | 3 refills | Status: DC

## 2021-03-31 NOTE — Progress Notes (Signed)
Subjective:  Kathryn [name] is a 71 y.o. female who presents for Chief Complaint  Patient presents with   3 month follow-up    3 month follow-up. Wants a refill on her fluid pill     Here for 34-month med check  She is not real clear today whether she is taking blood pressure medication lisinopril HCT.  We called and verified at the pharmacy that she did pick up a refill in December and should be taking this.  We recently had questions about refilling Lasix and potassium since we got a refill request from pharmacy.  We discontinued this last visit but she still says she needs those medications.  She says she gets some swelling but we have not seen obvious swelling in recent visits.   Last visit we referred to gastroenterology back to Kathryn [name].  She notes that she has not heard back about this  Diabetes-compliant with Rybelsus without complaint  Hyperlipidemia-compliant with statin.  We increased the dose last visit.  Hypothyroidism-compliant with medication.  She has not similar visits asked for antibiotics again today.  When questioned about the reasoning she says she just needs some antibiotics because of colds and infections.  She has no current symptoms of concern other than some sneezing and itchy nose.  no other aggravating or relieving factors.    No other c/o.  Past Medical History:  Diagnosis Date   Anemia    Aortic stenosis 2010   Echo 04/2008:  EF 60%, mean AV gradient 7 mmHg.-Remains mild AS by echo in May 2018.  There appears to be a subvalvular component with hyperdynamic LV function.   Diabetes mellitus    Diverticula of colon    per colonoscopy6/20/11   Edema of lower extremity    wears ted hose   H/O echocardiogram 2014   EF 65%, slight gradient of aortic valve, no major findings otherwise   Hx of cardiovascular stress test    Adenosine Myoview 04/2008:  Normal.   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 90 tablet 0   fluticasone (FLONASE) 50 MCG/ACT nasal spray Place 2 sprays into both nostrils daily. 16 g 11   lisinopril-hydrochlorothiazide (ZESTORETIC) 20-12.5 MG tablet TAKE 1 TABLET BY MOUTH EVERY DAY 90 tablet 0   Insulin Pen Needle (B-D UF III MINI PEN NEEDLES) 31G X 5 MM MISC 1 EACH BY DOES NOT APPLY ROUTE AS DIRECTED. 100 each 11   No current facility-administered medications on file prior to visit.    The following portions of the patient's history were reviewed and updated as appropriate: allergies, current medications, past family history, past medical history, past social history, past surgical history and problem list.  ROS Otherwise as in subjective above     Objective: BP 110/70    Pulse 68    Wt 239 lb 3.2 oz (108.5 kg)    LMP  (LMP Unknown)    BMI 39.80 kg/m   General appearance: alert, no distress, well developed, well nourished neck: supple, no lymphadenopathy, no thyromegaly, no masses Heart:  RRR, normal S1, S2, no murmurs Lungs: CTA bilaterally, no wheezes, rhonchi, or rales Abdomen: +bs, soft, non tender, non distended, no masses, no hepatomegaly, no splenomegaly Pulses: 2+ radial pulses, 2+ pedal pulses, normal cap refill Ext: no edema Neuro: cn2-12 intact, nonfocal exam   Assessment: Encounter Diagnoses  Name Primary?   Diabetes mellitus type 2 with complications (HCC) Yes   Encounter for screening mammogram for malignant neoplasm of breast    Hyperlipidemia associated with type 2 diabetes mellitus (HCC)    Hypothyroidism, unspecified type    Essential hypertension    Varicose veins of both lower extremities, unspecified whether  complicated    Chronic venous insufficiency    Mild aortic stenosis by prior echocardiogram    Allergic rhinitis due to pollen, unspecified seasonality    Polyp of colon, unspecified part of colon, unspecified type    Memory change    Encounter for screening for vascular disease    Screening mammogram for breast cancer      Plan: Discussed her chronic issues, current concerns, and discussed recommendations as below.  Recommendations:  Diabetes Continue Rybelsus, however I increased the dose to 14 mg a day.  I think this can help with additional weight loss and blood sugar control Check your sugars fasting several days per week and goal is less than 130 fasting  High blood pressure We called and verify with your pharmacy that you did pick up the lisinopril HCT medication in December on December 27.  This medicine is for blood pressure and does have a fluid pill in it Thus, you do not need a separate fluid pill So there is no need for extra furosemide Lasix and potassium at this time  Thyroid disease Continue levothyroxine 75 mcg daily in the morning on an empty stomach about 45 minutes before breakfast  High cholesterol Last visit we increased your atorvastatin Lipitor from 20 mg up to 40 mg daily We are rechecking your cholesterol levels today to see if that dose is working  Vitamin D deficiency Continue vitamin D supplement 1000 units daily  Sinuses and allergies Begin cetirizine 10 mg daily at bedtime There is no indication that you need an antibiotic today however I do think you could benefit by doing cetirizine daily  Memory concerns I would like to refer you to neurology for evaluation   (There have been concerns now and at prior visits about memory.  Today she requests repeat flu vaccine although she just had one recently.  In the past there have been several times I have been concerned about her memory, ability to manage her medications, and overall cognition and  decision making)   Kathryn [name] was seen today for 3 month follow-up.  Diagnoses and all orders for this visit:  Diabetes mellitus type 2 with complications (HCC) -     Comprehensive metabolic panel -     VAS [place] ABI WITH/WO TBI; Future  Encounter for screening mammogram for malignant neoplasm of breast -     MM DIGITAL SCREENING BILATERAL; Future  Hyperlipidemia associated with type 2 diabetes mellitus (HCC) -     Comprehensive metabolic panel -     Lipid panel  Hypothyroidism, unspecified type  Essential hypertension  Varicose veins of both lower extremities, unspecified whether complicated  Chronic venous insufficiency  Mild aortic stenosis by prior echocardiogram  Allergic rhinitis due to pollen, unspecified seasonality  Polyp of colon, unspecified part of colon, unspecified type  Memory change -     Ambulatory referral  to Neurology  Encounter for screening for vascular disease -     VAS [place] ABI WITH/WO TBI; Future  Screening mammogram for breast cancer  Other orders -     Semaglutide (RYBELSUS) 14 MG TABS; Take 14 mg by mouth daily. -     levothyroxine (SYNTHROID) 75 MCG tablet; Take 1 tablet (75 mcg total) by mouth daily. -     cholecalciferol (VITAMIN D3) 25 MCG (1000 UNIT) tablet; Take 1 tablet (1,000 Units total) by mouth daily. -     cetirizine (ZYRTEC) 10 MG tablet; Take 1 tablet (10 mg total) by mouth at bedtime.   Follow up: pending labs

## 2021-03-31 NOTE — Patient Instructions (Signed)
Recommendations:  Diabetes Continue Rybelsus, however I increased the dose to 14 mg a day.  I think this can help with additional weight loss and blood sugar control Check your sugars fasting several days per week and goal is less than 130 fasting  High blood pressure We called and verify with your pharmacy that you did pick up the lisinopril HCT medication in December on December 27.  This medicine is for blood pressure and does have a fluid pill in it Thus, you do not need a separate fluid pill So there is no need for extra furosemide Lasix and potassium at this time  Thyroid disease Continue levothyroxine 75 mcg daily in the morning on an empty stomach about 45 minutes before breakfast  High cholesterol Last visit we increased your atorvastatin Lipitor from 20 mg up to 40 mg daily We are rechecking your cholesterol levels today to see if that dose is working  Vitamin D deficiency Continue vitamin D supplement 1000 units daily  Sinuses and allergies Begin cetirizine 10 mg daily at bedtime There is no indication that you need an antibiotic today however I do think you could benefit by doing cetirizine daily  Memory concerns I would like to refer you to neurology for evaluation to make sure things are okay with overall memory and cognition Sometimes I have been concerned about your memory in the past Today you asked to get another flu shot although you just had 1 within a few months ago There have been other times I have been concerned about your memory

## 2021-04-01 LAB — COMPREHENSIVE METABOLIC PANEL
ALT: 15 IU/L (ref 0–32)
AST: 16 IU/L (ref 0–40)
Albumin/Globulin Ratio: 1.2 (ref 1.2–2.2)
Albumin: 4 g/dL (ref 3.8–4.8)
Alkaline Phosphatase: 107 IU/L (ref 44–121)
BUN/Creatinine Ratio: 14 (ref 12–28)
BUN: 14 mg/dL (ref 8–27)
Bilirubin Total: 0.3 mg/dL (ref 0.0–1.2)
CO2: 24 mmol/L (ref 20–29)
Calcium: 10.2 mg/dL (ref 8.7–10.3)
Chloride: 107 mmol/L — ABNORMAL HIGH (ref 96–106)
Creatinine, Ser: 0.99 mg/dL (ref 0.57–1.00)
Globulin, Total: 3.3 g/dL (ref 1.5–4.5)
Glucose: 87 mg/dL (ref 70–99)
Potassium: 4.3 mmol/L (ref 3.5–5.2)
Sodium: 142 mmol/L (ref 134–144)
Total Protein: 7.3 g/dL (ref 6.0–8.5)
eGFR: 62 mL/min/{1.73_m2} (ref >59)

## 2021-04-01 LAB — LIPID PANEL
Chol/HDL Ratio: 4 ratio (ref 0.0–4.4)
Cholesterol, Total: 170 mg/dL (ref 100–199)
HDL: 43 mg/dL (ref >39)
LDL Chol Calc (NIH): 117 mg/dL — ABNORMAL HIGH (ref 0–99)
Triglycerides: 51 mg/dL (ref 0–149)
VLDL Cholesterol Cal: 10 mg/dL (ref 5–40)

## 2021-05-08 ENCOUNTER — Telehealth: Payer: Self-pay | Admitting: Internal Medicine

## 2021-05-08 NOTE — Telephone Encounter (Signed)
Left message for pt to call back. Trying to see if she ever scheduled with [facility] for colonoscopy. They tried to reach her  ?

## 2021-05-21 ENCOUNTER — Other Ambulatory Visit: Payer: Self-pay | Admitting: Medical

## 2021-06-25 ENCOUNTER — Other Ambulatory Visit: Payer: Self-pay

## 2021-06-25 ENCOUNTER — Encounter: Payer: Self-pay | Admitting: Medical

## 2021-06-25 ENCOUNTER — Ambulatory Visit (INDEPENDENT_AMBULATORY_CARE_PROVIDER_SITE_OTHER): Payer: Medicare Other | Admitting: Medical

## 2021-06-25 VITALS — BP 124/78 | HR 64 | Temp 98.3°F | Wt 246.4 lb

## 2021-06-25 DIAGNOSIS — I8393 Asymptomatic varicose veins of bilateral lower extremities: Secondary | ICD-10-CM

## 2021-06-25 DIAGNOSIS — I872 Venous insufficiency (chronic) (peripheral): Secondary | ICD-10-CM

## 2021-06-25 DIAGNOSIS — R609 Edema, unspecified: Secondary | ICD-10-CM | POA: Insufficient documentation

## 2021-06-25 DIAGNOSIS — M7989 Other specified soft tissue disorders: Secondary | ICD-10-CM

## 2021-06-25 DIAGNOSIS — Z1231 Encounter for screening mammogram for malignant neoplasm of breast: Secondary | ICD-10-CM

## 2021-06-25 DIAGNOSIS — E118 Type 2 diabetes mellitus with unspecified complications: Secondary | ICD-10-CM | POA: Diagnosis not present

## 2021-06-25 DIAGNOSIS — Z7185 Encounter for immunization safety counseling: Secondary | ICD-10-CM

## 2021-06-25 DIAGNOSIS — E1169 Type 2 diabetes mellitus with other specified complication: Secondary | ICD-10-CM

## 2021-06-25 DIAGNOSIS — E785 Hyperlipidemia, unspecified: Secondary | ICD-10-CM

## 2021-06-25 DIAGNOSIS — E039 Hypothyroidism, unspecified: Secondary | ICD-10-CM

## 2021-06-25 DIAGNOSIS — Z1211 Encounter for screening for malignant neoplasm of colon: Secondary | ICD-10-CM | POA: Diagnosis not present

## 2021-06-25 DIAGNOSIS — Z124 Encounter for screening for malignant neoplasm of cervix: Secondary | ICD-10-CM

## 2021-06-25 DIAGNOSIS — I10 Essential (primary) hypertension: Secondary | ICD-10-CM

## 2021-06-25 NOTE — Assessment & Plan Note (Signed)
Referral to vein clinic.  In the meantime needs to be exercising which she is not doing currently.  Continue over-the-counter compression hose for now until she sees vein clinic.  I advised against adding any other diuretic.  We went back over the fact that when she was on another diuretic or higher dose diuretic this past year her kidney marker started to look abnormal.  I do not think this would be a good idea to add back diuretic other than what she is on with her blood pressure medicine currently ?

## 2021-06-25 NOTE — Progress Notes (Signed)
Subjective: ? Kathryn [name] is a 71 y.o. female who presents for ?Chief Complaint  ?Patient presents with  ? other  ?  Med check no other issues pt. Said weight is going up and down and has swelling in her legs from time to time.   ?   ?Medical team: ?Dr. Glenetta Hew, cardiology ?Dr. Juanita Craver, gastroenterology ?Judaea Burgoon, Camelia Eng, PA-C here for primary care ? ? ?Concerns: ?Diabetes - checking glucose every now and then.   No highs or lows.   No polyuria or polydipsia. ? ?Hyperlipidemia - compliatn with medication without side effects. ? ?Hypertension - compliant with medication.  No chest pain.  Gets some lower extremity edema.    ? ?Hypothyroidism - she reports taking medication every moring, but sometimes doesn't take on empty stomach before other medicaoitns.    ? ?Not exercising a lot.  ? ?No other aggravating or relieving factors.   ? ?No other c/o. ? ?Past Medical History:  ?Diagnosis Date  ? Anemia   ? Aortic stenosis 2010  ? Echo 04/2008:  EF 60%, mean AV gradient 7 mmHg.-Remains mild AS by echo in May 2018.  There appears to be a subvalvular component with hyperdynamic LV function.  ? Diabetes mellitus   ? Diverticula of colon   ? per colonoscopy6/20/11  ? Edema of lower extremity   ? wears ted hose  ? H/O echocardiogram 2014  ? EF 65%, slight gradient of aortic valve, no major findings otherwise  ? Hx of cardiovascular stress test   ? Adenosine Myoview 04/2008:  Normal.  ? Hyperlipidemia   ? Hypertension   ? Hypothyroidism   ? LBBB (left bundle branch block)   ? Obesity   ? 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 enlarged heterogeneous thyroid gland on ultrasound  ? Tubulovillous adenoma   ? Wears dentures   ? upper  ? Wears glasses   ? ?Current Outpatient Medications on File Prior to Visit  ?Medication Sig Dispense Refill  ? atorvastatin (LIPITOR) 40 MG tablet Take 1 tablet (40 mg total) by mouth daily. 90 tablet 0  ? cetirizine (ZYRTEC) 10 MG tablet Take 1 tablet (10 mg total) by mouth at bedtime. 30 tablet 11  ?  cholecalciferol (VITAMIN D3) 25 MCG (1000 UNIT) tablet Take 1 tablet (1,000 Units total) by mouth daily. 90 tablet 3  ? fluticasone (FLONASE) 50 MCG/ACT nasal spray Place 2 sprays into both nostrils daily. 16 g 11  ? Insulin Pen Needle (B-D UF III MINI PEN NEEDLES) 31G X 5 MM MISC 1 EACH BY DOES NOT APPLY ROUTE AS DIRECTED. 100 each 11  ? levothyroxine (SYNTHROID) 75 MCG tablet Take 1 tablet (75 mcg total) by mouth daily. 90 tablet 3  ? lisinopril-hydrochlorothiazide (ZESTORETIC) 20-12.5 MG tablet TAKE 1 TABLET BY MOUTH EVERY DAY 90 tablet 1  ? Semaglutide (RYBELSUS) 14 MG TABS Take 14 mg by mouth daily. 90 tablet 3  ? ?No current facility-administered medications on file prior to visit.  ? ? ? ?The following portions of the patient's history were reviewed and updated as appropriate: allergies, current medications, past family history, past medical history, past social history, past surgical history and problem list. ? ?ROS ?Otherwise as in subjective above ? ? ? ?Objective: ?BP 124/78   Pulse 64   Temp 98.3 ?F (36.8 ?C)   Wt 246 lb 6.4 oz (111.8 kg)   LMP  (LMP Unknown)   BMI 41.00 kg/m?  ? ?Wt Readings from Last 3 Encounters:  ?06/25/21 246  lb 6.4 oz (111.8 kg)  ?03/31/21 239 lb 3.2 oz (108.5 kg)  ?12/26/20 238 lb 6.4 oz (108.1 kg)  ? ?General appearance: alert, no distress, well developed, well nourished ?Neck: supple, no lymphadenopathy, no thyromegaly, no masses ?Heart: RRR, normal S1, S2, no murmurs ?Lungs: CTA bilaterally, no wheezes, rhonchi, or rales ?Pulses: 2+ radial pulses, 2+ pedal pulses, normal cap refill ?Ext: 1+ bilat LE mild pitting edema ? ? ? ?Assessment: ?Encounter Diagnoses  ?Name Primary?  ? Diabetes mellitus type 2 with complications ([place]) Yes  ? Screen for colon cancer   ? Encounter for screening mammogram for malignant neoplasm of breast   ? Screening for cervical cancer   ? Vaccine counseling   ? Essential hypertension   ? Hyperlipidemia associated with type 2 diabetes mellitus ([place])    ? Hypothyroidism, unspecified type   ? Varicose veins of both lower extremities, unspecified whether complicated   ? Type 2 diabetes mellitus with other specified complication, unspecified whether long term insulin use ([place])   ? Chronic venous insufficiency   ? Edema, unspecified type   ? ? ? ?Plan: ?You are due for cancer screenings at this time including colonoscopy, mammogram and Pap smear. ? ?We will call with lab results ? ?Problem List Items Addressed This Visit   ? ? Essential hypertension (Chronic)  ?  Continue current therapy ? ?  ?  ? Hypothyroidism  ?  Continue current medication, updated labs today.  Last labs 6 months ago were stable ? ?  ?  ? Relevant Orders  ? TSH + free T4  ? Vaccine counseling  ? Screening for cervical cancer  ?  We will plan to do Pap smear at next visit hopefully ? ?  ?  ? Varicose veins of both lower extremities  ? Chronic venous insufficiency  ?  Referral to vein clinic ? ?  ?  ? Hyperlipidemia associated with type 2 diabetes mellitus ([place])  ? Type 2 diabetes mellitus with other specified complication ([place])  ?  Continue Rybelsus, labs today, needs to work on improving diet and exercising ? ?  ?  ? Diabetes mellitus type 2 with complications (HCC) - Primary  ?  Labs today, continue current medications, continue glucose monitoring, daily foot checks, regular exercise ? ?  ?  ? Relevant Orders  ? Hemoglobin A1c  ? Screen for colon cancer  ? Relevant Orders  ? Ambulatory referral to Gastroenterology  ? Encounter for screening mammogram for malignant neoplasm of breast  ? Relevant Orders  ? MM DIGITAL SCREENING BILATERAL  ? Edema  ?  Referral to vein clinic.  In the meantime needs to be exercising which she is not doing currently.  Continue over-the-counter compression hose for now until she sees vein clinic.  I advised against adding any other diuretic.  We went back over the fact that when she was on another diuretic or higher dose diuretic this past year her kidney marker started  to look abnormal.  I do not think this would be a good idea to add back diuretic other than what she is on with her blood pressure medicine currently ? ?  ?  ? ? ?Follow up: pending labs, referral ?

## 2021-06-25 NOTE — Assessment & Plan Note (Signed)
Labs today, continue current medications, continue glucose monitoring, daily foot checks, regular exercise ?

## 2021-06-25 NOTE — Patient Instructions (Addendum)
Get you tetanus booster vaccine at your pharmacy ? ? ? ?Please call to schedule your mammogram ? ?[facility]  ?[phone] ?[address], [address] ?[place], [place] [address] ? ? ? ?I recommend referral back to Vein Clinic in regards to swelling.  ?

## 2021-06-25 NOTE — Assessment & Plan Note (Signed)
Continue Rybelsus, labs today, needs to work on improving diet and exercising ?

## 2021-06-25 NOTE — Assessment & Plan Note (Signed)
Continue current therapy 

## 2021-06-25 NOTE — Assessment & Plan Note (Signed)
Referral to vein clinic ?

## 2021-06-25 NOTE — Assessment & Plan Note (Signed)
Continue current medication, updated labs today.  Last labs 6 months ago were stable ?

## 2021-06-25 NOTE — Assessment & Plan Note (Signed)
We will plan to do Pap smear at next visit hopefully ?

## 2021-06-26 ENCOUNTER — Other Ambulatory Visit: Payer: Self-pay | Admitting: Medical

## 2021-06-26 LAB — HEMOGLOBIN A1C
Est. average glucose Bld gHb Est-mCnc: 128 mg/dL
Hgb A1c MFr Bld: 6.1 % — ABNORMAL HIGH (ref 4.8–5.6)

## 2021-06-26 LAB — TSH+FREE T4
Free T4: 0.9 ng/dL (ref 0.82–1.77)
TSH: 5.73 u[IU]/mL — ABNORMAL HIGH (ref 0.450–4.500)

## 2021-06-26 MED ORDER — ATORVASTATIN CALCIUM 40 MG PO TABS: 40.0000 mg | ORAL_TABLET | Freq: Every day | ORAL | 3 refills | Status: DC

## 2021-06-26 MED ORDER — LEVOTHYROXINE SODIUM 100 MCG PO TABS: 100.0000 ug | ORAL_TABLET | Freq: Every day | ORAL | 2 refills | Status: DC

## 2021-07-01 ENCOUNTER — Ambulatory Visit
Admission: RE | Admit: 2021-07-01 | Discharge: 2021-07-01 | Disposition: A | Discharge status: Home / Self Care | Payer: Medicare Other | Source: Ambulatory Visit | Attending: Medical | Admitting: Medical

## 2021-07-01 DIAGNOSIS — Z1231 Encounter for screening mammogram for malignant neoplasm of breast: Secondary | ICD-10-CM

## 2021-07-02 ENCOUNTER — Encounter: Payer: Self-pay | Admitting: Internal Medicine

## 2021-08-07 ENCOUNTER — Ambulatory Visit (INDEPENDENT_AMBULATORY_CARE_PROVIDER_SITE_OTHER): Payer: Medicare Other | Admitting: Medical

## 2021-08-07 ENCOUNTER — Encounter: Payer: Self-pay | Admitting: Medical

## 2021-08-07 ENCOUNTER — Other Ambulatory Visit (HOSPITAL_COMMUNITY)
Admission: RE | Admit: 2021-08-07 | Discharge: 2021-08-07 | Disposition: A | Discharge status: Home / Self Care | Payer: Medicare Other | Source: Ambulatory Visit | Attending: Medical | Admitting: Medical

## 2021-08-07 VITALS — BP 110/70 | HR 63 | Wt 244.2 lb

## 2021-08-07 DIAGNOSIS — E1169 Type 2 diabetes mellitus with other specified complication: Secondary | ICD-10-CM | POA: Diagnosis not present

## 2021-08-07 DIAGNOSIS — Z6841 Body Mass Index (BMI) 40.0 and over, adult: Secondary | ICD-10-CM

## 2021-08-07 DIAGNOSIS — E039 Hypothyroidism, unspecified: Secondary | ICD-10-CM | POA: Diagnosis not present

## 2021-08-07 DIAGNOSIS — Z1211 Encounter for screening for malignant neoplasm of colon: Secondary | ICD-10-CM

## 2021-08-07 DIAGNOSIS — Z1151 Encounter for screening for human papillomavirus (HPV): Secondary | ICD-10-CM | POA: Insufficient documentation

## 2021-08-07 DIAGNOSIS — Z124 Encounter for screening for malignant neoplasm of cervix: Secondary | ICD-10-CM | POA: Diagnosis present

## 2021-08-07 MED ORDER — TIRZEPATIDE 7.5 MG/0.5ML ~~LOC~~ SOAJ: 7.5000 mg | SUBCUTANEOUS | 1 refills | Status: DC

## 2021-08-07 NOTE — Progress Notes (Signed)
Subjective:  Kathryn [name] is a 71 y.o. female who presents for Chief Complaint  Patient presents with   6 follow-up    6 week follow-up needs pap smear and would like mounjaro weight loss medication. Due for colonoscopy as well     Here for Pap smear.  She has history of 5 pregnancies, 5 live births.  Not sexually active currently in the past year.  No concern for STD.  She had a mammogram in May.  No breast concerns.  Last visit we referred for colonoscopy as she is due.  She did go down to Dr. Kenna Gilbert office but apparently owes some money.  She will get this worked out so she can get her updated colonoscopy  She would like to switch to [place].  She is doing Rybelsus but is not losing any weight.  She is exercising some.  She does have some diet indiscretion  No other aggravating or relieving factors.    No other c/o.  Past Medical History:  Diagnosis Date   Anemia    Aortic stenosis 2010   Echo 04/2008:  EF 60%, mean AV gradient 7 mmHg.-Remains mild AS by echo in May 2018.  There appears to be a subvalvular component with hyperdynamic LV function.   Diabetes mellitus    Diverticula of colon    per colonoscopy6/20/11   Edema of lower extremity    wears ted hose   H/O echocardiogram 2014   EF 65%, slight gradient of aortic valve, no major findings otherwise   Hx of cardiovascular stress test    Adenosine Myoview 04/2008:  Normal.   Hyperlipidemia    Hypertension    Hypothyroidism    LBBB (left bundle branch block)    Obesity    Thyromegaly 04/23/08   enlarged heterogeneous thyroid gland on ultrasound   Tubulovillous adenoma    Wears dentures    upper   Wears glasses    Current Outpatient Medications on File Prior to Visit  Medication Sig Dispense Refill   atorvastatin (LIPITOR) 40 MG tablet Take 1 tablet (40 mg total) by mouth daily. 90 tablet 3   cetirizine (ZYRTEC) 10 MG tablet Take 1 tablet (10 mg total) by mouth at bedtime. 30 tablet 11   cholecalciferol  (VITAMIN D3) 25 MCG (1000 UNIT) tablet Take 1 tablet (1,000 Units total) by mouth daily. 90 tablet 3   fluticasone (FLONASE) 50 MCG/ACT nasal spray Place 2 sprays into both nostrils daily. 16 g 11   levothyroxine (SYNTHROID) 100 MCG tablet Take 1 tablet (100 mcg total) by mouth daily. 30 tablet 2   lisinopril-hydrochlorothiazide (ZESTORETIC) 20-12.5 MG tablet TAKE 1 TABLET BY MOUTH EVERY DAY 90 tablet 1   Insulin Pen Needle (B-D UF III MINI PEN NEEDLES) 31G X 5 MM MISC 1 EACH BY DOES NOT APPLY ROUTE AS DIRECTED. 100 each 11   No current facility-administered medications on file prior to visit.     The following portions of the patient's history were reviewed and updated as appropriate: allergies, current medications, past family history, past medical history, past social history, past surgical history and problem list.  ROS Otherwise as in subjective above  Objective: BP 110/70   Pulse 63   Wt 244 lb 3.2 oz (110.8 kg)   LMP  (LMP Unknown)   BMI 40.64 kg/m   General appearance: alert, no distress, well developed, well nourished Breast - declined Gyn: Normal external genitalia without lesions, vagina with normal mucosa, cervix without lesions, no cervical  motion tenderness, no abnormal vaginal discharge.  Uterus and adnexa not enlarged, nontender, no masses.  Pap performed.  Exam chaperoned by nurse. Rectal: deferred    Assessment: Encounter Diagnoses  Name Primary?   Type 2 diabetes mellitus with other specified complication, unspecified whether long term insulin use (HCC) Yes   Screening for cervical cancer    BMI 40.0-44.9, adult (HCC)    Hypothyroidism, unspecified type    Screen for colon cancer      Plan: Advise she try to go ahead and get in for her colonoscopy soon.  Follow-up with gastroenterology from a recent referral  Pap smear sent today.  Obesity and diabetes with comorbid hypothyroidism   Kathryn [name] was seen today for 6 follow-up.  Diagnoses and all orders  for this visit:  Type 2 diabetes mellitus with other specified complication, unspecified whether long term insulin use (HCC)  Screening for cervical cancer -     Cytology - PAP([place])  BMI 40.0-44.9, adult (HCC)  Hypothyroidism, unspecified type  Screen for colon cancer  Other orders -     tirzepatide (MOUNJARO) 7.5 MG/0.5ML Pen; Inject 7.5 mg into the skin once a week.    Follow up: f/u 6wk on weight

## 2021-08-07 NOTE — Patient Instructions (Signed)
Try to go ahead and get in for colonoscopy soon.    Pap smear sent today.  Obesity and diabetes with comorbid hypothyroidism  Assuming we can get the [facility] covered by insurance she will begin this and stop Rybelsus.  The Greggory Keen is a once weekly injection.  The plan will be to increase the dose after 1 month.  Lets see you back in 1 month   I recommend you exercise a minimum of 4 days/week such as 150 minutes/week or an hour 4 days a week of exercise such as walking or hiking or other exercise.    Breakfast You may eat 1 of the following Omelette, which can include a small amount of cheese, and vegetables such as peppers, mushrooms, small pieces of [place] or chicken Low sugar yogurt serving which can include some fruit such as berries Egg whites or hard boiled egg and meat (1-2 strips of bacon, or small piece of [place] sausage or [place] bacon)   Lunch A protein source such as 1 of the following: 1 serving of beans such as black beans, pinto beans, green beans, or edamame (soy beans) 1 meat serving such as 6 oz or deck of card size serving of fish, skinless chicken, or [place], either grilled or baked preferably.   You can use some pork or beef, but limit this compared to fish, chicken or [place] Vegetable - Half of your plate should be a non-starchy vegetables!  So avoid white potatoes and corn.  Otherwise, eat a large portion of vegetables. Avocado, cucumber, tomato, carrots, greens, lettuce, squash, okra, etc.  Vegetables can include salad with olive oil/vinaigrette dressing   Mid-afternoon snack 1 fruit serving such as one of the following: medium-sized apple medium-sized orange, Tangerine 1/2 banana  3/4 cup of fresh berries or frozen berries  A protein source such as one of the following: 8 almonds  small handful of walnuts or other nuts small piece of cheese, low sugar yogurt   Dinner A protein source such as 1 of the following: 1 serving of beans such as black  beans, pinto beans, green beans, or edamame (soy beans) 1 meat serving such as 6 oz or deck of card size serving of fish, skinless chicken, or [place], either grilled or baked preferably.   You can use some pork or beef, but limit this compared to fish, chicken or [place] Vegetable - Half of your plate should be a non-starchy vegetables!  So avoid white potatoes and corn.  Otherwise, eat a large portion of vegetables. Avocado, cucumber, tomato, carrots, greens, lettuce, squash, okra, etc.  Vegetables can include salad with olive oil/vinaigrette dressing   Beverages: Water Unsweet tea Home made juice with a juicer without sugar added other than small bit of honey or agave nectar Water with sugar free flavor such as [place]   AVOID.... For the time being I want you to cut out the following items completely: Soda, sweet tea, juice, beer or wine or alcohol ALL grains and breads including rice, pasta, bread, cereal Sweets such as cake, candy, pies, chips, cookies, chocolate

## 2021-08-12 LAB — CYTOLOGY - PAP
Adequacy: ABSENT
Chlamydia: NEGATIVE
Comment: NEGATIVE
Comment: NEGATIVE
Comment: NORMAL
Diagnosis: NEGATIVE
High risk HPV: NEGATIVE
Neisseria Gonorrhea: NEGATIVE

## 2021-09-05 ENCOUNTER — Ambulatory Visit (INDEPENDENT_AMBULATORY_CARE_PROVIDER_SITE_OTHER): Payer: Medicare Other | Admitting: Medical

## 2021-09-05 ENCOUNTER — Other Ambulatory Visit: Payer: Self-pay | Admitting: Medical

## 2021-09-05 ENCOUNTER — Encounter: Payer: Self-pay | Admitting: Medical

## 2021-09-05 VITALS — BP 110/70 | HR 65 | Wt 231.6 lb

## 2021-09-05 DIAGNOSIS — Z6838 Body mass index (BMI) 38.0-38.9, adult: Secondary | ICD-10-CM | POA: Diagnosis not present

## 2021-09-05 DIAGNOSIS — I10 Essential (primary) hypertension: Secondary | ICD-10-CM | POA: Diagnosis not present

## 2021-09-05 DIAGNOSIS — E1169 Type 2 diabetes mellitus with other specified complication: Secondary | ICD-10-CM

## 2021-09-05 DIAGNOSIS — E118 Type 2 diabetes mellitus with unspecified complications: Secondary | ICD-10-CM

## 2021-09-05 DIAGNOSIS — E039 Hypothyroidism, unspecified: Secondary | ICD-10-CM | POA: Diagnosis not present

## 2021-09-05 DIAGNOSIS — R4189 Other symptoms and signs involving cognitive functions and awareness: Secondary | ICD-10-CM

## 2021-09-05 DIAGNOSIS — R413 Other amnesia: Secondary | ICD-10-CM

## 2021-09-05 DIAGNOSIS — R0989 Other specified symptoms and signs involving the circulatory and respiratory systems: Secondary | ICD-10-CM

## 2021-09-05 MED ORDER — TIRZEPATIDE 10 MG/0.5ML ~~LOC~~ SOAJ: 10.0000 mg | SUBCUTANEOUS | 0 refills | Status: DC

## 2021-09-05 MED ORDER — TIRZEPATIDE 12.5 MG/0.5ML ~~LOC~~ SOAJ: 12.5000 mg | SUBCUTANEOUS | 0 refills | Status: DC

## 2021-09-05 NOTE — Patient Instructions (Addendum)
Memory concerns Please call [facility] as they have tried to contact you multiple times since referral was placed back in February.  Phone number: [phone]  We will call back about your thyroid lab results.  Continue thyroid medicine daily fasting first in the morning 45 to 60 minutes before breakfast  Continue Mounjaro as this is working well.  Take the 10 mg dose for the next months.  After 1 month  increase to 12.5 mg dose.  I want you to call back or recheck in 2 months to let me know how you are doing with the higher dose and what your weight is showing.

## 2021-09-05 NOTE — Progress Notes (Signed)
Subjective:  Kathryn [name] is a 71 y.o. female who presents for Chief Complaint  Patient presents with   Follow-up    1 month follow up. Needs refill on Thyroid meds.     Here for med check.  Last visit June 15 we made a change in medication.  She was on Rybelsus but wanted to change the [facility] which he did.  She is tolerating this just fine and has lost some weight.  She wants to go up to the next visit.  She is exercising somewhat walking.  She notes that she is checking her glucose no recent worrisome findings.  She notes that she actually had gotten down to 225 pounds.  Back a few months ago we increased her thyroid dose.  She is tolerating levothyroxine 100 mcg just fine.  Here for recheck lab wellness.  We discussed her memory concerns that she had raised several months ago.  Referral was placed to neurology back in February 2023.  She still has not seen neurology.  No other aggravating or relieving factors.    No other c/o.  The following portions of the patient's history were reviewed and updated as appropriate: allergies, current medications, past family history, past medical history, past social history, past surgical history and problem list.  ROS Otherwise as in subjective above    Objective: BP 110/70   Pulse 65   Wt 231 lb 9.6 oz (105.1 kg)   LMP  (LMP Unknown)   SpO2 99%   BMI 38.54 kg/m   General appearance: alert, no distress, well developed, well nourished Otherwise not examined    Assessment: Encounter Diagnoses  Name Primary?   Hypothyroidism, unspecified type Yes   Type 2 diabetes mellitus with other specified complication, unspecified whether long term insulin use (HCC)    BMI 38.0-38.9,adult    Essential hypertension    Memory change    Impaired cognitive perception pattern      Plan: Hypothyroidism-updated labs today since increasing to 100 mcg a few months ago.  Diabetes, obesity-doing well on Mounjaro.  We changed from Rybelsus to  [facility] last visit.  She will continue efforts to lose weight through healthy diet and exercise.  I sent the next higher dose to take for the next month, Mounjaro 10 mg weekly.  After 1 month she will go up to 12.5 mg.  Hypertension-continue current medication lisinopril HCT  Memory change, impaired cognition-currently investigated referral, neurology has reached out her left voicemails 3 times.  We gave her the phone number to call them to get an appointment set up  [name] was seen today for follow-up.  Diagnoses and all orders for this visit:  Hypothyroidism, unspecified type -     TSH + free T4  Type 2 diabetes mellitus with other specified complication, unspecified whether long term insulin use (HCC)  BMI 38.0-38.9,adult  Essential hypertension  Memory change  Impaired cognitive perception pattern  Other orders -     tirzepatide (MOUNJARO) 10 MG/0.5ML Pen; Inject 10 mg into the skin once a week. -     tirzepatide (MOUNJARO) 12.5 MG/0.5ML Pen; Inject 12.5 mg into the skin once a week. Begin after a month of the 10mg  dose    Follow up: call report 27mo

## 2021-09-06 LAB — TSH+FREE T4
Free T4: 2.06 ng/dL — ABNORMAL HIGH (ref 0.82–1.77)
TSH: <0.005 u[IU]/mL — ABNORMAL LOW (ref 0.450–4.500)

## 2021-09-07 ENCOUNTER — Other Ambulatory Visit: Payer: Self-pay | Admitting: Medical

## 2021-09-07 MED ORDER — LEVOTHYROXINE SODIUM 88 MCG PO TABS: 88.0000 ug | ORAL_TABLET | Freq: Every day | ORAL | 1 refills | Status: DC

## 2021-09-12 ENCOUNTER — Telehealth: Payer: Self-pay | Admitting: Internal Medicine

## 2021-09-12 NOTE — Telephone Encounter (Signed)
Donna with Cardiovascular called and wants to know if ABIs require PA. Call her back at [phone] and let her know 

## 2021-09-23 ENCOUNTER — Other Ambulatory Visit: Payer: Self-pay | Admitting: Medical

## 2021-10-07 ENCOUNTER — Ambulatory Visit (HOSPITAL_COMMUNITY)
Admission: RE | Admit: 2021-10-07 | Discharge: 2021-10-07 | Disposition: A | Discharge status: Home / Self Care | Payer: Medicare Other | Source: Ambulatory Visit | Attending: Medical | Admitting: Medical

## 2021-10-07 DIAGNOSIS — E118 Type 2 diabetes mellitus with unspecified complications: Secondary | ICD-10-CM | POA: Insufficient documentation

## 2021-10-07 DIAGNOSIS — R0989 Other specified symptoms and signs involving the circulatory and respiratory systems: Secondary | ICD-10-CM

## 2021-10-07 NOTE — Progress Notes (Signed)
ABI/TBI study completed. Please see CV Proc for preliminary results.  Imaad Reuss BS, RVT 10/07/2021 10:34 AM

## 2021-10-08 ENCOUNTER — Encounter: Payer: Self-pay | Admitting: Internal Medicine

## 2021-10-17 ENCOUNTER — Telehealth: Payer: Self-pay

## 2021-10-17 MED ORDER — TIRZEPATIDE 12.5 MG/0.5ML ~~LOC~~ SOAJ: 12.5000 mg | SUBCUTANEOUS | 0 refills | Status: DC

## 2021-10-17 MED ORDER — LISINOPRIL-HYDROCHLOROTHIAZIDE 20-12.5 MG PO TABS: 1.0000 | ORAL_TABLET | Freq: Every day | ORAL | 0 refills | Status: DC

## 2021-10-17 NOTE — Telephone Encounter (Signed)
I have resent meds from local pharmacy to [facility] pharmacy

## 2021-10-17 NOTE — Telephone Encounter (Signed)
Received fax from [facility] for a refill on her [facility] and Lisinopril/hctz. Last apt was 09/05/21 next apt is 12/30/21.

## 2021-11-19 ENCOUNTER — Other Ambulatory Visit: Payer: Self-pay | Admitting: Medical

## 2021-11-19 NOTE — Telephone Encounter (Signed)
90 day supply was sent in on 09/2021

## 2021-11-20 ENCOUNTER — Other Ambulatory Visit: Payer: Self-pay | Admitting: Medical

## 2021-12-05 ENCOUNTER — Other Ambulatory Visit: Payer: Self-pay | Admitting: Medical

## 2021-12-09 ENCOUNTER — Telehealth: Payer: Self-pay | Admitting: Medical

## 2021-12-09 NOTE — Telephone Encounter (Signed)
Tried calling patient to schedule awv prior to appt with provider  No answer.  Could not leave voice mail

## 2021-12-15 ENCOUNTER — Encounter: Payer: Self-pay | Admitting: Family Medicine

## 2021-12-15 ENCOUNTER — Ambulatory Visit (INDEPENDENT_AMBULATORY_CARE_PROVIDER_SITE_OTHER): Payer: Medicare Other | Admitting: Family Medicine

## 2021-12-15 VITALS — BP 108/64 | HR 68 | Temp 98.1°F | Ht 66.0 in | Wt 196.0 lb

## 2021-12-15 DIAGNOSIS — Z23 Encounter for immunization: Secondary | ICD-10-CM | POA: Diagnosis not present

## 2021-12-15 DIAGNOSIS — K1379 Other lesions of oral mucosa: Secondary | ICD-10-CM | POA: Diagnosis not present

## 2021-12-15 MED ORDER — LIDOCAINE VISCOUS HCL 2 % MT SOLN: 5.0000 mL | OROMUCOSAL | 0 refills | Status: AC | PRN

## 2021-12-15 NOTE — Progress Notes (Signed)
Chief Complaint  Patient presents with   Oral Pain    Mouth sores x 2 weeks. Using salt water rinses. She is asking for an abx, just in case.    She has partial upper and lower dentures. She reports that her gums get sore periodically. 2 weeks ago she noted the pain recurring on the lower left gums.  Seen 07/2020 with aphthous ulcer, treated with lidocaine ointment. She is asking for antibiotics.  She reports she recently had a cold, but symptoms completely resolved.   PMH, PSH, SH reviewed  Outpatient Encounter Medications as of 12/15/2021  Medication Sig   atorvastatin (LIPITOR) 40 MG tablet Take 1 tablet (40 mg total) by mouth daily.   cetirizine (ZYRTEC) 10 MG tablet Take 1 tablet (10 mg total) by mouth at bedtime.   cholecalciferol (VITAMIN D3) 25 MCG (1000 UNIT) tablet Take 1 tablet (1,000 Units total) by mouth daily.   Insulin Pen Needle (B-D UF III MINI PEN NEEDLES) 31G X 5 MM MISC 1 EACH BY DOES NOT APPLY ROUTE AS DIRECTED.   levothyroxine (SYNTHROID) 88 MCG tablet Take 1 tablet (88 mcg total) by mouth daily.   lisinopril-hydrochlorothiazide (ZESTORETIC) 20-12.5 MG tablet TAKE 1 TABLET BY MOUTH DAILY   MOUNJARO 12.5 MG/0.5ML Pen INJECT 12.5 MG INTO THE SKIN ONCE A WEEK. BEGIN AFTER A MONTH OF THE 10MG  DOSE   fluticasone (FLONASE) 50 MCG/ACT nasal spray Place 2 sprays into both nostrils daily. (Patient not taking: Reported on 12/15/2021)   No facility-administered encounter medications on file as of 12/15/2021.   Allergies  Allergen Reactions   Codeine    ROS: no fever, chills.  No current URI symptoms. Denies current cough, shortness of breath, chest pain. Denies rashes (healing on her R wrist), no bleeding, bruising.    PHYSICAL EXAM:  BP 108/64   Pulse 68   Temp 98.1 F (36.7 C) (Tympanic)   Ht 5\' 6"  (1.676 m)   Wt 196 lb (88.9 kg)   LMP  (LMP Unknown)   BMI 31.64 kg/m   Pleasant female--somewhat difficult historian HEENT: conjunctiva and sclera are clear,  EOMI OP--Upper and Lower dentures.  Minimal area of irritation along the inferior aspect of the left lower denture No true aphthous ulcer is noted Gums appear normal Neck: no lymphadenopathy Heart: regular rate and rhythm, with 3/6 SEM Lungs: clear bilaterally Neuro: alert and oriented. Normal gait.   ASSESSMENT/PLAN:  Mouth pain - gum irritation from her dentures, no true ulcer. Encouraged use of coconut oil to area of irritation. Pt wanted RF of lidocaine. - Plan: lidocaine (XYLOCAINE) 2 % solution  Need for influenza vaccination - Plan: Flu Vaccine QUAD High Dose(Fluad)  Need for COVID-19 vaccine - Plan: [place] Fall 2023 Covid-19 Vaccine 56yrs and older   You don't have a mouth ulcer this time, just an area of irritation related to your dentures.  I recommend following up with the folks who made your dentures (denturist) if you have ongoing irritation/soreness.  It may need to be adjusted.  I recommend trying coconut oil, rubbing it on the area as needed for discomfort. If this isn't helping, and it is very painful, you can use the lidocaine (which numbs the area).

## 2021-12-15 NOTE — Patient Instructions (Signed)
  You don't have a mouth ulcer this time, just an area of irritation related to your dentures.  I recommend following up with the folks who made your dentures (denturist) if you have ongoing irritation/soreness.  It may need to be adjusted.  I recommend trying coconut oil, rubbing it on the area as needed for discomfort. If this isn't helping, and it is very painful, you can use the lidocaine ointment (which numbs the area).

## 2021-12-21 ENCOUNTER — Other Ambulatory Visit: Payer: Self-pay | Admitting: Medical

## 2021-12-30 ENCOUNTER — Ambulatory Visit: Payer: Medicare Other | Admitting: Medical

## 2022-01-30 ENCOUNTER — Other Ambulatory Visit: Payer: Self-pay | Admitting: Medical

## 2022-02-15 ENCOUNTER — Other Ambulatory Visit: Payer: Self-pay | Admitting: Medical

## 2022-02-17 NOTE — Telephone Encounter (Signed)
Has upcoming appt in February 2024

## 2022-03-02 ENCOUNTER — Telehealth: Payer: Self-pay | Admitting: Internal Medicine

## 2022-03-02 MED ORDER — LEVOTHYROXINE SODIUM 88 MCG PO TABS: 88.0000 ug | ORAL_TABLET | Freq: Every day | ORAL | 0 refills | Status: DC

## 2022-03-02 NOTE — Telephone Encounter (Signed)
Pt needs refill on levothryoxine . This has been sent to pharmacy

## 2022-03-12 ENCOUNTER — Other Ambulatory Visit: Payer: Self-pay | Admitting: Medical

## 2022-03-24 ENCOUNTER — Ambulatory Visit (INDEPENDENT_AMBULATORY_CARE_PROVIDER_SITE_OTHER): Payer: 59

## 2022-03-24 VITALS — Ht 66.0 in | Wt 174.0 lb

## 2022-03-24 DIAGNOSIS — Z0000 Encounter for general adult medical examination without abnormal findings: Secondary | ICD-10-CM

## 2022-03-24 NOTE — Patient Instructions (Signed)
Kathryn [name] , Thank you for taking time to come for your Medicare Wellness Visit. I appreciate your ongoing commitment to your health goals. Please review the following plan we discussed and let me know if I can assist you in the future.   These are the goals we discussed:  Goals   None     This is a list of the screening recommended for you and due dates:  Health Maintenance  Topic Date Due   Medicare Annual Wellness Visit  05/29/2018   Colon Cancer Screening  09/03/2020   Eye exam for diabetics  08/19/2021   Yearly kidney health urinalysis for diabetes  12/26/2021   Complete foot exam   12/26/2021   Hemoglobin A1C  12/26/2021   Yearly kidney function blood test for diabetes  03/31/2022   Mammogram  07/02/2023   DTaP/Tdap/Td vaccine (3 - Td or Tdap) 06/26/2031   Pneumonia Vaccine  Completed   Flu Shot  Completed   DEXA scan (bone density measurement)  Completed   COVID-19 Vaccine  Completed   Hepatitis C Screening: USPSTF Recommendation to screen - Ages 33-79 yo.  Completed   Zoster (Shingles) Vaccine  Completed   HPV Vaccine  Aged Out    Advanced directives: copy in chart  Conditions/risks identified: none  Next appointment: Follow up in one year for your annual wellness visit    Preventive Care 65 Years and Older, Female Preventive care refers to lifestyle choices and visits with your health care provider that can promote health and wellness. What does preventive care include? A yearly physical exam. This is also called an annual well check. Dental exams once or twice a year. Routine eye exams. Ask your health care provider how often you should have your eyes checked. Personal lifestyle choices, including: Daily care of your teeth and gums. Regular physical activity. Eating a healthy diet. Avoiding tobacco and drug use. Limiting alcohol use. Practicing safe sex. Taking low-dose aspirin every day. Taking vitamin and mineral supplements as recommended by your  health care provider. What happens during an annual well check? The services and screenings done by your health care provider during your annual well check will depend on your age, overall health, lifestyle risk factors, and family history of disease. Counseling  Your health care provider may ask you questions about your: Alcohol use. Tobacco use. Drug use. Emotional well-being. Home and relationship well-being. Sexual activity. Eating habits. History of falls. Memory and ability to understand (cognition). Work and work Statistician. Reproductive health. Screening  You may have the following tests or measurements: Height, weight, and BMI. Blood pressure. Lipid and cholesterol levels. These may be checked every 5 years, or more frequently if you are over 23 years old. Skin check. Lung cancer screening. You may have this screening every year starting at age 48 if you have a 30-pack-year history of smoking and currently smoke or have quit within the past 15 years. Fecal occult blood test (FOBT) of the stool. You may have this test every year starting at age 37. Flexible sigmoidoscopy or colonoscopy. You may have a sigmoidoscopy every 5 years or a colonoscopy every 10 years starting at age 20. Hepatitis C blood test. Hepatitis B blood test. Sexually transmitted disease (STD) testing. Diabetes screening. This is done by checking your blood sugar (glucose) after you have not eaten for a while (fasting). You may have this done every 1-3 years. Bone density scan. This is done to screen for osteoporosis. You may have this done starting  at age 92. Mammogram. This may be done every 1-2 years. Talk to your health care provider about how often you should have regular mammograms. Talk with your health care provider about your test results, treatment options, and if necessary, the need for more tests. Vaccines  Your health care provider may recommend certain vaccines, such as: Influenza vaccine.  This is recommended every year. Tetanus, diphtheria, and acellular pertussis (Tdap, Td) vaccine. You may need a Td booster every 10 years. Zoster vaccine. You may need this after age 3. Pneumococcal 13-valent conjugate (PCV13) vaccine. One dose is recommended after age 46. Pneumococcal polysaccharide (PPSV23) vaccine. One dose is recommended after age 14. Talk to your health care provider about which screenings and vaccines you need and how often you need them. This information is not intended to replace advice given to you by your health care provider. Make sure you discuss any questions you have with your health care provider. Document Released: 03/08/2015 Document Revised: 10/30/2015 Document Reviewed: 12/11/2014 Elsevier Interactive Patient Education  2017 [place] Prevention in the Home Falls can cause injuries. They can happen to people of all ages. There are many things you can do to make your home safe and to help prevent falls. What can I do on the outside of my home? Regularly fix the edges of walkways and driveways and fix any cracks. Remove anything that might make you trip as you walk through a door, such as a raised step or threshold. Trim any bushes or trees on the path to your home. Use bright outdoor lighting. Clear any walking paths of anything that might make someone trip, such as rocks or tools. Regularly check to see if handrails are loose or broken. Make sure that both sides of any steps have handrails. Any raised decks and porches should have guardrails on the edges. Have any leaves, snow, or ice cleared regularly. Use sand or salt on walking paths during winter. Clean up any spills in your garage right away. This includes oil or grease spills. What can I do in the bathroom? Use night lights. Install grab bars by the toilet and in the tub and shower. Do not use towel bars as grab bars. Use non-skid mats or decals in the tub or shower. If you need to sit  down in the shower, use a plastic, non-slip stool. Keep the floor dry. Clean up any water that spills on the floor as soon as it happens. Remove soap buildup in the tub or shower regularly. Attach bath mats securely with double-sided non-slip rug tape. Do not have throw rugs and other things on the floor that can make you trip. What can I do in the bedroom? Use night lights. Make sure that you have a light by your bed that is easy to reach. Do not use any sheets or blankets that are too big for your bed. They should not hang down onto the floor. Have a firm chair that has side arms. You can use this for support while you get dressed. Do not have throw rugs and other things on the floor that can make you trip. What can I do in the kitchen? Clean up any spills right away. Avoid walking on wet floors. Keep items that you use a lot in easy-to-reach places. If you need to reach something above you, use a strong step stool that has a grab bar. Keep electrical cords out of the way. Do not use floor polish or wax that makes  floors slippery. If you must use wax, use non-skid floor wax. Do not have throw rugs and other things on the floor that can make you trip. What can I do with my stairs? Do not leave any items on the stairs. Make sure that there are handrails on both sides of the stairs and use them. Fix handrails that are broken or loose. Make sure that handrails are as long as the stairways. Check any carpeting to make sure that it is firmly attached to the stairs. Fix any carpet that is loose or worn. Avoid having throw rugs at the top or bottom of the stairs. If you do have throw rugs, attach them to the floor with carpet tape. Make sure that you have a light switch at the top of the stairs and the bottom of the stairs. If you do not have them, ask someone to add them for you. What else can I do to help prevent falls? Wear shoes that: Do not have high heels. Have rubber bottoms. Are  comfortable and fit you well. Are closed at the toe. Do not wear sandals. If you use a stepladder: Make sure that it is fully opened. Do not climb a closed stepladder. Make sure that both sides of the stepladder are locked into place. Ask someone to hold it for you, if possible. Clearly mark and make sure that you can see: Any grab bars or handrails. First and last steps. Where the edge of each step is. Use tools that help you move around (mobility aids) if they are needed. These include: Canes. Walkers. Scooters. Crutches. Turn on the lights when you go into a dark area. Replace any light bulbs as soon as they burn out. Set up your furniture so you have a clear path. Avoid moving your furniture around. If any of your floors are uneven, fix them. If there are any pets around you, be aware of where they are. Review your medicines with your doctor. Some medicines can make you feel dizzy. This can increase your chance of falling. Ask your doctor what other things that you can do to help prevent falls. This information is not intended to replace advice given to you by your health care provider. Make sure you discuss any questions you have with your health care provider. Document Released: 12/06/2008 Document Revised: 07/18/2015 Document Reviewed: 03/16/2014 Elsevier Interactive Patient Education  2017 [employer].

## 2022-03-24 NOTE — Progress Notes (Signed)
I connected with Kathryn [name] today by telephone and verified that I am speaking with the correct person using two identifiers. Location patient: home Location provider: work Persons participating in the virtual visit: Kathryn [name], Glenna Durand LPN.   I discussed the limitations, risks, security and privacy concerns of performing an evaluation and management service by telephone and the availability of in person appointments. I also discussed with the patient that there may be a patient responsible charge related to this service. The patient expressed understanding and verbally consented to this telephonic visit.    Interactive audio and video telecommunications were attempted between this provider and patient, however failed, due to patient having technical difficulties OR patient did not have access to video capability.  We continued and completed visit with audio only.     Vital signs may be patient reported or missing.  Subjective:   Kathryn [name] is a 72 y.o. female who presents for Medicare Annual (Subsequent) preventive examination.  Review of Systems     Cardiac Risk Factors include: advanced age (>80men, >1 women);diabetes mellitus;dyslipidemia;hypertension     Objective:    Today's Vitals   03/24/22 1326  Weight: 174 lb (78.9 kg)  Height: 5\' 6"  (1.676 m)   Body mass index is 28.08 kg/m.     03/24/2022    1:32 PM 05/28/2017    9:56 AM  Advanced Directives  Does Patient Have a Medical Advance Directive? Yes No  Type of Paramedic of [place];Living will   Copy of [place] in Chart? Yes - validated most recent copy scanned in chart (See row information)   Would patient like information on creating a medical advance directive?  Yes (ED - Information included in AVS)    Current Medications (verified) Outpatient Encounter Medications as of 03/24/2022  Medication Sig   atorvastatin (LIPITOR) 40 MG tablet Take 1  tablet (40 mg total) by mouth daily.   cetirizine (ZYRTEC) 10 MG tablet Take 1 tablet (10 mg total) by mouth at bedtime.   cholecalciferol (VITAMIN D3) 25 MCG (1000 UNIT) tablet Take 1 tablet (1,000 Units total) by mouth daily.   Insulin Pen Needle (B-D UF III MINI PEN NEEDLES) 31G X 5 MM MISC 1 EACH BY DOES NOT APPLY ROUTE AS DIRECTED.   levothyroxine (SYNTHROID) 88 MCG tablet Take 1 tablet (88 mcg total) by mouth daily.   lisinopril-hydrochlorothiazide (ZESTORETIC) 20-12.5 MG tablet TAKE 1 TABLET BY MOUTH DAILY   MOUNJARO 12.5 MG/0.5ML Pen INJECT 12.5 MG INTO THE SKIN  ONCE A WEEK. BEGIN AFTER A MONTH OF THE 10MG  DOSE   fluticasone (FLONASE) 50 MCG/ACT nasal spray Place 2 sprays into both nostrils daily. (Patient not taking: Reported on 12/15/2021)   lidocaine (XYLOCAINE) 2 % solution Use as directed 5-15 mLs in the mouth or throat every 4 (four) hours as needed for mouth pain. (Patient not taking: Reported on 03/24/2022)   No facility-administered encounter medications on file as of 03/24/2022.    Allergies (verified) Codeine   History: Past Medical History:  Diagnosis Date   Anemia    Aortic stenosis 2010   Echo 04/2008:  EF 60%, mean AV gradient 7 mmHg.-Remains mild AS by echo in May 2018.  There appears to be a subvalvular component with hyperdynamic LV function.   Diabetes mellitus    Diverticula of colon    per colonoscopy6/20/11   Edema of lower extremity    wears ted hose   H/O echocardiogram 2014   EF  65%, slight gradient of aortic valve, no major findings otherwise   Hx of cardiovascular stress test    Adenosine Myoview 04/2008:  Normal.   Hyperlipidemia    Hypertension    Hypothyroidism    LBBB (left bundle branch block)    Obesity    Thyromegaly 04/23/08   enlarged heterogeneous thyroid gland on ultrasound   Tubulovillous adenoma    Wears dentures    upper   Wears glasses    Past Surgical History:  Procedure Laterality Date   CARPAL TUNNEL RELEASE     bilat    COLECTOMY  08/13/09   laparoscopic converted to open transverse colectomy w/ side to side anastomosis   COLONOSCOPY  09/01/10   Dr. Charna Elizabeth; polyp, tubular adenoma; repeat 3 years   LASIK     TRANSTHORACIC ECHOCARDIOGRAM  11/2012; 06/2016   a. EF~65%. Mild AS. Mean gradient 13 mmHg. Peak 20 mmHg;; b. Normal LV size and function.  EF 60-65%.  Possible mild LVOT gradient.   VEIN SURGERY  2010   right leg   Family History  Problem Relation Age of Onset   CAD Mother        s/p PCI   Cancer Father        stomach cancer   Diabetes Sister    CAD Sister        s/p PCI   Breast cancer Sister        97s   CAD Brother        s/p PCI   Social History   Socioeconomic History   Marital status: Divorced    Spouse name: Not on file   Number of children: Not on file   Years of education: Not on file   Highest education level: Not on file  Occupational History   Not on file  Tobacco Use   Smoking status: Never   Smokeless tobacco: Never  Vaping Use   Vaping Use: Never used  Substance and Sexual Activity   Alcohol use: No   Drug use: No   Sexual activity: Not on file    Comment: married, 5 children, works at [employer], exrecise with walking  Other Topics Concern   Not on file  Social History Narrative   Single,  retired, exercises with walking, lives alone.  Daughter checks on her regularly.    10/2019   Social Determinants of Health   Financial Resource Strain: Low Risk  (03/24/2022)   Overall Financial Resource Strain (CARDIA)    Difficulty of Paying Living Expenses: Not hard at all  Food Insecurity: No Food Insecurity (03/24/2022)   Hunger Vital Sign    Worried About Running Out of Food in the Last Year: Never true    Ran Out of Food in the Last Year: Never true  Transportation Needs: No Transportation Needs (03/24/2022)   PRAPARE - Administrator, Civil Service (Medical): No    Lack of Transportation (Non-Medical): No  Physical Activity: Insufficiently Active  (03/24/2022)   Exercise Vital Sign    Days of Exercise per Week: 4 days    Minutes of Exercise per Session: 30 min  Stress: No Stress Concern Present (03/24/2022)   [employer] of Occupational Health - Occupational Stress Questionnaire    Feeling of Stress : Not at all  Social Connections: Not on file    Tobacco Counseling Counseling given: Not Answered   Clinical Intake:  Pre-visit preparation completed: Yes  Pain : No/denies pain  Nutritional Status: BMI 25 -29 Overweight Nutritional Risks: None Diabetes: Yes  How often do you need to have someone help you when you read instructions, pamphlets, or other written materials from your doctor or pharmacy?: 1 - Never  Diabetic? Yes Nutrition Risk Assessment:  Has the patient had any N/V/D within the last 2 months?  No  Does the patient have any non-healing wounds?  No  Has the patient had any unintentional weight loss or weight gain?  No   Diabetes:  Is the patient diabetic?  Yes  If diabetic, was a CBG obtained today?  No  Did the patient bring in their glucometer from home?  No  How often do you monitor your CBG's? Every other day.   Financial Strains and Diabetes Management:  Are you having any financial strains with the device, your supplies or your medication? No .  Does the patient want to be seen by Chronic Care Management for management of their diabetes?  No  Would the patient like to be referred to a Nutritionist or for Diabetic Management?  No   Diabetic Exams:  Diabetic Eye Exam: Overdue for diabetic eye exam. Pt has been advised about the importance in completing this exam. Patient advised to call and schedule an eye exam. Diabetic Foot Exam: Overdue, Pt has been advised about the importance in completing this exam. Pt is scheduled for diabetic foot exam on next appointment.   Interpreter Needed?: No  Information entered by :: NAllen LPN   Activities of Daily Living    03/24/2022    1:33 PM   In your present state of health, do you have any difficulty performing the following activities:  Hearing? 0  Vision? 0  Difficulty concentrating or making decisions? 0  Walking or climbing stairs? 0  Dressing or bathing? 0  Doing errands, shopping? 0  Preparing Food and eating ? N  Using the Toilet? N  In the past six months, have you accidently leaked urine? N  Do you have problems with loss of bowel control? N  Managing your Medications? N  Managing your Finances? N  Housekeeping or managing your Housekeeping? N    Patient Care Team: Tysinger, Camelia Eng, PA-C as PCP - General (Family Medicine)  Indicate any recent Medical Services you may have received from other than [facility] providers in the past year (date may be approximate).     Assessment:   This is a routine wellness examination for Loie.  Hearing/Vision screen Vision Screening - Comments:: Regular eye exams,   Dietary issues and exercise activities discussed: Current Exercise Habits: Home exercise routine, Type of exercise: walking, Time (Minutes): 30, Frequency (Times/Week): 4, Weekly Exercise (Minutes/Week): 120   Goals Addressed             This Visit's Progress    Patient Stated       03/24/2022, wants to keep losing weight       Depression Screen    03/24/2022    1:33 PM 06/25/2021    1:44 PM 03/31/2021    3:21 PM 12/26/2020   11:28 AM 09/02/2020    3:10 PM 11/02/2019   10:03 AM 05/28/2017   10:56 AM  PHQ 2/9 Scores  PHQ - 2 Score 0 0 0 0 0 0 0  PHQ- 9 Score 0          Fall Risk    03/24/2022    1:32 PM 06/25/2021    1:44 PM 03/31/2021    3:21 PM  12/26/2020   11:28 AM 09/02/2020    3:10 PM  Fall Risk   Falls in the past year? 0 0 0 0 0  Number falls in past yr: 0 0 0 0 0  Injury with Fall? 0 0 0 0 0  Risk for fall due to : Medication side effect No Fall Risks No Fall Risks No Fall Risks No Fall Risks  Follow up Falls prevention discussed;Education provided;Falls evaluation completed Falls evaluation  completed Falls evaluation completed Falls evaluation completed Falls evaluation completed    FALL RISK PREVENTION PERTAINING TO THE HOME:  Any stairs in or around the home? Yes  If so, are there any without handrails? No  Home free of loose throw rugs in walkways, pet beds, electrical cords, etc? Yes  Adequate lighting in your home to reduce risk of falls? Yes   ASSISTIVE DEVICES UTILIZED TO PREVENT FALLS:  Life alert? No  Use of a cane, walker or w/c? No  Grab bars in the bathroom? No  Shower chair or bench in shower? No  Elevated toilet seat or a handicapped toilet? No   TIMED UP AND GO:  Was the test performed? No .      Cognitive Function:        03/24/2022    1:35 PM  6CIT Screen  What Year? 0 points  What month? 0 points  What time? 0 points  Count back from 20 0 points  Months in reverse 0 points  Repeat phrase 2 points  Total Score 2 points    Immunizations Immunization History  Administered Date(s) Administered   COVID-19, mRNA, vaccine(Comirnaty)12 years and older 12/15/2021   Fluad Quad(high Dose 65+) 11/01/2018, 11/02/2019, 03/27/2020, 12/26/2020, 12/15/2021   Hepatitis B, adult 11/28/2012, 12/29/2012, 03/15/2014   Influenza, High Dose Seasonal PF 04/28/2017, 10/24/2017, 01/01/2018, 10/19/2018   Influenza,inj,Quad PF,6+ Mos 11/28/2012, 03/15/2014, 03/16/2016, 10/16/2016, 02/03/2017   PFIZER Comirnaty(Gray Top)Covid-19 Tri-Sucrose Vaccine 09/02/2020   PFIZER(Purple Top)SARS-COV-2 Vaccination 04/23/2019, 05/23/2019, 12/21/2019   PNEUMOCOCCAL CONJUGATE-20 04/02/2020   Pneumococcal Conjugate-13 10/24/2017, 06/08/2018, 10/20/2018   Pneumococcal Polysaccharide-23 11/28/2012   Tdap 11/27/2010, 06/25/2021   Zoster Recombinat (Shingrix) 06/08/2018, 10/20/2018, 11/01/2018   Zoster, Live 11/27/2010    TDAP status: Up to date  Flu Vaccine status: Up to date  Pneumococcal vaccine status: Up to date  Covid-19 vaccine status: Completed  vaccines  Qualifies for Shingles Vaccine? Yes   Zostavax completed Yes   Shingrix Completed?: Yes  Screening Tests Health Maintenance  Topic Date Due   Medicare Annual Wellness (AWV)  05/29/2018   COLONOSCOPY (Pts 45-24yrs Insurance coverage will need to be confirmed)  09/03/2020   OPHTHALMOLOGY EXAM  08/19/2021   Diabetic kidney evaluation - Urine ACR  12/26/2021   FOOT EXAM  12/26/2021   HEMOGLOBIN A1C  12/26/2021   Diabetic kidney evaluation - eGFR measurement  03/31/2022   MAMMOGRAM  07/02/2023   DTaP/Tdap/Td (3 - Td or Tdap) 06/26/2031   Pneumonia Vaccine 22+ Years old  Completed   INFLUENZA VACCINE  Completed   DEXA SCAN  Completed   COVID-19 Vaccine  Completed   Hepatitis C Screening  Completed   Zoster Vaccines- Shingrix  Completed   HPV VACCINES  Aged Out    Health Maintenance  Health Maintenance Due  Topic Date Due   Medicare Annual Wellness (AWV)  05/29/2018   COLONOSCOPY (Pts 45-29yrs Insurance coverage will need to be confirmed)  09/03/2020   OPHTHALMOLOGY EXAM  08/19/2021   Diabetic kidney evaluation - Urine ACR  12/26/2021   FOOT EXAM  12/26/2021   HEMOGLOBIN A1C  12/26/2021   Diabetic kidney evaluation - eGFR measurement  03/31/2022    Colorectal cancer screening: Type of screening: Colonoscopy. Completed 09/04/2010. Repeat every 10 years  Mammogram status: Completed 07/01/2021. Repeat every year  Bone Density status: Completed 01/09/2019.   Lung Cancer Screening: (Low Dose CT Chest recommended if Age 85-80 years, 30 pack-year currently smoking OR have quit w/in 15years.) does not qualify.   Lung Cancer Screening Referral: no  Additional Screening:  Hepatitis C Screening: does qualify; Completed 07/17/2015  Vision Screening: Recommended annual ophthalmology exams for early detection of glaucoma and other disorders of the eye. Is the patient up to date with their annual eye exam?  No  Who is the provider or what is the name of the office in which the  patient attends annual eye exams? Can't remember name If pt is not established with a provider, would they like to be referred to a provider to establish care? No .   Dental Screening: Recommended annual dental exams for proper oral hygiene  Community Resource Referral / Chronic Care Management: CRR required this visit?  No   CCM required this visit?  No      Plan:     I have personally reviewed and noted the following in the patient's chart:   Medical and social history Use of alcohol, tobacco or illicit drugs  Current medications and supplements including opioid prescriptions. Patient is not currently taking opioid prescriptions. Functional ability and status Nutritional status Physical activity Advanced directives List of other physicians Hospitalizations, surgeries, and ER visits in previous 12 months Vitals Screenings to include cognitive, depression, and falls Referrals and appointments  In addition, I have reviewed and discussed with patient certain preventive protocols, quality metrics, and best practice recommendations. A written personalized care plan for preventive services as well as general preventive health recommendations were provided to patient.     Kellie Simmering, LPN   [id number]/61/[id number]   Nurse Notes: none  Due to this being a virtual visit, the after visit summary with patients personalized plan was offered to patient via mail or my-chart.  to pick up at office at next visit

## 2022-04-06 ENCOUNTER — Encounter: Payer: Self-pay | Admitting: Medical

## 2022-04-06 ENCOUNTER — Ambulatory Visit (INDEPENDENT_AMBULATORY_CARE_PROVIDER_SITE_OTHER): Payer: 59 | Admitting: Medical

## 2022-04-06 VITALS — BP 110/64 | HR 68 | Ht 65.5 in | Wt 174.6 lb

## 2022-04-06 DIAGNOSIS — E118 Type 2 diabetes mellitus with unspecified complications: Secondary | ICD-10-CM | POA: Diagnosis not present

## 2022-04-06 DIAGNOSIS — I447 Left bundle-branch block, unspecified: Secondary | ICD-10-CM

## 2022-04-06 DIAGNOSIS — G8929 Other chronic pain: Secondary | ICD-10-CM

## 2022-04-06 DIAGNOSIS — Z780 Asymptomatic menopausal state: Secondary | ICD-10-CM

## 2022-04-06 DIAGNOSIS — E039 Hypothyroidism, unspecified: Secondary | ICD-10-CM | POA: Diagnosis not present

## 2022-04-06 DIAGNOSIS — K635 Polyp of colon: Secondary | ICD-10-CM | POA: Diagnosis not present

## 2022-04-06 DIAGNOSIS — M542 Cervicalgia: Secondary | ICD-10-CM | POA: Diagnosis not present

## 2022-04-06 DIAGNOSIS — I10 Essential (primary) hypertension: Secondary | ICD-10-CM | POA: Diagnosis not present

## 2022-04-06 DIAGNOSIS — E785 Hyperlipidemia, unspecified: Secondary | ICD-10-CM

## 2022-04-06 DIAGNOSIS — Z0000 Encounter for general adult medical examination without abnormal findings: Secondary | ICD-10-CM | POA: Diagnosis not present

## 2022-04-06 DIAGNOSIS — R4189 Other symptoms and signs involving cognitive functions and awareness: Secondary | ICD-10-CM

## 2022-04-06 DIAGNOSIS — M545 Low back pain, unspecified: Secondary | ICD-10-CM | POA: Diagnosis not present

## 2022-04-06 DIAGNOSIS — I8393 Asymptomatic varicose veins of bilateral lower extremities: Secondary | ICD-10-CM

## 2022-04-06 DIAGNOSIS — R011 Cardiac murmur, unspecified: Secondary | ICD-10-CM | POA: Diagnosis not present

## 2022-04-06 DIAGNOSIS — Z7189 Other specified counseling: Secondary | ICD-10-CM

## 2022-04-06 DIAGNOSIS — E1169 Type 2 diabetes mellitus with other specified complication: Secondary | ICD-10-CM | POA: Diagnosis not present

## 2022-04-06 DIAGNOSIS — Z972 Presence of dental prosthetic device (complete) (partial): Secondary | ICD-10-CM

## 2022-04-06 DIAGNOSIS — Z1231 Encounter for screening mammogram for malignant neoplasm of breast: Secondary | ICD-10-CM

## 2022-04-06 DIAGNOSIS — J301 Allergic rhinitis due to pollen: Secondary | ICD-10-CM

## 2022-04-06 DIAGNOSIS — Z7185 Encounter for immunization safety counseling: Secondary | ICD-10-CM | POA: Diagnosis not present

## 2022-04-06 DIAGNOSIS — I872 Venous insufficiency (chronic) (peripheral): Secondary | ICD-10-CM | POA: Diagnosis not present

## 2022-04-06 DIAGNOSIS — Z1211 Encounter for screening for malignant neoplasm of colon: Secondary | ICD-10-CM

## 2022-04-06 LAB — POCT URINALYSIS DIP (PROADVANTAGE DEVICE)
Bilirubin, UA: NEGATIVE
Blood, UA: NEGATIVE
Glucose, UA: NEGATIVE mg/dL
Ketones, POC UA: NEGATIVE mg/dL
Nitrite, UA: NEGATIVE
Protein Ur, POC: NEGATIVE mg/dL
Specific Gravity, Urine: 1.03
Urobilinogen, Ur: NEGATIVE
pH, UA: 6 (ref 5.0–8.0)

## 2022-04-06 NOTE — Patient Instructions (Addendum)
This visit was a preventative care visit, also known as wellness visit or routine physical.   Topics typically include healthy lifestyle, diet, exercise, preventative care, vaccinations, sick and well care, proper use of emergency dept and after hours care, as well as other concerns.     Recommendations: Continue to return yearly for your annual wellness and preventative care visits.  This gives [place] a chance to discuss healthy lifestyle, exercise, vaccinations, review your chart record, and perform screenings where appropriate.  I recommend you see your eye doctor yearly for routine vision care.  I recommend you see your dentist yearly for routine dental care including hygiene visits twice yearly.   Vaccination recommendations were reviewed Immunization History  Administered Date(s) Administered   COVID-19, mRNA, vaccine(Comirnaty)12 years and older 12/15/2021   Fluad Quad(high Dose 65+) 11/01/2018, 11/02/2019, 03/27/2020, 12/26/2020, 12/15/2021   Hepatitis B, adult 11/28/2012, 12/29/2012, 03/15/2014   Influenza, High Dose Seasonal PF 04/28/2017, 10/24/2017, 01/01/2018, 10/19/2018   Influenza,inj,Quad PF,6+ Mos 11/28/2012, 03/15/2014, 03/16/2016, 10/16/2016, 02/03/2017   PFIZER Comirnaty(Gray Top)Covid-19 Tri-Sucrose Vaccine 09/02/2020   PFIZER(Purple Top)SARS-COV-2 Vaccination 04/23/2019, 05/23/2019, 12/21/2019   PNEUMOCOCCAL CONJUGATE-20 04/02/2020   Pneumococcal Conjugate-13 10/24/2017, 06/08/2018, 10/20/2018   Pneumococcal Polysaccharide-23 11/28/2012   Tdap 11/27/2010, 06/25/2021   Zoster Recombinat (Shingrix) 06/08/2018, 10/20/2018, 11/01/2018   Zoster, Live 11/27/2010     Screening for cancer: Colon cancer screening: Referred back to GI last year but she didn't schedule.  We will try again.   We will refer you for back to for screening colonoscopy  Cervical cancer screening - up to date on pap 07/2021  Breast cancer screening: You should perform a self breast exam monthly.    We reviewed recommendations for regular mammograms and breast cancer screening.   Please call to schedule your mammogram 06/2022 and bone density test NOW  [facility]  [id number], [place], [place] [address]    Skin cancer screening: Check your skin regularly for new changes, growing lesions, or other lesions of concern Come in for evaluation if you have skin lesions of concern.  Lung cancer screening: If you have a greater than 20 pack year history of tobacco use, then you may qualify for lung cancer screening with a chest CT scan.   Please call your insurance company to inquire about coverage for this test.  We currently don't have screenings for other cancers besides breast, cervical, colon, and lung cancers.  If you have a strong family history of cancer or have other cancer screening concerns, please let me know.    Bone health: Get at least 150 minutes of aerobic exercise weekly Get weight bearing exercise at least once weekly Bone density test:  A bone density test is an imaging test that uses a type of X-ray to measure the amount of calcium and other minerals in your bones. The test may be used to diagnose or screen you for a condition that causes weak or thin bones (osteoporosis), predict your risk for a broken bone (fracture), or determine how well your osteoporosis treatment is working. The bone density test is recommended for females 21 and older, or females or males [id number] if certain risk factors such as thyroid disease, long term use of steroids such as for asthma or rheumatological issues, vitamin D deficiency, estrogen deficiency, family history of osteoporosis, self or family history of fragility fracture in first degree relative.   Call and schedule bone density test now    Heart health:  Get at least 150 minutes of aerobic exercise weekly Limit alcohol It is important to maintain a healthy blood pressure and  healthy cholesterol numbers  Heart disease screening: Screening for heart disease includes screening for blood pressure, fasting lipids, glucose/diabetes screening, BMI height to weight ratio, reviewed of smoking status, physical activity, and diet.    Goals include blood pressure 120/80 or less, maintaining a healthy lipid/cholesterol profile, preventing diabetes or keeping diabetes numbers under good control, not smoking or using tobacco products, exercising most days per week or at least 150 minutes per week of exercise, and eating healthy variety of fruits and vegetables, healthy oils, and avoiding unhealthy food choices like fried food, fast food, high sugar and high cholesterol foods.       Medical care options: I recommend you continue to seek care here first for routine care.  We try really hard to have available appointments Monday through Friday daytime hours for sick visits, acute visits, and physicals.  Urgent care should be used for after hours and weekends for significant issues that cannot wait till the next day.  The emergency department should be used for significant potentially life-threatening emergencies.  The emergency department is expensive, can often have long wait times for less significant concerns, so try to utilize primary care, urgent care, or telemedicine when possible to avoid unnecessary trips to the emergency department.  Virtual visits and telemedicine have been introduced since the pandemic started in 2020, and can be convenient ways to receive medical care.  We offer virtual appointments as well to assist you in a variety of options to seek medical care.   Advanced Directives: I recommend you consider completing a Pen Mar and Living Will.   These documents respect your wishes and help alleviate burdens on your loved ones if you were to become terminally ill or be in a position to need those documents enforced.    You can complete Advanced  Directives yourself, have them notarized, then have copies made for our office, for you and for anybody you feel should have them in safe keeping.  Or, you can have an attorney prepare these documents.   If you haven't updated your Last Will and Testament in a while, it may be worthwhile having an attorney prepare these documents together and save on some costs.       Referrals today: Referral back to gastroenterology for updated colonoscopy  Referral back to cardiology for periodica checkup  Referral to eye doctor for routine eye care and diabetic eye exam    Specific Concerns: Diabetes-continue Mounjaro. Lab today.  Checking sugar some.    Hyperlipidemia-continue statin Atorvastatin Lipitor 40 mg daily  Hypertension-continue current medications, Lisinopril HCT 20/12.5 mg daily   Hypothyroidism-discussed proper use of medication, continue current medication Levothyroxine 88 mcg daily, labs today  Varicose veins-follow-up with vascular specialist if pain or concerns  Vitamin D deficiency-continue supplement vitamin D, labs today  Allergic rhinitis/seasonal allergies  Wears dentures - follow up with dentures given oral irritation  Heart murmur - reviewed 2021 echocardiogram, referral back to cardiology for check in   Back and neck pain   Please go to [facility] for your xray.   Their hours are 8am - 4:30 pm Monday - Friday.  Take your insurance card with you.  [facility] [phone]  [address], [place], [place] [address]  [address] [place], [place] [address]

## 2022-04-06 NOTE — Progress Notes (Unsigned)
Subjective:    Kathryn [name] is a 72 y.o. female who presents for physical/well visit.  Chief Complaint  Patient presents with   nonfasting cpe    Nonfasting cpe, had cornbread and greens around 8am, back pain    Primary Care Provider Holdyn Poyser, Camelia Eng, PA-C here for primary care  Current Health Care Team: Dr. Juanita Craver, GI Dr. Glenetta Hew, cardiology Dr. Linus Mako, vascular Dr. Warden Fillers, ophthalmology   Concerns: Lately having some back and neck pains.  Has chronic back pain and weather changes seems to aggravate the pains.   Exercise Current exercise habits: walking  Nutrition/Diet Current diet: in general, a "healthy" diet    Depression Screen    03/24/2022    1:33 PM  Depression screen PHQ 2/9  Decreased Interest 0  Down, Depressed, Hopeless 0  PHQ - 2 Score 0  Altered sleeping 0  Tired, decreased energy 0  Change in appetite 0  Feeling bad or failure about yourself  0  Trouble concentrating 0  Moving slowly or fidgety/restless 0  Suicidal thoughts 0  PHQ-9 Score 0  Difficult doing work/chores Not difficult at all     Fall Risk Screen    03/24/2022    1:32 PM 06/25/2021    1:44 PM 03/31/2021    3:21 PM 12/26/2020   11:28 AM 09/02/2020    3:10 PM  Fall Risk   Falls in the past year? 0 0 0 0 0  Number falls in past yr: 0 0 0 0 0  Injury with Fall? 0 0 0 0 0  Risk for fall due to : Medication side effect No Fall Risks No Fall Risks No Fall Risks No Fall Risks  Follow up Falls prevention discussed;Education provided;Falls evaluation completed Falls evaluation completed Falls evaluation completed Falls evaluation completed Falls evaluation completed    Gait Assessment: Normal gait observed yes  Advanced directives yes    Past Medical History:  Diagnosis Date   Anemia    Diabetes mellitus    Diverticula of colon    per colonoscopy6/20/11   Edema of lower extremity    wears ted hose   H/O echocardiogram 2014   EF 65%,  slight gradient of aortic valve, no major findings otherwise   Hx of cardiovascular stress test    Adenosine Myoview 04/2008:  Normal.   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 Juanita Craver; polyp, tubular adenoma; repeat 3 years   LASIK     TRANSTHORACIC ECHOCARDIOGRAM  11/2012; 06/2016   a. EF~65%. Mild AS. Mean gradient 13 mmHg. Peak 20 mmHg;; b. Normal LV size and function.  EF 60-65%.  Possible mild LVOT gradient.   VEIN SURGERY  2010   right leg    Social History   Socioeconomic History   Marital status: Divorced    Spouse name: Not on file   Number of children: Not on file   Years of education: Not on file   Highest  education level: Not on file  Occupational History   Not on file  Tobacco Use   Smoking status: Never   Smokeless tobacco: Never  Vaping Use   Vaping Use: Never used  Substance and Sexual Activity   Alcohol use: No   Drug use: No   Sexual activity: Not on file    Comment: married, 5 children, works at [employer], exrecise with walking  Other Topics Concern   Not on file  Social History Narrative   Single,  retired, exercises with walking, lives alone.  Daughter checks on her regularly.    10/2019   Social Determinants of Health   Financial Resource Strain: Low Risk  (03/24/2022)   Overall Financial Resource Strain (CARDIA)    Difficulty of Paying Living Expenses: Not hard at all  Food Insecurity: No Food Insecurity (03/24/2022)   Hunger Vital Sign    Worried About Running Out of Food in the Last Year: Never true    Ran Out of  Food in the Last Year: Never true  Transportation Needs: No Transportation Needs (03/24/2022)   PRAPARE - Hydrologist (Medical): No    Lack of Transportation (Non-Medical): No  Physical Activity: Insufficiently Active (03/24/2022)   Exercise Vital Sign    Days of Exercise per Week: 4 days    Minutes of Exercise per Session: 30 min  Stress: No Stress Concern Present (03/24/2022)   [place]    Feeling of Stress : Not at all  Social Connections: Not on file  Intimate Partner Violence: Not on file    Family History  Problem Relation Age of Onset   CAD Mother        s/p PCI   Cancer Father        stomach cancer   Diabetes Sister    CAD Sister        s/p PCI   Breast cancer Sister        54s   CAD Brother        s/p PCI     Current Outpatient Medications:    atorvastatin (LIPITOR) 40 MG tablet, Take 1 tablet (40 mg total) by mouth daily., Disp: 90 tablet, Rfl: 3   cetirizine (ZYRTEC) 10 MG tablet, Take 1 tablet (10 mg total) by mouth at bedtime., Disp: 30 tablet, Rfl: 11   cholecalciferol (VITAMIN D3) 25 MCG (1000 UNIT) tablet, Take 1 tablet (1,000 Units total) by mouth daily., Disp: 90 tablet, Rfl: 3   fluticasone (FLONASE) 50 MCG/ACT nasal spray, Place 2 sprays into both nostrils daily., Disp: 16 g, Rfl: 11   levothyroxine (SYNTHROID) 88 MCG tablet, Take 1 tablet (88 mcg total) by mouth daily., Disp: 90 tablet, Rfl: 0   lisinopril-hydrochlorothiazide (ZESTORETIC) 20-12.5 MG tablet, TAKE 1 TABLET BY MOUTH DAILY, Disp: 90 tablet, Rfl: 0   MOUNJARO 12.5 MG/0.5ML Pen, INJECT 12.5 MG INTO THE SKIN  ONCE A WEEK. BEGIN AFTER A MONTH OF THE 10MG DOSE, Disp: 6 mL, Rfl: 0   Insulin Pen Needle (B-D UF III MINI PEN NEEDLES) 31G X 5 MM MISC, 1 EACH BY DOES NOT APPLY ROUTE AS DIRECTED., Disp: 100 each, Rfl: 11   lidocaine (XYLOCAINE) 2 % solution, Use as directed 5-15 mLs in the mouth or throat every  4 (four) hours as needed for mouth pain. (Patient not taking: Reported on 03/24/2022), Disp: 60 mL, Rfl: 0  Allergies  Allergen Reactions   Codeine  History reviewed: allergies, current medications, past family history, past medical history, past social history, past surgical history and problem list  Chronic issues discussed: High blood pressure-compliant with medication.  Does not check blood pressures at home  Diabetes-compliant with mounjaro weekly.  She checks blood sugars some.  She denies any polyuria or polydipsia.    High cholesterol-compliant with medication without complaint  She is compliant with vitamin D supplement  Compliant with thyroid medication  She lives alone.  Her daughter [place] Parker who lives in [place] checks on her periodically throughout the week    Objective:     BP 110/64   Pulse 68   Ht 5' 5.5" (1.664 m)   Wt 174 lb 9.6 oz (79.2 kg)   LMP  (LMP Unknown)   BMI 28.61 kg/m   BP Readings from Last 3 Encounters:  04/06/22 110/64  12/15/21 108/64  09/05/21 110/70   Wt Readings from Last 3 Encounters:  04/06/22 174 lb 9.6 oz (79.2 kg)  03/24/22 174 lb (78.9 kg)  12/15/21 196 lb (88.9 kg)    Biometrics BP 110/64   Pulse 68   Ht 5' 5.5" (1.664 m)   Wt 174 lb 9.6 oz (79.2 kg)   LMP  (LMP Unknown)   BMI 28.61 kg/m   Gen: wd, wn, nad, [place] American female Skin: no acute worrisome changes, skin tags of right temple unchanged HEENT: normocephalic, sclerae anicteric, TMs pearly, nares patent, no discharge or erythema, pharynx normal Oral cavity: MMM, no lesions, upper and lower dentures in place Neck: supple, limited neck extension, but rest of ROM ok, nontender, no lymphadenopathy, no thyromegaly, no masses, no bruits Heart: RRR, normal S1, S2, 2/6 brief upper sternal border murmur Lungs: CTA bilaterally, no wheezes, rhonchi, or rales Abdomen: +bs, soft, non tender, non distended, no masses, no hepatomegaly, no splenomegaly Back:  mild lumbar tenderness, relatively normal ROM Musculoskeletal: nontender, no swelling, no obvious deformity Extremities: no edema, no cyanosis, no clubbing Pulses: 2+ symmetric, upper and lower extremities, normal cap refill Neurological: alert, oriented x 3, CN2-12 intact, strength normal upper extremities and lower extremities, sensation normal throughout, DTRs 2+ throughout, no cerebellar signs, gait normal Psychiatric: normal affect, behavior normal, pleasant  Breast/gyn - deferred to later date  Diabetic Foot Exam - Simple   Simple Foot Form Diabetic Foot exam was performed with the following findings: Yes 04/06/2022  2:02 PM  Visual Inspection No deformities, no ulcerations, no other skin breakdown bilaterally: Yes Sensation Testing Intact to touch and monofilament testing bilaterally: Yes Pulse Check Posterior Tibialis and Dorsalis pulse intact bilaterally: Yes Comments      Assessment:   Encounter Diagnoses  Name Primary?   Encounter for health maintenance examination in adult Yes   Essential hypertension    LBBB (left bundle branch block)    Varicose veins of both lower extremities, unspecified whether complicated    Chronic venous insufficiency    Allergic rhinitis due to pollen, unspecified seasonality    Polyp of colon, unspecified part of colon, unspecified type    Diabetes mellitus type 2 with complications ([place])    Hyperlipidemia associated with type 2 diabetes mellitus ([place])    Hypothyroidism, unspecified type    Advanced directives, counseling/discussion    Wears dentures    Vaccine counseling    Screening mammogram for breast cancer    Screen for colon cancer    Post-menopausal    Impaired cognitive perception pattern    Heart murmur    Neck pain  Chronic bilateral low back pain without sciatica      Plan:   This visit was a preventative care visit, also known as wellness visit or routine physical.   Topics typically include healthy lifestyle,  diet, exercise, preventative care, vaccinations, sick and well care, proper use of emergency dept and after hours care, as well as other concerns.     Recommendations: Continue to return yearly for your annual wellness and preventative care visits.  This gives [place] a chance to discuss healthy lifestyle, exercise, vaccinations, review your chart record, and perform screenings where appropriate.  I recommend you see your eye doctor yearly for routine vision care.  I recommend you see your dentist yearly for routine dental care including hygiene visits twice yearly.   Vaccination recommendations were reviewed Immunization History  Administered Date(s) Administered   COVID-19, mRNA, vaccine(Comirnaty)12 years and older 12/15/2021   Fluad Quad(high Dose 65+) 11/01/2018, 11/02/2019, 03/27/2020, 12/26/2020, 12/15/2021   Hepatitis B, adult 11/28/2012, 12/29/2012, 03/15/2014   Influenza, High Dose Seasonal PF 04/28/2017, 10/24/2017, 01/01/2018, 10/19/2018   Influenza,inj,Quad PF,6+ Mos 11/28/2012, 03/15/2014, 03/16/2016, 10/16/2016, 02/03/2017   PFIZER Comirnaty(Gray Top)Covid-19 Tri-Sucrose Vaccine 09/02/2020   PFIZER(Purple Top)SARS-COV-2 Vaccination 04/23/2019, 05/23/2019, 12/21/2019   PNEUMOCOCCAL CONJUGATE-20 04/02/2020   Pneumococcal Conjugate-13 10/24/2017, 06/08/2018, 10/20/2018   Pneumococcal Polysaccharide-23 11/28/2012   Tdap 11/27/2010, 06/25/2021   Zoster Recombinat (Shingrix) 06/08/2018, 10/20/2018, 11/01/2018   Zoster, Live 11/27/2010     Screening for cancer: Colon cancer screening: Referred back to GI last year but she didn't schedule.  We will try again.   We will refer you for back to for screening colonoscopy  Cervical cancer screening - up to date on pap 07/2021  Breast cancer screening: You should perform a self breast exam monthly.   We reviewed recommendations for regular mammograms and breast cancer screening.   Please call to schedule your mammogram 06/2022 and  bone density test NOW  [facility]  [id number] [place], [place] [address]    Skin cancer screening: Check your skin regularly for new changes, growing lesions, or other lesions of concern Come in for evaluation if you have skin lesions of concern.  Lung cancer screening: If you have a greater than 20 pack year history of tobacco use, then you may qualify for lung cancer screening with a chest CT scan.   Please call your insurance company to inquire about coverage for this test.  We currently don't have screenings for other cancers besides breast, cervical, colon, and lung cancers.  If you have a strong family history of cancer or have other cancer screening concerns, please let me know.    Bone health: Get at least 150 minutes of aerobic exercise weekly Get weight bearing exercise at least once weekly Bone density test:  A bone density test is an imaging test that uses a type of X-ray to measure the amount of calcium and other minerals in your bones. The test may be used to diagnose or screen you for a condition that causes weak or thin bones (osteoporosis), predict your risk for a broken bone (fracture), or determine how well your osteoporosis treatment is working. The bone density test is recommended for females 40 and older, or females or males [id number] if certain risk factors such as thyroid disease, long term use of steroids such as for asthma or rheumatological issues, vitamin D deficiency, estrogen deficiency, family history of osteoporosis, self or family history of fragility fracture in first  degree relative.   Call and schedule bone density test now    Heart health: Get at least 150 minutes of aerobic exercise weekly Limit alcohol It is important to maintain a healthy blood pressure and healthy cholesterol numbers  Heart disease screening: Screening for heart disease includes screening for blood pressure, fasting  lipids, glucose/diabetes screening, BMI height to weight ratio, reviewed of smoking status, physical activity, and diet.    Goals include blood pressure 120/80 or less, maintaining a healthy lipid/cholesterol profile, preventing diabetes or keeping diabetes numbers under good control, not smoking or using tobacco products, exercising most days per week or at least 150 minutes per week of exercise, and eating healthy variety of fruits and vegetables, healthy oils, and avoiding unhealthy food choices like fried food, fast food, high sugar and high cholesterol foods.       Medical care options: I recommend you continue to seek care here first for routine care.  We try really hard to have available appointments Monday through Friday daytime hours for sick visits, acute visits, and physicals.  Urgent care should be used for after hours and weekends for significant issues that cannot wait till the next day.  The emergency department should be used for significant potentially life-threatening emergencies.  The emergency department is expensive, can often have long wait times for less significant concerns, so try to utilize primary care, urgent care, or telemedicine when possible to avoid unnecessary trips to the emergency department.  Virtual visits and telemedicine have been introduced since the pandemic started in 2020, and can be convenient ways to receive medical care.  We offer virtual appointments as well to assist you in a variety of options to seek medical care.   Advanced Directives: I recommend you consider completing a [place] and Living Will.   These documents respect your wishes and help alleviate burdens on your loved ones if you were to become terminally ill or be in a position to need those documents enforced.    You can complete Advanced Directives yourself, have them notarized, then have copies made for our office, for you and for anybody you feel should have them in  safe keeping.  Or, you can have an attorney prepare these documents.   If you haven't updated your Last Will and Testament in a while, it may be worthwhile having an attorney prepare these documents together and save on some costs.       Referrals today: Referral back to gastroenterology for updated colonoscopy  Referral back to cardiology for periodica checkup  Referral to eye doctor for routine eye care and diabetic eye exam    Specific Concerns: Diabetes-continue Mounjaro. Lab today.  Checking sugar some.    Hyperlipidemia-continue statin Atorvastatin Lipitor 40 mg daily  Hypertension-continue current medications, Lisinopril HCT 20/12.5 mg daily   Hypothyroidism-discussed proper use of medication, continue current medication Levothyroxine 88 mcg daily, labs today  Varicose veins-follow-up with vascular specialist if pain or concerns  Vitamin D deficiency-continue supplement vitamin D, labs today  Allergic rhinitis/seasonal allergies  Wears dentures - follow up with dentures given oral irritation  Heart murmur - reviewed 2021 echocardiogram, referral back to cardiology for check in   Back and neck pain   Please go to [facility] for your xray.   Their hours are 8am - 4:30 pm Monday - Friday.  Take your insurance card with you.  [facility] [phone]  [place] [address], [address] [place], [place] [address]  [address], [place] [address]    Bronson Curb was seen today for nonfasting cpe.  Diagnoses and all orders for this visit:  Encounter for health maintenance examination in adult -     Ambulatory referral to Gastroenterology -     Hemoglobin A1c -     CBC -     Comprehensive metabolic panel -     Lipid panel -     Microalbumin/Creatinine Ratio, Urine -     POCT Urinalysis DIP (Proadvantage Device) -     TSH  Essential hypertension -     Ambulatory referral to Cardiology  LBBB (left bundle branch block) -     Ambulatory referral  to Cardiology  Varicose veins of both lower extremities, unspecified whether complicated  Chronic venous insufficiency -     Ambulatory referral to Cardiology  Allergic rhinitis due to pollen, unspecified seasonality  Polyp of colon, unspecified part of colon, unspecified type -     Ambulatory referral to Gastroenterology  Diabetes mellitus type 2 with complications ([place]) -     Ambulatory referral to Ophthalmology -     Hemoglobin A1c -     Microalbumin/Creatinine Ratio, Urine  Hyperlipidemia associated with type 2 diabetes mellitus ([place]) -     Lipid panel  Hypothyroidism, unspecified type -     TSH  Advanced directives, counseling/discussion  Wears dentures  Vaccine counseling  Screening mammogram for breast cancer  Screen for colon cancer -     Ambulatory referral to Gastroenterology  Post-menopausal  Impaired cognitive perception pattern  Heart murmur  Neck pain -     DG Cervical Spine Complete; Future -     DG Lumbar Spine Complete; Future  Chronic bilateral low back pain without sciatica -     DG Lumbar Spine Complete; Future   F/u pending labs

## 2022-04-08 ENCOUNTER — Other Ambulatory Visit: Payer: Self-pay | Admitting: Medical

## 2022-04-08 LAB — CBC
Hematocrit: 34.8 % (ref 34.0–46.6)
Hemoglobin: 11 g/dL — ABNORMAL LOW (ref 11.1–15.9)
MCH: 26.9 pg (ref 26.6–33.0)
MCHC: 31.6 g/dL (ref 31.5–35.7)
MCV: 85 fL (ref 79–97)
Platelets: 234 10*3/uL (ref 150–450)
RBC: 4.09 x10E6/uL (ref 3.77–5.28)
RDW: 12.8 % (ref 11.7–15.4)
WBC: 3.8 10*3/uL (ref 3.4–10.8)

## 2022-04-08 LAB — COMPREHENSIVE METABOLIC PANEL
ALT: 14 IU/L (ref 0–32)
AST: 15 IU/L (ref 0–40)
Albumin/Globulin Ratio: 1.2 (ref 1.2–2.2)
Albumin: 3.8 g/dL — ABNORMAL LOW (ref 3.9–4.9)
Alkaline Phosphatase: 94 IU/L (ref 44–121)
BUN/Creatinine Ratio: 16 (ref 12–28)
BUN: 16 mg/dL (ref 8–27)
Bilirubin Total: 0.4 mg/dL (ref 0.0–1.2)
CO2: 21 mmol/L (ref 20–29)
Calcium: 10.5 mg/dL — ABNORMAL HIGH (ref 8.7–10.3)
Chloride: 105 mmol/L (ref 96–106)
Creatinine, Ser: 1.03 mg/dL — ABNORMAL HIGH (ref 0.57–1.00)
Globulin, Total: 3.1 g/dL (ref 1.5–4.5)
Glucose: 84 mg/dL (ref 70–99)
Potassium: 4.4 mmol/L (ref 3.5–5.2)
Sodium: 141 mmol/L (ref 134–144)
Total Protein: 6.9 g/dL (ref 6.0–8.5)
eGFR: 58 mL/min/{1.73_m2} — ABNORMAL LOW (ref >59)

## 2022-04-08 LAB — MICROALBUMIN / CREATININE URINE RATIO
Creatinine, Urine: 222.1 mg/dL
Microalb/Creat Ratio: 2 mg/g creat (ref 0–29)
Microalbumin, Urine: 3.6 ug/mL

## 2022-04-08 LAB — TSH: TSH: 0.009 u[IU]/mL — ABNORMAL LOW (ref 0.450–4.500)

## 2022-04-08 LAB — LIPID PANEL
Chol/HDL Ratio: 2.4 ratio (ref 0.0–4.4)
Cholesterol, Total: 115 mg/dL (ref 100–199)
HDL: 47 mg/dL (ref >39)
LDL Chol Calc (NIH): 53 mg/dL (ref 0–99)
Triglycerides: 76 mg/dL (ref 0–149)
VLDL Cholesterol Cal: 15 mg/dL (ref 5–40)

## 2022-04-08 LAB — HEMOGLOBIN A1C
Est. average glucose Bld gHb Est-mCnc: 108 mg/dL
Hgb A1c MFr Bld: 5.4 % (ref 4.8–5.6)

## 2022-04-08 MED ORDER — LEVOTHYROXINE SODIUM 50 MCG PO TABS: 50.0000 ug | ORAL_TABLET | Freq: Every day | ORAL | 1 refills | Status: DC

## 2022-04-08 MED ORDER — LISINOPRIL-HYDROCHLOROTHIAZIDE 20-12.5 MG PO TABS: 1.0000 | ORAL_TABLET | Freq: Every day | ORAL | 2 refills | Status: DC

## 2022-04-08 MED ORDER — MOUNJARO 12.5 MG/0.5ML ~~LOC~~ SOAJ: SUBCUTANEOUS | 2 refills | Status: DC

## 2022-04-08 NOTE — Progress Notes (Signed)
Microalbumin kidney marker normal

## 2022-04-08 NOTE — Progress Notes (Signed)
Labs show you are slightly anemic, calcium a little elevated, labs show some chronic kidney disease but stable, blood sugar marker stable at 5.4%, cholesterol looks good.  Still pending microalbumin kidney marker.  Thyroid is way off showing that your dose is too high right now.  I am going to send a new lower dose of thyroid medicine to the pharmacy.  Make sure you take your thyroid medicine 30 to 45 minutes at least before breakfast on empty stomach and before your other medicines as well  Lets recheck in 6 weeks or so on the thyroid.  I will recheck your calcium and parathyroid hormone at that time as well

## 2022-04-19 ENCOUNTER — Other Ambulatory Visit: Payer: Self-pay | Admitting: Medical

## 2022-04-21 DIAGNOSIS — R600 Localized edema: Secondary | ICD-10-CM | POA: Diagnosis not present

## 2022-04-21 DIAGNOSIS — I872 Venous insufficiency (chronic) (peripheral): Secondary | ICD-10-CM | POA: Diagnosis not present

## 2022-04-21 DIAGNOSIS — I87393 Chronic venous hypertension (idiopathic) with other complications of bilateral lower extremity: Secondary | ICD-10-CM | POA: Diagnosis not present

## 2022-05-01 ENCOUNTER — Other Ambulatory Visit: Payer: Self-pay | Admitting: Medical

## 2022-05-01 NOTE — Telephone Encounter (Signed)
Pt has upcoming appt on 3/21. I gave #30 days with 1 refill back In February. Pt will have enough to get to appt

## 2022-05-02 ENCOUNTER — Other Ambulatory Visit: Payer: Self-pay | Admitting: Medical

## 2022-05-14 ENCOUNTER — Ambulatory Visit (INDEPENDENT_AMBULATORY_CARE_PROVIDER_SITE_OTHER): Payer: 59 | Admitting: Medical

## 2022-05-14 VITALS — BP 120/68 | HR 65 | Wt 179.8 lb

## 2022-05-14 DIAGNOSIS — I10 Essential (primary) hypertension: Secondary | ICD-10-CM

## 2022-05-14 DIAGNOSIS — Z1211 Encounter for screening for malignant neoplasm of colon: Secondary | ICD-10-CM | POA: Diagnosis not present

## 2022-05-14 DIAGNOSIS — E039 Hypothyroidism, unspecified: Secondary | ICD-10-CM | POA: Diagnosis not present

## 2022-05-14 DIAGNOSIS — E118 Type 2 diabetes mellitus with unspecified complications: Secondary | ICD-10-CM

## 2022-05-14 MED ORDER — VITAMIN D 25 MCG (1000 UNIT) PO TABS: 1000.0000 [IU] | ORAL_TABLET | Freq: Every day | ORAL | 3 refills | Status: AC

## 2022-05-14 MED ORDER — MOUNJARO 12.5 MG/0.5ML ~~LOC~~ SOAJ: SUBCUTANEOUS | 2 refills | Status: DC

## 2022-05-14 NOTE — Progress Notes (Signed)
Subjective:  Kathryn [name] is a 72 y.o. female who presents for Chief Complaint  Patient presents with   follow-up on labs    Follow-up on labs, no other concerns      Here for med check  I saw her recently for physical.  So her labs were abnormal.  She is here to follow-up on those labs  Recent thyroid labs not at goal.  We adjusted the dose.  she is compliant with levothyroxine 50 mcg daily  Her recent calcium was elevated.  Here to recheck on this  Her pharmacy was out of [facility] recently.  Says she has been out of this for a few weeks  No other aggravating or relieving factors.    No other c/o.  Past Medical History:  Diagnosis Date   Anemia    Diabetes mellitus    Diverticula of colon    per colonoscopy6/20/11   Edema of lower extremity    wears ted hose   H/O echocardiogram 2014   EF 65%, slight gradient of aortic valve, no major findings otherwise   Hx of cardiovascular stress test    Adenosine Myoview 04/2008:  Normal.   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 90 tablet 3   levothyroxine (SYNTHROID) 50 MCG tablet Take 1 tablet (50 mcg total) by mouth daily. 30 tablet 1   lisinopril-hydrochlorothiazide (ZESTORETIC) 20-12.5 MG tablet Take 1 tablet by mouth daily. 90 tablet 2   Insulin Pen Needle (B-D UF III MINI PEN NEEDLES) 31G X 5 MM MISC 1 EACH BY DOES NOT APPLY ROUTE AS DIRECTED. 100 each 11   lidocaine (XYLOCAINE) 2 % solution Use as directed 5-15 mLs in the mouth or throat every 4 (four) hours as needed for mouth pain. (Patient not taking: Reported on 03/24/2022) 60 mL 0   No  current facility-administered medications on file prior to visit.     The following portions of the patient's history were reviewed and updated as appropriate: allergies, current medications, past family history, past medical history, past social history, past surgical history and problem list.  ROS Otherwise as in subjective above    Objective: BP 120/68   Pulse 65   Wt 179 lb 12.8 oz (81.6 kg)   LMP  (LMP Unknown)   BMI 29.47 kg/m   Wt Readings from Last 3 Encounters:  05/14/22 179 lb 12.8 oz (81.6 kg)  04/06/22 174 lb 9.6 oz (79.2 kg)  03/24/22 174 lb (78.9 kg)    General appearance: alert, no distress, well developed, well nourished    Assessment: Encounter Diagnoses  Name Primary?   Essential hypertension Yes   Diabetes mellitus type 2 with complications ([place])    Hypothyroidism, unspecified type    Screen for colon cancer    Serum calcium elevated      Plan: Hypertension-continue current medication  Diabetes-continue Mounjaro.  If medicine is not available pharmacy please call us back  Hypothyroidism-adjusted labs last visit in February.  Updated labs today.  For now continue levothyroxine 50 mcg daily  Referral for  Cologuard  Elevated calcium recently on labs, recheck PTH and calcium today  [name] was seen today for follow-up on labs.  Diagnoses and all orders for this visit:  Essential hypertension  Diabetes mellitus type 2 with complications ([place])  Hypothyroidism, unspecified type -     TSH + free T4  Screen for colon cancer -     Cologuard  Serum calcium elevated -     PTH, Intact and Calcium  Other orders -     cholecalciferol (VITAMIN D3) 25 MCG (1000 UNIT) tablet; Take 1 tablet (1,000 Units total) by mouth daily. -     tirzepatide (MOUNJARO) 12.5 MG/0.5ML Pen; INJECT 12.5 MG INTO THE SKIN  ONCE A WEEK. BEGIN AFTER A MONTH OF THE 10MG  DOSE    Follow up: Pending labs

## 2022-05-15 LAB — TSH+FREE T4
Free T4: 1.71 ng/dL (ref 0.82–1.77)
TSH: 0.013 u[IU]/mL — ABNORMAL LOW (ref 0.450–4.500)

## 2022-05-15 LAB — PTH, INTACT AND CALCIUM
Calcium: 10.5 mg/dL — ABNORMAL HIGH (ref 8.7–10.3)
PTH: 39 pg/mL (ref 15–65)

## 2022-05-19 DIAGNOSIS — Z1211 Encounter for screening for malignant neoplasm of colon: Secondary | ICD-10-CM | POA: Diagnosis not present

## 2022-05-26 ENCOUNTER — Other Ambulatory Visit: Payer: Self-pay | Admitting: Medical

## 2022-05-26 DIAGNOSIS — E039 Hypothyroidism, unspecified: Secondary | ICD-10-CM

## 2022-05-28 LAB — COLOGUARD: COLOGUARD: NEGATIVE

## 2022-05-28 NOTE — Progress Notes (Signed)
Your Cologuard screening for colon cancer was negative.  This indicates a lower likelihood that colorectal cancer is present.   Lets plan to repeat this in 3 years.  However, if you develop bowel changes, blood in stool, unexpected weight loss, or other new bowel changes, then recheck.

## 2022-05-29 ENCOUNTER — Other Ambulatory Visit: Payer: Self-pay | Admitting: Medical

## 2022-06-23 ENCOUNTER — Other Ambulatory Visit: Payer: 59

## 2022-06-23 DIAGNOSIS — E039 Hypothyroidism, unspecified: Secondary | ICD-10-CM

## 2022-06-24 ENCOUNTER — Other Ambulatory Visit: Payer: Self-pay | Admitting: Medical

## 2022-06-24 DIAGNOSIS — E039 Hypothyroidism, unspecified: Secondary | ICD-10-CM

## 2022-06-24 LAB — TSH+FREE T4
Free T4: 1.11 ng/dL (ref 0.82–1.77)
TSH: 0.083 u[IU]/mL — ABNORMAL LOW (ref 0.450–4.500)

## 2022-06-24 MED ORDER — LEVOTHYROXINE SODIUM 25 MCG PO TABS: 25.0000 ug | ORAL_TABLET | Freq: Every day | ORAL | 1 refills | Status: DC

## 2022-06-24 NOTE — Progress Notes (Signed)
Change to levothyroxine, recheck lab in 4-6 weeks.

## 2022-07-11 ENCOUNTER — Other Ambulatory Visit: Payer: Self-pay | Admitting: Medical

## 2022-07-17 ENCOUNTER — Other Ambulatory Visit: Payer: Self-pay | Admitting: Medical

## 2022-07-29 ENCOUNTER — Other Ambulatory Visit: Payer: 59

## 2022-07-29 DIAGNOSIS — E039 Hypothyroidism, unspecified: Secondary | ICD-10-CM | POA: Diagnosis not present

## 2022-07-30 LAB — TSH+FREE T4
Free T4: 0.95 ng/dL (ref 0.82–1.77)
TSH: 3.4 u[IU]/mL (ref 0.450–4.500)

## 2022-07-31 ENCOUNTER — Other Ambulatory Visit: Payer: Self-pay | Admitting: Medical

## 2022-07-31 MED ORDER — LEVOTHYROXINE SODIUM 25 MCG PO TABS: 25.0000 ug | ORAL_TABLET | Freq: Every day | ORAL | 1 refills | Status: DC

## 2022-07-31 NOTE — Progress Notes (Signed)
Continue current dose of thyroid medicaiton given current lab findings

## 2022-08-03 ENCOUNTER — Encounter: Payer: Self-pay | Admitting: Internal Medicine

## 2022-09-30 ENCOUNTER — Telehealth: Payer: Self-pay | Admitting: Medical

## 2022-09-30 NOTE — Telephone Encounter (Signed)
Pt has been on 12.5mg , please advise to titrate up or down

## 2022-09-30 NOTE — Telephone Encounter (Signed)
[facility] called [facility] not have moun[facility]  [facility] pharmacy does have but they have  7.5 and 15 mg    please call pt

## 2022-10-05 NOTE — Telephone Encounter (Signed)
Left message for pt to call me back 

## 2022-10-06 MED ORDER — MOUNJARO 12.5 MG/0.5ML ~~LOC~~ SOAJ: SUBCUTANEOUS | 0 refills | Status: DC

## 2022-10-06 NOTE — Telephone Encounter (Signed)
Pt states she would like 12.5mg  [facility]. I have refilled this of her

## 2022-11-30 ENCOUNTER — Other Ambulatory Visit: Payer: 59

## 2023-01-06 ENCOUNTER — Telehealth: Payer: Self-pay | Admitting: Medical

## 2023-01-06 NOTE — Telephone Encounter (Signed)
Pt wants to know if she is due for any vaccines other than flu and covid?

## 2023-01-07 ENCOUNTER — Other Ambulatory Visit (INDEPENDENT_AMBULATORY_CARE_PROVIDER_SITE_OTHER): Payer: 59

## 2023-01-07 ENCOUNTER — Telehealth: Payer: Self-pay | Admitting: Medical

## 2023-01-07 DIAGNOSIS — Z23 Encounter for immunization: Secondary | ICD-10-CM

## 2023-01-07 NOTE — Telephone Encounter (Signed)
Please call, pt wanted to schedule pneumonia vaccine but she has had several and I don't think she is due for anything but will you check please

## 2023-01-07 NOTE — Telephone Encounter (Signed)
Pt was notified.  

## 2023-01-07 NOTE — Telephone Encounter (Signed)
Please advise 

## 2023-01-11 ENCOUNTER — Ambulatory Visit: Payer: 59 | Admitting: Medical

## 2023-01-18 ENCOUNTER — Telehealth: Payer: Self-pay

## 2023-01-18 NOTE — Patient Outreach (Signed)
Attempted to contact patient regarding care gaps. Left voicemail for patient to return my call at [phone].  Nicholes Rough, CMA Care Guide [employer]

## 2023-03-07 IMAGING — MG MM DIGITAL SCREENING BILAT W/ TOMO AND CAD
8 series · 8 of 24 positions shown · non-contrast
Comparison: Previous exam(s).

CLINICAL DATA: Screening.

EXAM:
DIGITAL SCREENING BILATERAL MAMMOGRAM WITH TOMOSYNTHESIS AND CAD
TECHNIQUE: Bilateral screening digital craniocaudal and mediolateral oblique
mammograms were obtained. Bilateral screening digital breast
tomosynthesis was performed. The images were evaluated with
computer-aided detection.

[L MLO synth-2D]
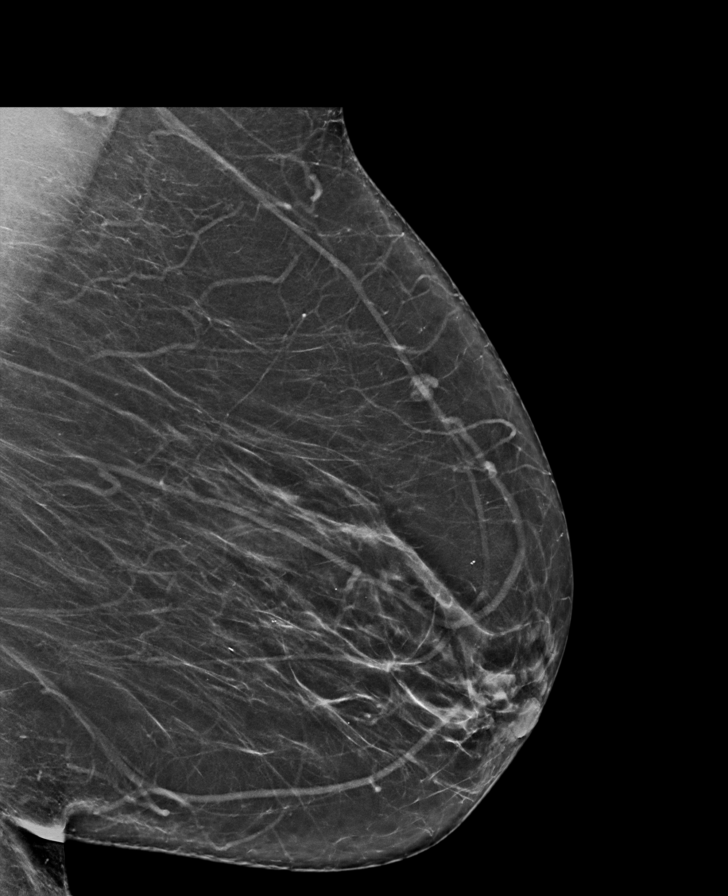

[R MLO synth-2D]
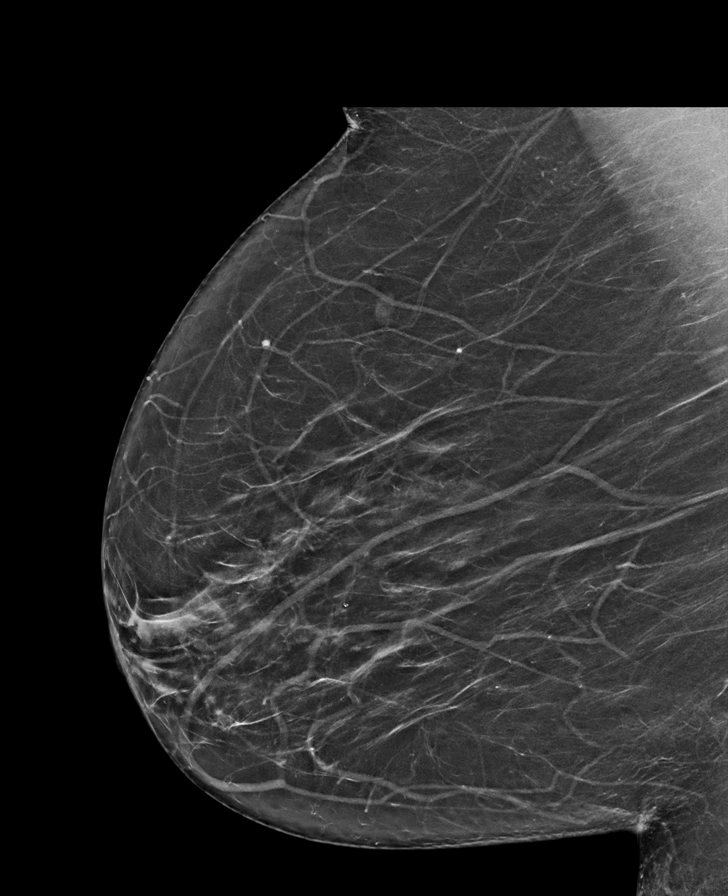

[R CC synth-2D]
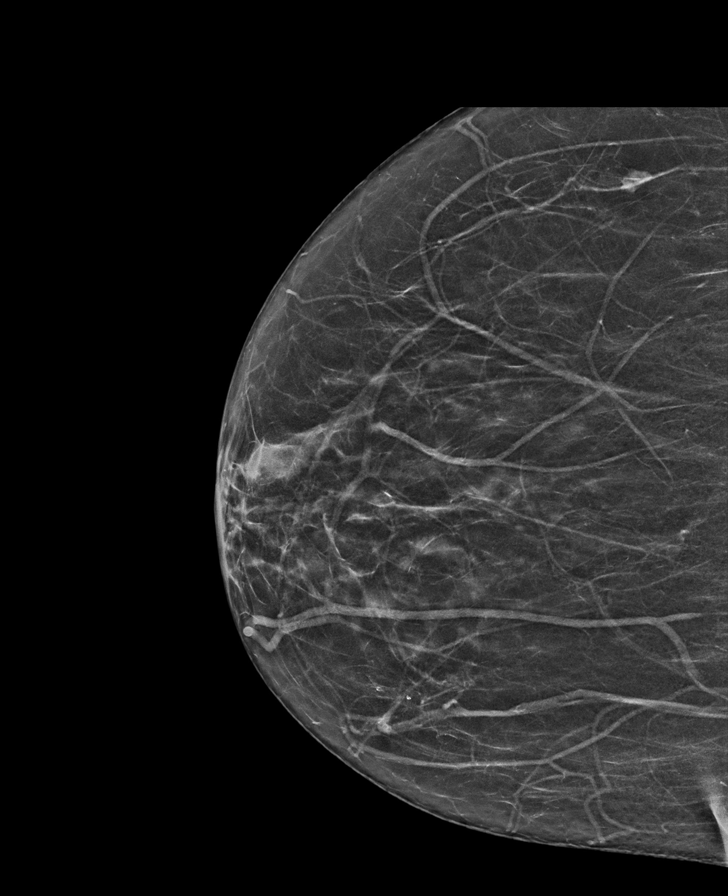

[L CC synth-2D]
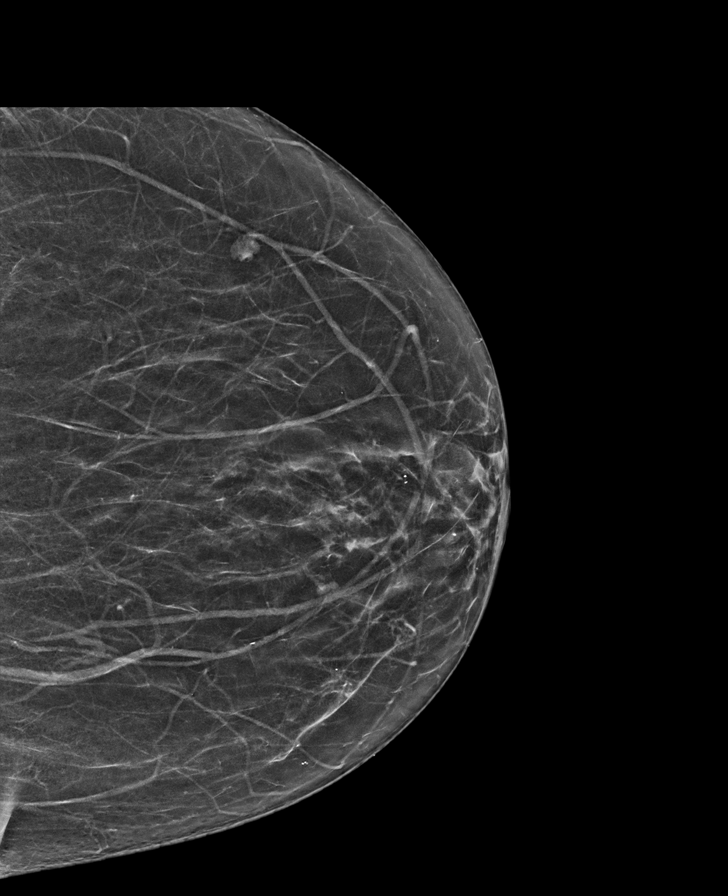

[R CC tomo · tomo slice 31/62.0]
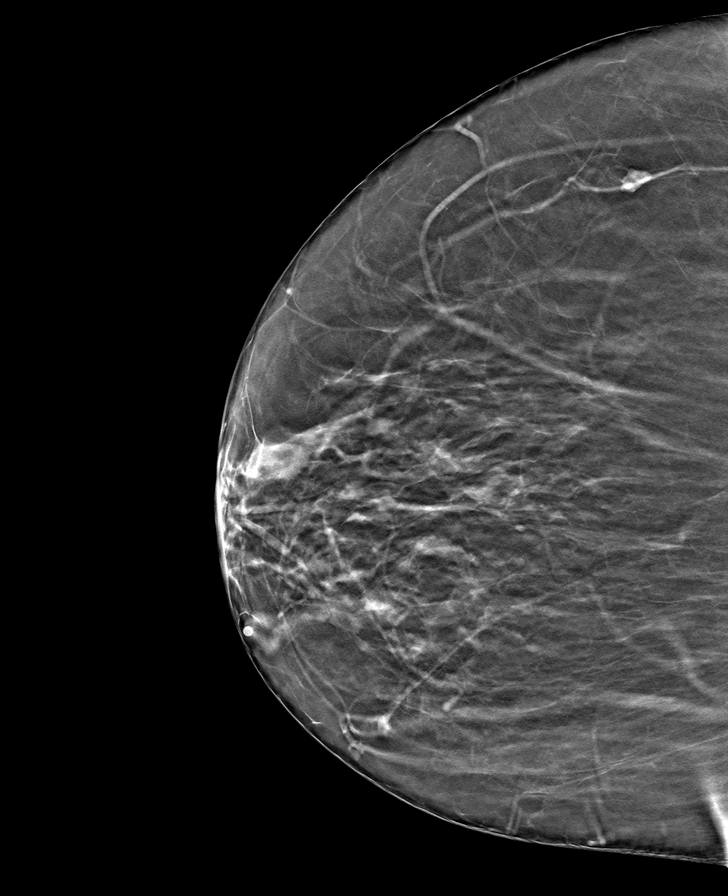

[R MLO tomo · tomo slice 39/76.0]
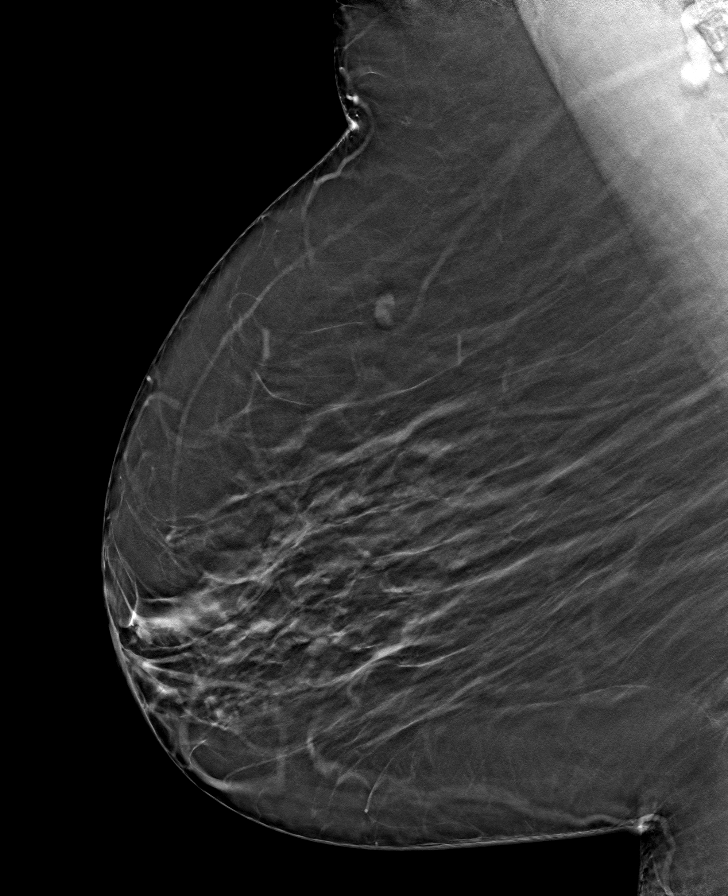

[L MLO tomo · tomo slice 38/75.0]
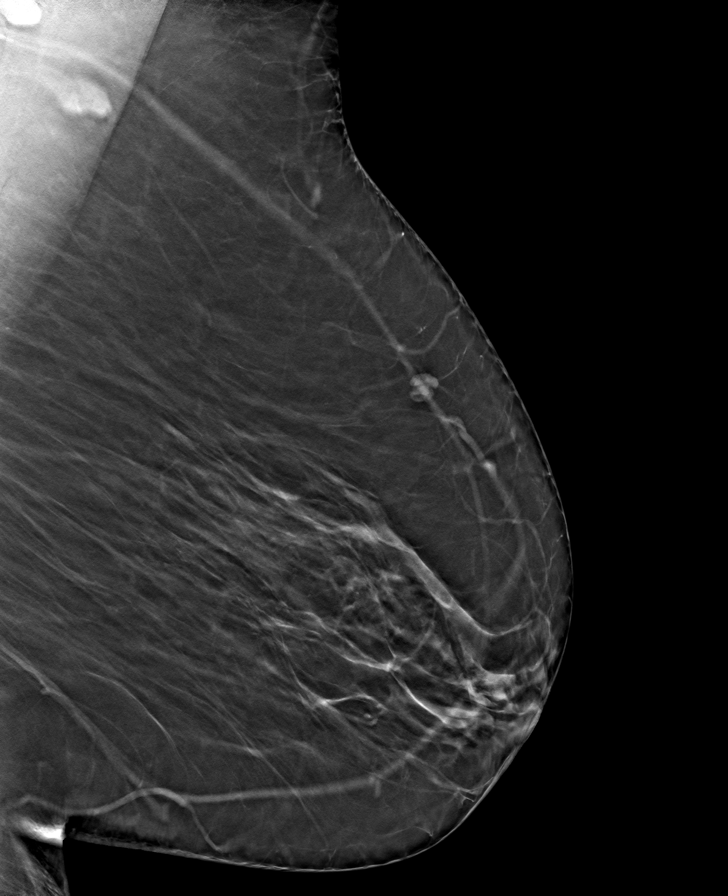

[L CC tomo · tomo slice 33/64.0]
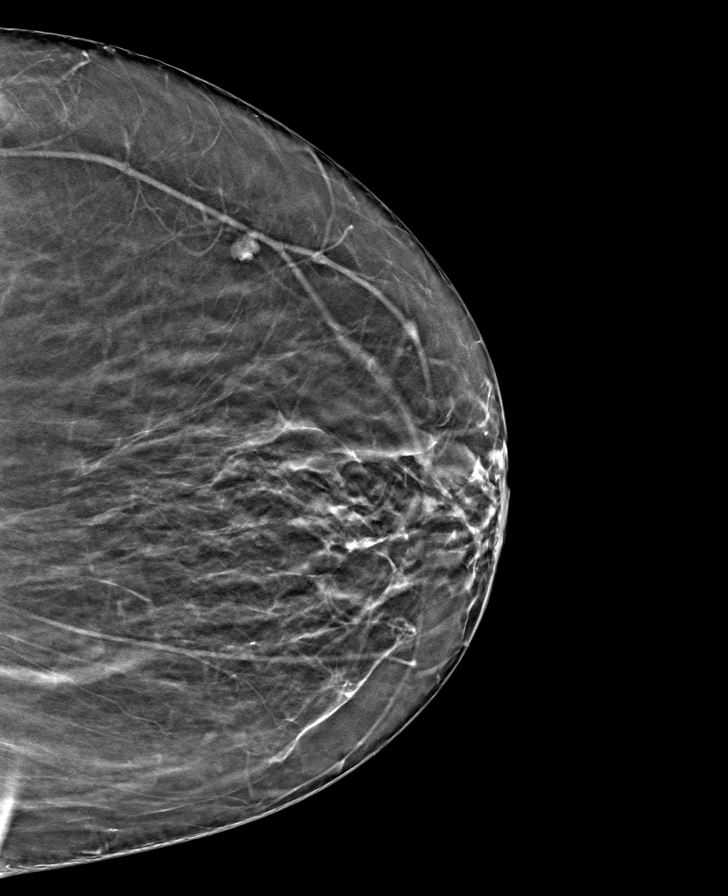

[8 of 24 positions shown; findings below may reference images not displayed]

ACR Breast Density Category b: There are scattered areas of
fibroglandular density.
FINDINGS: There are no findings suspicious for malignancy.
IMPRESSION: No mammographic evidence of malignancy. A result letter of this
screening mammogram will be mailed directly to the patient.

RECOMMENDATION:
Screening mammogram in one year. (Code:51-O-LD2)

BI-RADS CATEGORY  1: Negative.

## 2023-03-11 ENCOUNTER — Other Ambulatory Visit (INDEPENDENT_AMBULATORY_CARE_PROVIDER_SITE_OTHER): Payer: Medicare Other

## 2023-03-11 DIAGNOSIS — Z23 Encounter for immunization: Secondary | ICD-10-CM | POA: Diagnosis not present

## 2023-03-15 ENCOUNTER — Ambulatory Visit (INDEPENDENT_AMBULATORY_CARE_PROVIDER_SITE_OTHER): Payer: Medicare Other | Admitting: Medical

## 2023-03-15 VITALS — BP 124/70 | HR 64 | Temp 98.4°F | Wt 187.8 lb

## 2023-03-15 DIAGNOSIS — B356 Tinea cruris: Secondary | ICD-10-CM | POA: Insufficient documentation

## 2023-03-15 DIAGNOSIS — E1169 Type 2 diabetes mellitus with other specified complication: Secondary | ICD-10-CM

## 2023-03-15 DIAGNOSIS — E118 Type 2 diabetes mellitus with unspecified complications: Secondary | ICD-10-CM | POA: Diagnosis not present

## 2023-03-15 DIAGNOSIS — I10 Essential (primary) hypertension: Secondary | ICD-10-CM | POA: Diagnosis not present

## 2023-03-15 DIAGNOSIS — E785 Hyperlipidemia, unspecified: Secondary | ICD-10-CM

## 2023-03-15 DIAGNOSIS — E039 Hypothyroidism, unspecified: Secondary | ICD-10-CM

## 2023-03-15 DIAGNOSIS — R21 Rash and other nonspecific skin eruption: Secondary | ICD-10-CM

## 2023-03-15 LAB — LIPID PANEL

## 2023-03-15 MED ORDER — ATORVASTATIN CALCIUM 40 MG PO TABS: 40.0000 mg | ORAL_TABLET | Freq: Every day | ORAL | 2 refills | Status: DC

## 2023-03-15 MED ORDER — TERBINAFINE HCL 1 % EX CREA: 1.0000 | TOPICAL_CREAM | Freq: Every day | CUTANEOUS | 0 refills | Status: DC

## 2023-03-15 MED ORDER — FLUCONAZOLE 100 MG PO TABS: 100.0000 mg | ORAL_TABLET | Freq: Every day | ORAL | 0 refills | Status: DC

## 2023-03-15 MED ORDER — LISINOPRIL-HYDROCHLOROTHIAZIDE 20-12.5 MG PO TABS: 1.0000 | ORAL_TABLET | Freq: Every day | ORAL | 2 refills | Status: DC

## 2023-03-15 MED ORDER — TIRZEPATIDE 15 MG/0.5ML ~~LOC~~ SOAJ: 15.0000 mg | SUBCUTANEOUS | 2 refills | Status: DC

## 2023-03-15 MED ORDER — NYSTATIN 100000 UNIT/GM EX POWD: 1.0000 | Freq: Two times a day (BID) | CUTANEOUS | 0 refills | Status: DC

## 2023-03-15 NOTE — Progress Notes (Signed)
Subjective:  Kathryn [name] is a 73 y.o. female who presents for Chief Complaint  Patient presents with   Medical Management of Chronic Issues    Med check, would like Antibiotic for her rash infection at her private part     Here for med check  Diabetes - checking glucose, they usually run ok.  Compliant with Mounjaro 12.5mg  weekly.   No major side effects.  HTN - compliant with Lisinopril HCT 20/12.5mg  daily  Hyperlipidemia - compliant with atorvastatin 40mg  daily.   Hypothyroidism - compliant with levothyroxine daily.    Getting some exercise.  Doesn't eat a lot of meat.   Does eat a lot of vegetables, chicken and some fish.    She notes rash in private parts on the side.  Rash is itchy.   No concern for STD.   No vaginal discharge, no urinary changes.  No other aggravating or relieving factors.    No other c/o.  Past Medical History:  Diagnosis Date   Anemia    Diabetes mellitus    Diverticula of colon    per colonoscopy6/20/11   Edema of lower extremity    wears ted hose   H/O echocardiogram 2014   EF 65%, slight gradient of aortic valve, no major findings otherwise   Hx of cardiovascular stress test    Adenosine Myoview 04/2008:  Normal.   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 90 tablet 3   levothyroxine (SYNTHROID) 25 MCG tablet Take 1 tablet (25 mcg total) by mouth daily before breakfast. 90 tablet 1   tirzepatide (MOUNJARO) 12.5 MG/0.5ML Pen INJECT THE CONTENTS OF ONE PEN  SUBCUTANEOUSLY WEEKLY AS  DIRECTED 6 mL 0   Insulin Pen Needle (B-D UF III MINI  PEN NEEDLES) 31G X 5 MM MISC 1 EACH BY DOES NOT APPLY ROUTE AS DIRECTED. 100 each 11   lidocaine (XYLOCAINE) 2 % solution Use as directed 5-15 mLs in the mouth or throat every 4 (four) hours as needed for mouth pain. (Patient not taking: Reported on 03/15/2023) 60 mL 0   No current facility-administered medications on file prior to visit.     The following portions of the patient's history were reviewed and updated as appropriate: allergies, current medications, past family history, past medical history, past social history, past surgical history and problem list.  ROS Otherwise as in subjective above    Objective: BP 124/70   Pulse 64   Temp 98.4 F (36.9 C)   Wt 187 lb 12.8 oz (85.2 kg)   LMP  (LMP Unknown)   SpO2 98%   BMI 30.78 kg/m   Wt Readings from Last 3 Encounters:  03/15/23 187 lb 12.8 oz (85.2 kg)  05/14/22 179 lb 12.8 oz (81.6 kg)  04/06/22 174 lb 9.6 oz (79.2 kg)   General appearance :  alert, no distress, well developed, well nourished Heart: 3 out of 6 brief holosystolic murmur heard in the upper sternal borders, normal S1-S2 otherwise Lungs clear No extremity edema Skin:widespread pink irritated skin along  under pannus and inner upper thighs c/w tinea  Diabetic Foot Exam - Simple   Simple Foot Form Diabetic Foot exam was performed with the following findings: Yes 03/15/2023  2:10 PM  Visual Inspection No deformities, no ulcerations, no other skin breakdown bilaterally: Yes Sensation Testing Intact to touch and monofilament testing bilaterally: Yes Pulse Check Posterior Tibialis and Dorsalis pulse intact bilaterally: Yes Comments       Assessment: Encounter Diagnoses  Name Primary?   Diabetes mellitus type 2 with complications (HCC) Yes   Hypothyroidism, unspecified type    Hyperlipidemia associated with type 2 diabetes mellitus (HCC)    Essential hypertension    Rash    Tinea cruris       Plan: Hypertension-continue current medication  lisinopril HCT 20/12.5 mg daily  Diabetes-continue Mounjaro, but increase to the 15 mg weekly.    Hypothyroidism-updated labs today.  continue levothyroxine 25 mcg daily  Hyperlipidemia-continue statin.  Updated labs today  Rash/tinea -  Tinea rash in the genital area and underneath the abdomen fold Begin oral Diflucan 1 tablet daily for 7 days Begin nystatin powder twice a day in the morning and at lunch to the rash affected area After bathing at night and blotting the area dry you can do the Lamisil cream at bedtime to the same area If not much improved within 10 days let me know    Kathryn [name] was seen today for medical management of chronic issues.  Diagnoses and all orders for this visit:  Diabetes mellitus type 2 with complications (HCC) -     Hemoglobin A1c -     Comprehensive metabolic panel -     CBC  Hypothyroidism, unspecified type -     TSH + free T4  Hyperlipidemia associated with type 2 diabetes mellitus (HCC) -     Hemoglobin A1c -     Lipid panel  Essential hypertension -     Comprehensive metabolic panel  Rash  Tinea cruris  Other orders -     tirzepatide (MOUNJARO) 15 MG/0.5ML Pen; Inject 15 mg into the skin once a week. -     atorvastatin (LIPITOR) 40 MG tablet; Take 1 tablet (40 mg total) by mouth daily. -     lisinopril-hydrochlorothiazide (ZESTORETIC) 20-12.5 MG tablet; Take 1 tablet by mouth daily. -     fluconazole (DIFLUCAN) 100 MG tablet; Take 1 tablet (100 mg total) by mouth daily. -     nystatin (MYCOSTATIN/NYSTOP) powder; Apply 1 Application topically 2 (two) times daily. -     terbinafine (LAMISIL) 1 % cream; Apply 1 Application topically at bedtime.   Follow up: Pending labs

## 2023-03-15 NOTE — Patient Instructions (Signed)
Tinea rash in the genital area and underneath the abdomen fold Begin oral Diflucan 1 tablet daily for 7 days Begin nystatin powder twice a day in the morning and at lunch to the rash affected area After bathing at night and blotting the area dry you can do the Lamisil cream at bedtime to the same area If not much improved within 10 days let me know   Yeast (Candida Auris) Infection: What to Know Candida auris infection is an infection with a type of yeast. Yeast is a type of fungus. This infection is treated with antifungal medicines. The yeast can be resistant to common antifungal medicines. This means that certain antifungals will not work to treat a Candida auris infection. Candida auris can be on your skin or in your nose without causing problems. This is called colonization or being colonized. If you're colonized with the yeast, you don't have an infection and you don't have symptoms. But, if the yeast gets into your body through a cut, a sore, or a medical device or tube, it can cause an infection. Candida auris infection can cause big problems in people who have a weak body defense system. This is also called the immune system. What are the causes? Candida auris infection is caused by yeast. You can get this yeast by: Touching something with yeast on it and then touching your mouth, nose, or eyes. Having a cut or wound where yeast can get into your body. Having a device in your body, such as: An IV. A soft tube called a catheter in your bladder. A breathing tube. A feeding tube. Candida auris can spread easily in health care clinics and hospitals. What increases the risk? You're more likely to get this infection if: You're in the hospital for a long time. The risk may be higher if you're in an intensive care unit (ICU) or long-term care facility. You have a tube or medical device in your body. You have long-term (chronic) kidney disease or are on dialysis. You have a weak immune  system. You've been on antibiotics or antifungals. What are the signs or symptoms? Candida auris can cause infections in different parts of the body. The symptoms depend on where the infection is and how bad it is. You may have these signs of infection: Fever. Chills. Tiredness. How is this diagnosed? Your provider will talk with you about your medical history and do a physical exam. Tests might be done to check for the yeast on your skin. A swab is used to rub your skin and get cells for testing. You may also have tests of your blood or pee (urine). How is this treated? An antifungal medicine is used to kill the yeast. Candida auris is resistant to many types of antifungals, but a medicine will be found to treat your infection. Follow these instructions at home: General instructions  Take your medicines only as told. Wash your hands with soap and water for at least 20 seconds. If you don't have soap and water, use hand sanitizer. Prevent the spread of germs. How is this prevented? Things your health care team can do to stop the spread of germs: Clean and disinfect your room and the things used for your care. Cleaning with soap and water removes germs and dirt. Disinfecting means killing germs. Put you in a private room. Wear gowns and gloves when they care for you. Wash their hands before and after they care for you. Things you can do to stop the spread  of germs: Wash your hands often. Try to not be in close contact with others. Do not share personal items. These include: Towels. Razors. Toothbrushes. Bedding. Wash towels, bedding, and clothes in the washing machine with detergent and hot water. Dry them in a hot dryer. Clean and disinfect objects that are touched a lot, like water taps and faucets, handles, and door knobs. Cleaners with bleach or alcohol help to kill yeast. Read the product label to make sure it will kill yeast. Where to find more information To learn more: Go  to DiningCalendar.de. Click the search box. Type "About C. Auris" in the search box. Contact a provider if: You have any new symptoms. Your symptoms get worse. This information is not intended to replace advice given to you by your health care provider. Make sure you discuss any questions you have with your health care provider. Document Revised: 07/16/2022 Document Reviewed: 07/16/2022 Elsevier Patient Education  2024 [employer].

## 2023-03-16 ENCOUNTER — Other Ambulatory Visit: Payer: Self-pay | Admitting: Medical

## 2023-03-16 LAB — CBC
Hematocrit: 35.8 % (ref 34.0–46.6)
Hemoglobin: 11.5 g/dL (ref 11.1–15.9)
MCH: 26.4 pg — ABNORMAL LOW (ref 26.6–33.0)
MCHC: 32.1 g/dL (ref 31.5–35.7)
MCV: 82 fL (ref 79–97)
Platelets: 260 10*3/uL (ref 150–450)
RBC: 4.36 x10E6/uL (ref 3.77–5.28)
RDW: 13.6 % (ref 11.7–15.4)
WBC: 2.9 10*3/uL — ABNORMAL LOW (ref 3.4–10.8)

## 2023-03-16 LAB — COMPREHENSIVE METABOLIC PANEL
ALT: 10 IU/L (ref 0–32)
AST: 17 IU/L (ref 0–40)
Albumin: 4.1 g/dL (ref 3.9–4.9)
Alkaline Phosphatase: 120 IU/L (ref 44–121)
BUN/Creatinine Ratio: 9 — ABNORMAL LOW (ref 12–28)
BUN: 10 mg/dL (ref 8–27)
Bilirubin Total: 0.5 mg/dL (ref 0.0–1.2)
CO2: 24 mmol/L (ref 20–29)
Calcium: 10.4 mg/dL — ABNORMAL HIGH (ref 8.7–10.3)
Chloride: 102 mmol/L (ref 96–106)
Creatinine, Ser: 1.07 mg/dL — ABNORMAL HIGH (ref 0.57–1.00)
Globulin, Total: 2.9 g/dL (ref 1.5–4.5)
Glucose: 79 mg/dL (ref 70–99)
Potassium: 3.7 mmol/L (ref 3.5–5.2)
Sodium: 142 mmol/L (ref 134–144)
Total Protein: 7 g/dL (ref 6.0–8.5)
eGFR: 56 mL/min/{1.73_m2} — ABNORMAL LOW (ref >59)

## 2023-03-16 LAB — LIPID PANEL
Cholesterol, Total: 175 mg/dL (ref 100–199)
HDL: 60 mg/dL (ref >39)
LDL CALC COMMENT:: 2.9 ratio (ref 0.0–4.4)
LDL Chol Calc (NIH): 102 mg/dL — ABNORMAL HIGH (ref 0–99)
Triglycerides: 71 mg/dL (ref 0–149)
VLDL Cholesterol Cal: 13 mg/dL (ref 5–40)

## 2023-03-16 LAB — HEMOGLOBIN A1C
Est. average glucose Bld gHb Est-mCnc: 114 mg/dL
Hgb A1c MFr Bld: 5.6 % (ref 4.8–5.6)

## 2023-03-16 LAB — TSH+FREE T4
Free T4: 1.04 ng/dL (ref 0.82–1.77)
TSH: 3.04 u[IU]/mL (ref 0.450–4.500)

## 2023-03-16 MED ORDER — LEVOTHYROXINE SODIUM 25 MCG PO TABS: 25.0000 ug | ORAL_TABLET | Freq: Every day | ORAL | 1 refills | Status: DC

## 2023-03-16 NOTE — Progress Notes (Signed)
Kidney marker stable, calcium still little elevated.  Cholesterol still too high.  All the labs are stable.  Are you taking your cholesterol medicine every single day?  Increase to the Mounjaro 15 mg weekly dose  Continue rest of medicines as usual  I recommend a follow-up in 6 to 8 weeks.  At that time we need to see where you are with your weight and blood pressure and recheck the calcium labs

## 2023-03-31 ENCOUNTER — Telehealth: Payer: Self-pay

## 2023-03-31 ENCOUNTER — Other Ambulatory Visit (HOSPITAL_COMMUNITY): Payer: Self-pay

## 2023-03-31 NOTE — Telephone Encounter (Signed)
 Looks like Kathryn [name] is wanting her information to be updated under her [employer], so this is maybe a registration thing, I am not sure how this is done.

## 2023-03-31 NOTE — Telephone Encounter (Signed)
 Also she is coming in on 2/13

## 2023-03-31 NOTE — Telephone Encounter (Signed)
 Can one of yall look into this and send back to RX POOL

## 2023-03-31 NOTE — Telephone Encounter (Signed)
 I have received a request via CMM from the patients pref'd pahrmacy to do a Prior Authorization for the patient Mounjaro . However the patients must have some updated information (Phone number and Zipcode) under her Kathryn [name] because It will not allow me to process without the information I have. I have called the patient -no answer, and call the pharmacy and they have the same information I have. Please contact the patient and verify.

## 2023-04-01 NOTE — Telephone Encounter (Addendum)
 I called & left a message on cell # & home # no longer valid, looks like we have a new [facility] Medicare card on file.

## 2023-04-01 NOTE — Telephone Encounter (Signed)
 [facility] Medicare care no longer valid

## 2023-04-08 ENCOUNTER — Ambulatory Visit: Payer: Medicare Other | Admitting: Medical

## 2023-04-09 ENCOUNTER — Other Ambulatory Visit (HOSPITAL_COMMUNITY): Payer: Self-pay

## 2023-04-13 ENCOUNTER — Telehealth: Payer: Self-pay

## 2023-04-13 ENCOUNTER — Telehealth: Payer: Self-pay | Admitting: Medical

## 2023-04-13 ENCOUNTER — Other Ambulatory Visit (HOSPITAL_COMMUNITY): Payer: Self-pay

## 2023-04-13 ENCOUNTER — Other Ambulatory Visit: Payer: Medicare Other

## 2023-04-13 NOTE — Telephone Encounter (Signed)
Pt called & states her U[employer] is valid & she was actually seen in the office today & it verified.  She does not have Tricare & said never had it.  Please do P.A. for [facility] with the [facility] Medicare.  Thanks

## 2023-04-13 NOTE — Telephone Encounter (Signed)
Please see the attached as B.I.V was completed to determine the patients newly found benefits effective dates.    Pts id#number has changed and is [id number]  Provider customer service line [phone]

## 2023-04-13 NOTE — Telephone Encounter (Signed)
Pharmacy Patient Advocate Encounter   Received notification from Physician's Office that prior authorization for Mounjaro 15MG /0.5ML auto-injectors  is required/requested.   Insurance verification completed.   The patient is insured through [facility].   Per test claim: PA required; PA submitted to above mentioned insurance via CoverMyMeds Key/confirmation #/EOC (Key: [id number])      Status is pending

## 2023-04-13 NOTE — Telephone Encounter (Signed)
Pharmacy Patient Advocate Encounter  Received notification from [facility] MEDICARE that Prior Authorization for [facility] 15MG /0.5ML auto-injectors has been APPROVED from 2.18.25 to 12.31.25   PA #/Case ID/Reference #: (Key: [id number])

## 2023-04-14 NOTE — Telephone Encounter (Signed)
Called pharmacy went thru for $12, pt was informed by pharmacy

## 2023-04-15 ENCOUNTER — Ambulatory Visit (INDEPENDENT_AMBULATORY_CARE_PROVIDER_SITE_OTHER): Payer: Medicare Other | Admitting: Medical

## 2023-04-15 VITALS — BP 110/62 | HR 64 | Temp 98.8°F | Wt 196.8 lb

## 2023-04-15 DIAGNOSIS — R0981 Nasal congestion: Secondary | ICD-10-CM

## 2023-04-15 DIAGNOSIS — J3489 Other specified disorders of nose and nasal sinuses: Secondary | ICD-10-CM | POA: Diagnosis not present

## 2023-04-15 DIAGNOSIS — R051 Acute cough: Secondary | ICD-10-CM

## 2023-04-15 DIAGNOSIS — R0989 Other specified symptoms and signs involving the circulatory and respiratory systems: Secondary | ICD-10-CM | POA: Diagnosis not present

## 2023-04-15 LAB — POCT INFLUENZA A/B
Influenza A, POC: NEGATIVE
Influenza B, POC: NEGATIVE

## 2023-04-15 LAB — POC COVID19 BINAXNOW: SARS Coronavirus 2 Ag: NEGATIVE

## 2023-04-15 MED ORDER — AMOXICILLIN 875 MG PO TABS: 875.0000 mg | ORAL_TABLET | Freq: Two times a day (BID) | ORAL | 0 refills | Status: AC

## 2023-04-15 MED ORDER — PSEUDOEPHEDRINE-GUAIFENESIN ER 60-600 MG PO TB12: 1.0000 | ORAL_TABLET | Freq: Two times a day (BID) | ORAL | 0 refills | Status: DC

## 2023-04-15 NOTE — Progress Notes (Signed)
Subjective:  Kathryn [name] is a 73 y.o. female who presents for Chief Complaint  Patient presents with   Cough    Cough, sneezing, itchy eyes, congestion, runny nose, drainage x 1 week.       Here for illness symptoms.  Started about a week ago.  She notes sneezing, runny nose, sinus drainage, itchy eyes.  Has some cough, had sore throat but it improved.  Had fever but that improved.  Had body aches and chills but that has improved.  Using some advil.  No sick contacts.  No other aggravating or relieving factors.  No other c/o.  Past Medical History:  Diagnosis Date   Anemia    Diabetes mellitus    Diverticula of colon    per colonoscopy6/20/11   Edema of lower extremity    wears ted hose   H/O echocardiogram 2014   EF 65%, slight gradient of aortic valve, no major findings otherwise   Hx of cardiovascular stress test    Adenosine Myoview 04/2008:  Normal.   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 90 tablet 2   cholecalciferol (VITAMIN D3) 25 MCG (1000 UNIT) tablet Take 1 tablet (1,000 Units total) by mouth daily. 90 tablet 3   levothyroxine (SYNTHROID) 25 MCG tablet Take 1 tablet (25 mcg total) by mouth daily before breakfast. 90 tablet 1   lidocaine (XYLOCAINE) 2 % solution Use as directed 5-15 mLs in the mouth or throat every 4 (four) hours as needed for mouth pain. 60 mL 0   lisinopril-hydrochlorothiazide (ZESTORETIC) 20-12.5 MG tablet Take 1 tablet by mouth daily. 90 tablet 2   Insulin Pen Needle (B-D UF III MINI PEN NEEDLES) 31G X 5 MM MISC 1 EACH BY DOES NOT APPLY ROUTE AS DIRECTED. 100 each 11    terbinafine (LAMISIL) 1 % cream Apply 1 Application topically at bedtime. (Patient not taking: Reported on 04/15/2023) 42 g 0   tirzepatide (MOUNJARO) 15 MG/0.5ML Pen Inject 15 mg into the skin once a week. (Patient not taking: Reported on 04/15/2023) 6 mL 2   No current facility-administered medications on file prior to visit.     The following portions of the patient's history were reviewed and updated as appropriate: allergies, current medications, past family history, past medical history, past social history, past surgical history and problem list.  ROS Otherwise as in subjective above  Objective: BP 110/62   Pulse 64   Temp 98.8 F (37.1 C)   Wt 196 lb 12.8 oz (89.3 kg)   LMP  (LMP Unknown)   SpO2 100%   BMI 32.25 kg/m   General appearance: alert, no distress, well developed, well nourished HEENT: normocephalic, sclerae anicteric, conjunctiva pink and moist, TMs flat but no erythema or air fluid levels, nares with some turbinated edema and erythema, mucoid discharge, pharynx normal Oral cavity: MMM, no lesions Neck: supple, no lymphadenopathy, no thyromegaly, no masses Heart: RRR, normal S1, S2, no murmurs Lungs: CTA bilaterally, no wheezes, rhonchi, or rales Pulses: 2+ radial pulses, 2+ pedal pulses, normal cap refill Ext:  no edema   Assessment: Encounter Diagnoses  Name Primary?   Acute cough Yes   Head congestion    Runny nose    Sinus pressure      Plan: Discussed symptom, findings.  Recommendations:  I sent 2 medicines to your pharmacy today.  Begin Mucinex DM twice a day for the next 5 days to help with mucus and congestion You can use over-the-counter allergy eyedrops such as Pataday or you can use allergy pill over-the-counter such as Zyrtec or Claritin for the next week to help with some of the other symptoms I did send antibiotic to the pharmacy amoxicillin if you feel your symptoms have worsened Make sure you are drinking plenty of water throughout the  day such as 8 glasses of water daily Follow up if not improving   [name] was seen today for cough.  Diagnoses and all orders for this visit:  Acute cough -     POC COVID-19 BinaxNow -     POCT Influenza A/B  Head congestion -     POC COVID-19 BinaxNow -     POCT Influenza A/B  Runny nose -     POC COVID-19 BinaxNow -     POCT Influenza A/B  Sinus pressure  Other orders -     pseudoephedrine-guaifenesin (MUCINEX D) 60-600 MG 12 hr tablet; Take 1 tablet by mouth every 12 (twelve) hours. -     amoxicillin (AMOXIL) 875 MG tablet; Take 1 tablet (875 mg total) by mouth 2 (two) times daily for 10 days.   Follow up: prn

## 2023-04-15 NOTE — Patient Instructions (Signed)
Recommendations:  I sent 2 medicines to your pharmacy today.  Begin Mucinex DM twice a day for the next 5 days to help with mucus and congestion You can use over-the-counter allergy eyedrops such as Pataday or you can use allergy pill over-the-counter such as Zyrtec or Claritin for the next week to help with some of the other symptoms I did send antibiotic to the pharmacy amoxicillin if you feel your symptoms have worsened Make sure you are drinking plenty of water throughout the day such as 8 glasses of water daily Follow up if not improving

## 2023-05-06 ENCOUNTER — Ambulatory Visit: Payer: Medicare Other | Admitting: Medical

## 2023-08-24 ENCOUNTER — Telehealth: Payer: Self-pay | Admitting: Medical

## 2023-08-24 DIAGNOSIS — E118 Type 2 diabetes mellitus with unspecified complications: Secondary | ICD-10-CM

## 2023-08-24 DIAGNOSIS — Z79899 Other long term (current) drug therapy: Secondary | ICD-10-CM

## 2023-08-24 DIAGNOSIS — R4189 Other symptoms and signs involving cognitive functions and awareness: Secondary | ICD-10-CM

## 2023-08-24 NOTE — Telephone Encounter (Signed)
 Pt stopped by office & I spoke with pt regarding her medications when she came in & she was very confused on what she was taking, but said she needed refills on her fluid pill.  Called pharmacy & she had refills left on all her meds, pharmacist said they hadn't been picked up at all this year.  Ludie do you want to see patient or have Jon reach out to her?

## 2023-08-24 NOTE — Telephone Encounter (Signed)
 Pt requesting a refill on mounjaro  to  [id number] - [place], [place] - [address]

## 2023-08-25 NOTE — Telephone Encounter (Signed)
 Referral placed.

## 2023-08-26 ENCOUNTER — Telehealth: Payer: Self-pay

## 2023-08-26 NOTE — Progress Notes (Signed)
 Complex Care Management Note Care Guide Note  08/26/2023 Name: Kathryn [name] MRN: [id number] DOB: 09-14-1950   Complex Care Management Outreach Attempts: An unsuccessful telephone outreach was attempted today to offer the patient information about available complex care management services.  Follow Up Plan:  Additional outreach attempts will be made to offer the patient complex care management information and services.   Encounter Outcome:  No Answer  [name] [facility], [facility] Guide  Direct Dial: [phone]  Fax [phone]

## 2023-09-06 NOTE — Progress Notes (Unsigned)
 Care Guide Pharmacy Note  09/06/2023 Name: Kathryn [name] MRN: [id number] DOB: [birth year]  Referred By: Bulah Alm RAMAN, PA-C Reason for referral: Complex Care Management (Initial Outreach to schedule wit Pharm D Jon)   Kathryn [name] is a 73 y.o. year old female who is a primary care patient of [name].  Kathryn [name] was referred to the pharmacist for assistance related to: DMII  A second unsuccessful telephone outreach was attempted today to contact the patient who was referred to the pharmacy team for assistance with medication assistance. Additional attempts will be made to contact the patient.   [name] [facility], [facility] Guide  Direct Dial: [phone]  Fax [phone]

## 2023-09-07 NOTE — Progress Notes (Signed)
 Complex Care Management Note Care Guide Note  09/07/2023 Name: Kathryn [name] MRN: [id number] DOB: 24-Aug-1950   Complex Care Management Outreach Attempts: A third unsuccessful outreach was attempted today to offer the patient with information about available complex care management services.  Follow Up Plan:  No further outreach attempts will be made at this time. We have been unable to contact the patient to offer or enroll patient in complex care management services.  Encounter Outcome:  No Answer  [name] [facility], [facility] Guide  Direct Dial: [phone]  Fax [phone]

## 2023-09-28 ENCOUNTER — Telehealth: Payer: Self-pay | Admitting: Family Medicine

## 2023-09-28 NOTE — Telephone Encounter (Signed)
 Copied from CRM [id number]. Topic: General - Other >> Sep 28, 2023 12:02 PM Graeme ORN wrote: Reason for CRM: [name] with Adult [employer] Services called. Requested call back from patient provider at [phone]. Thank You

## 2023-10-05 NOTE — Telephone Encounter (Signed)
 Called and left message for [name] to call back to speak with you as you haven't heard back

## 2023-11-03 ENCOUNTER — Other Ambulatory Visit

## 2023-11-03 ENCOUNTER — Telehealth: Payer: Self-pay | Admitting: Medical

## 2023-11-03 DIAGNOSIS — Z23 Encounter for immunization: Secondary | ICD-10-CM | POA: Diagnosis not present

## 2023-11-03 NOTE — Telephone Encounter (Signed)
 Pt came in for Covid Vaccine, gave her Rx for pharmacy

## 2023-12-15 NOTE — Progress Notes (Signed)
 Kathryn [name]                                          MRN: [id number]   12/15/2023   The VBCI Quality Team Specialist reviewed this patient medical record for the purposes of chart review for care gap closure. The following were reviewed: chart review for care gap closure-diabetic eye exam, glycemic status assessment, and kidney health evaluation for diabetes:eGFR  and uACR.    VBCI Quality Team

## 2023-12-23 ENCOUNTER — Telehealth: Payer: Self-pay

## 2023-12-23 NOTE — Telephone Encounter (Signed)
 Called Patient to reschedule Physical.

## 2023-12-29 ENCOUNTER — Telehealth: Payer: Self-pay

## 2023-12-29 NOTE — Telephone Encounter (Signed)
 Called Patient and LVM to call back to schedule missed Physical.

## 2024-02-01 ENCOUNTER — Other Ambulatory Visit (HOSPITAL_COMMUNITY): Payer: Self-pay

## 2024-02-07 ENCOUNTER — Telehealth: Payer: Self-pay | Admitting: Pharmacy Technician

## 2024-02-07 ENCOUNTER — Other Ambulatory Visit (HOSPITAL_COMMUNITY): Payer: Self-pay

## 2024-02-07 NOTE — Telephone Encounter (Addendum)
 Pharmacy Patient Advocate Encounter   Received notification from Onbase/CMM that prior authorization for Moujaro is due for renewal.   Insurance verification completed.   The patient is insured through [employer].  Action: Current PA is good through 02/23/24. Since she has an apt 03/07/24, I think it would be best for her to refill the Mounjaro  (if she's still using it) before the end of the year, so she'll have a supply to get her through her appointment, then we'll do the PA with fresh office notes after that.   Renewal KEY: [id number]

## 2024-03-07 ENCOUNTER — Ambulatory Visit

## 2024-03-30 ENCOUNTER — Ambulatory Visit: Admitting: Medical

## 2024-03-30 VITALS — BP 122/80 | HR 65 | Temp 98.6°F | Wt 222.0 lb

## 2024-03-30 DIAGNOSIS — E118 Type 2 diabetes mellitus with unspecified complications: Secondary | ICD-10-CM

## 2024-03-30 DIAGNOSIS — I358 Other nonrheumatic aortic valve disorders: Secondary | ICD-10-CM

## 2024-03-30 DIAGNOSIS — Z113 Encounter for screening for infections with a predominantly sexual mode of transmission: Secondary | ICD-10-CM

## 2024-03-30 DIAGNOSIS — R011 Cardiac murmur, unspecified: Secondary | ICD-10-CM

## 2024-03-30 DIAGNOSIS — R8290 Unspecified abnormal findings in urine: Secondary | ICD-10-CM

## 2024-03-30 DIAGNOSIS — N761 Subacute and chronic vaginitis: Secondary | ICD-10-CM

## 2024-03-30 DIAGNOSIS — R350 Frequency of micturition: Secondary | ICD-10-CM

## 2024-03-30 DIAGNOSIS — I10 Essential (primary) hypertension: Secondary | ICD-10-CM

## 2024-03-30 LAB — POCT URINALYSIS DIP (PROADVANTAGE DEVICE)
Bilirubin, UA: NEGATIVE
Blood, UA: NEGATIVE
Glucose, UA: NEGATIVE mg/dL
Ketones, POC UA: NEGATIVE mg/dL
Leukocytes, UA: NEGATIVE
Nitrite, UA: NEGATIVE
Protein Ur, POC: NEGATIVE mg/dL
Specific Gravity, Urine: 1.025
Urobilinogen, Ur: NEGATIVE
pH, UA: 6

## 2024-03-30 MED ORDER — FLUCONAZOLE 150 MG PO TABS: 150.0000 mg | ORAL_TABLET | ORAL | 0 refills | Status: AC

## 2024-03-30 MED ORDER — SULFAMETHOXAZOLE-TRIMETHOPRIM 800-160 MG PO TABS: 1.0000 | ORAL_TABLET | Freq: Two times a day (BID) | ORAL | 0 refills | Status: AC

## 2024-03-30 NOTE — Progress Notes (Unsigned)
 "  Name: Kathryn [name]   Date of Visit: 03/31/24   CHIEF COMPLAINT:  Chief Complaint  Patient presents with   Acute Visit    UTI- frequency and urge, been going on for a while now       HPI:  Discussed the use of AI scribe software for clinical note transcription with the patient, who gave verbal consent to proceed.  History of Present Illness   She complains of possible urinary tract infection.  She notes over a week of urinary frequency, urgency, urine odor, but no blood in the urine, no cloudy urine.  No fever.  No bodyaches or chills.  No vaginal discharge.  She does feel like she has frequent urine in general for months.  No polydipsia or blurred vision.  She has been out of Mounjaro  for a while and just has not been taking it and has not called us  about a refill.  She is not checking blood sugars of late.  She reports that she is taking her blood pressure medicine and thyroid  medicine  No other aggravating or relieving factors. No other complaint.  Past Medical History:  Diagnosis Date   Anemia    Diabetes mellitus    Diverticula of colon    per colonoscopy6/20/11   Edema of lower extremity    wears ted hose   H/O echocardiogram 2014   EF 65%, slight gradient of aortic valve, no major findings otherwise   Hx of cardiovascular stress test    Adenosine Myoview 04/2008:  Normal.   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   enlarged heterogeneous thyroid  gland on ultrasound   Tubulovillous adenoma    Wears dentures    upper   Wears glasses    Medications Ordered Prior to Encounter[1]  Review of systems as in subjective   OBJECTIVE:    BP 122/80   Pulse 65   Temp 98.6 F (37 C)   Wt 222 lb (100.7 kg)   LMP  (LMP Unknown)   SpO2 100%   BMI 36.38 kg/m    Wt Readings from Last 3 Encounters:  03/30/24 222 lb (100.7 kg)  04/15/23 196 lb 12.8 oz (89.3 kg)  03/15/23 187  lb 12.8 oz (85.2 kg)    General appearence: alert, no distress, WD/WN,  Neck: supple, no lymphadenopathy, no thyromegaly, no masses Heart: RRR, normal S1, S2, no murmurs Lungs: CTA bilaterally, no wheezes, rhonchi, or rales Abdomen: +bs, soft, non tender, non distended, no masses, no hepatomegaly, no splenomegaly Back: No CVA tenderness Extremities: no edema, no cyanosis, no clubbing Pulses: 2+ symmetric, upper and lower extremities, normal cap refill     ASSESSMENT/PLAN:   Encounter Diagnoses  Name Primary?   Frequency of urination Yes   Abnormal urine odor    Screen for STD (sexually transmitted disease)    Diabetes mellitus type 2 with complications (HCC)    Essential hypertension    Subacute vaginitis    Morbid obesity (HCC)    Heart murmur    Aortic valve sclerosis    Urine frequency, urine odor-urine culture sent, begin Bactrim  empirically for possible UTI.    Given her slight white discharge that she complains of of vaginal itching, begin Diflucan  as well.    STD screen as below  Diabetes-restart Mounjaro   Morbid obesity associated with diabetes-restart Mounjaro .  Work on efforts to lose weight through  healthy eating habits and regular exercise  Hypertension-continue lisinopril  HCT 20/12.5 mg daily  Hyperlipidemia-continue atorvastatin  40 mg daily.  Aortic sclerosis, atrial enlargement, heart murmur -Referred back to cardiology   Kathryn [name] was seen today for acute visit.  Diagnoses and all orders for this visit:  Frequency of urination -     POCT Urinalysis DIP (Proadvantage Device) -     Urine Culture  Abnormal urine odor -     Urine Culture  Screen for STD (sexually transmitted disease) -     Chlamydia/Gonococcus/Trichomonas, NAA -     HIV Antibody (routine testing w rflx) -     RPR W/RFLX TO RPR TITER, TREPONEMAL AB, SCREEN AND DIAGNOSIS  Diabetes mellitus type 2 with complications (HCC) -     Hemoglobin A1c  Essential hypertension -      Basic metabolic panel with GFR  Subacute vaginitis  Morbid obesity (HCC)  Heart murmur -     Ambulatory referral to Cardiology  Aortic valve sclerosis -     Ambulatory referral to Cardiology  Other orders -     sulfamethoxazole -trimethoprim  (BACTRIM  DS) 800-160 MG tablet; Take 1 tablet by mouth 2 (two) times daily. -     fluconazole  (DIFLUCAN ) 150 MG tablet; Take 1 tablet (150 mg total) by mouth once a week.   F/u pending culture   [facility]    [1]  Current Outpatient Medications on File Prior to Visit  Medication Sig Dispense Refill   cholecalciferol (VITAMIN D3) 25 MCG (1000 UNIT) tablet Take 1 tablet (1,000 Units total) by mouth daily. 90 tablet 3   lidocaine  (XYLOCAINE ) 2 % solution Use as directed 5-15 mLs in the mouth or throat every 4 (four) hours as needed for mouth pain. 60 mL 0   Insulin Pen Needle (B-D UF III MINI PEN NEEDLES) 31G X 5 MM MISC 1 EACH BY DOES NOT APPLY ROUTE AS DIRECTED. 100 each 11   No current facility-administered medications on file prior to visit.   "

## 2024-03-31 ENCOUNTER — Other Ambulatory Visit: Payer: Self-pay | Admitting: Medical

## 2024-03-31 ENCOUNTER — Ambulatory Visit: Payer: Self-pay | Admitting: Medical

## 2024-03-31 DIAGNOSIS — I358 Other nonrheumatic aortic valve disorders: Secondary | ICD-10-CM | POA: Insufficient documentation

## 2024-03-31 LAB — BASIC METABOLIC PANEL WITH GFR
BUN/Creatinine Ratio: 17 (ref 12–28)
BUN: 18 mg/dL (ref 8–27)
CO2: 20 mmol/L (ref 20–29)
Calcium: 9.5 mg/dL (ref 8.7–10.3)
Chloride: 109 mmol/L — ABNORMAL HIGH (ref 96–106)
Creatinine, Ser: 1.06 mg/dL — ABNORMAL HIGH (ref 0.57–1.00)
Glucose: 79 mg/dL (ref 70–99)
Potassium: 4.2 mmol/L (ref 3.5–5.2)
Sodium: 143 mmol/L (ref 134–144)
eGFR: 56 mL/min/{1.73_m2} — ABNORMAL LOW

## 2024-03-31 LAB — HIV ANTIBODY (ROUTINE TESTING W REFLEX): HIV Screen 4th Generation wRfx: NONREACTIVE

## 2024-03-31 LAB — HEMOGLOBIN A1C
Est. average glucose Bld gHb Est-mCnc: 120 mg/dL
Hgb A1c MFr Bld: 5.8 % — ABNORMAL HIGH (ref 4.8–5.6)

## 2024-03-31 LAB — SYPHILIS: RPR W/REFLEX TO RPR TITER AND TREPONEMAL ANTIBODIES, TRADITIONAL SCREENING AND DIAGNOSIS ALGORITHM: RPR Ser Ql: NONREACTIVE

## 2024-03-31 MED ORDER — MOUNJARO 5 MG/0.5ML ~~LOC~~ SOAJ: 5.0000 mg | SUBCUTANEOUS | 0 refills | Status: AC

## 2024-03-31 MED ORDER — LEVOTHYROXINE SODIUM 25 MCG PO TABS: 25.0000 ug | ORAL_TABLET | Freq: Every day | ORAL | 1 refills | Status: AC

## 2024-03-31 MED ORDER — ATORVASTATIN CALCIUM 40 MG PO TABS: 40.0000 mg | ORAL_TABLET | Freq: Every day | ORAL | 3 refills | Status: AC

## 2024-03-31 MED ORDER — LISINOPRIL-HYDROCHLOROTHIAZIDE 20-12.5 MG PO TABS: 1.0000 | ORAL_TABLET | Freq: Every day | ORAL | 3 refills | Status: AC

## 2024-03-31 NOTE — Progress Notes (Signed)
 Still pending urine culture and chlamydia and gonorrhea test.  Kidney marker shows chronic kidney disease but it is stable thankfully.  Diabetes marker stable at 5.8%.  HIV negative, syphilis negative.  Lets restart Mounjaro  at a lower dose since she has been off for a while.  This is to help control blood sugars and weight loss.
